# Patient Record
Sex: Female | Born: 1940
Health system: Southern US, Community
[De-identification: ages and names within clinical notes are randomized; demographics above are authoritative.]

## PROBLEM LIST (undated history)

## (undated) DIAGNOSIS — E039 Hypothyroidism, unspecified: Secondary | ICD-10-CM

## (undated) DIAGNOSIS — I10 Essential (primary) hypertension: Secondary | ICD-10-CM

## (undated) DIAGNOSIS — M810 Age-related osteoporosis without current pathological fracture: Secondary | ICD-10-CM

## (undated) DIAGNOSIS — R0602 Shortness of breath: Secondary | ICD-10-CM

## (undated) DIAGNOSIS — R112 Nausea with vomiting, unspecified: Secondary | ICD-10-CM

## (undated) DIAGNOSIS — C801 Malignant (primary) neoplasm, unspecified: Secondary | ICD-10-CM

## (undated) DIAGNOSIS — M109 Gout, unspecified: Secondary | ICD-10-CM

## (undated) DIAGNOSIS — N390 Urinary tract infection, site not specified: Secondary | ICD-10-CM

## (undated) DIAGNOSIS — Z8719 Personal history of other diseases of the digestive system: Secondary | ICD-10-CM

## (undated) DIAGNOSIS — N189 Chronic kidney disease, unspecified: Secondary | ICD-10-CM

## (undated) DIAGNOSIS — K589 Irritable bowel syndrome without diarrhea: Secondary | ICD-10-CM

## (undated) DIAGNOSIS — D649 Anemia, unspecified: Secondary | ICD-10-CM

## (undated) DIAGNOSIS — H9190 Unspecified hearing loss, unspecified ear: Secondary | ICD-10-CM

## (undated) DIAGNOSIS — E785 Hyperlipidemia, unspecified: Secondary | ICD-10-CM

## (undated) DIAGNOSIS — M1990 Unspecified osteoarthritis, unspecified site: Secondary | ICD-10-CM

## (undated) DIAGNOSIS — Z9889 Other specified postprocedural states: Secondary | ICD-10-CM

## (undated) HISTORY — PX: ACHILLES TENDON SURGERY: SHX542

## (undated) HISTORY — PX: EYE SURGERY: SHX253

## (undated) HISTORY — PX: DILATION AND CURETTAGE OF UTERUS: SHX78

## (undated) HISTORY — DX: Gout, unspecified: M10.9

## (undated) HISTORY — PX: BREAST LUMPECTOMY: SHX2

## (undated) HISTORY — DX: Age-related osteoporosis without current pathological fracture: M81.0

## (undated) HISTORY — PX: TONSILLECTOMY: SUR1361

## (undated) HISTORY — PX: SKIN CANCER EXCISION: SHX779

---

## 2008-05-29 ENCOUNTER — Encounter (HOSPITAL_BASED_OUTPATIENT_CLINIC_OR_DEPARTMENT_OTHER): Payer: Self-pay | Admitting: General Surgery

## 2008-05-29 ENCOUNTER — Ambulatory Visit (HOSPITAL_COMMUNITY): Admission: RE | Admit: 2008-05-29 | Discharge: 2008-05-29 | Payer: Self-pay | Admitting: General Surgery

## 2008-06-25 ENCOUNTER — Ambulatory Visit (HOSPITAL_BASED_OUTPATIENT_CLINIC_OR_DEPARTMENT_OTHER): Payer: Medicare Other | Admitting: Oncology

## 2008-07-04 ENCOUNTER — Ambulatory Visit: Admission: RE | Admit: 2008-07-04 | Discharge: 2008-09-15 | Payer: Self-pay | Admitting: Radiation Oncology

## 2008-07-09 LAB — CBC WITH DIFFERENTIAL/PLATELET
BASO%: 0.5 % (ref 0.0–2.0)
EOS%: 3.5 % (ref 0.0–7.0)
MCH: 29.7 pg (ref 26.0–34.0)
MCHC: 34.1 g/dL (ref 32.0–36.0)
RBC: 3.64 10*6/uL — ABNORMAL LOW (ref 3.70–5.32)
RDW: 14.9 % — ABNORMAL HIGH (ref 11.3–14.5)
lymph#: 1.5 10*3/uL (ref 0.9–3.3)

## 2008-07-10 LAB — LACTATE DEHYDROGENASE: LDH: 176 U/L (ref 94–250)

## 2008-07-10 LAB — COMPREHENSIVE METABOLIC PANEL
ALT: 14 U/L (ref 0–35)
AST: 12 U/L (ref 0–37)
CO2: 23 mEq/L (ref 19–32)
Creatinine, Ser: 1.39 mg/dL — ABNORMAL HIGH (ref 0.40–1.20)
Sodium: 138 mEq/L (ref 135–145)
Total Bilirubin: 0.3 mg/dL (ref 0.3–1.2)
Total Protein: 7.2 g/dL (ref 6.0–8.3)

## 2008-07-16 ENCOUNTER — Ambulatory Visit (HOSPITAL_COMMUNITY): Admission: RE | Admit: 2008-07-16 | Discharge: 2008-07-16 | Payer: Self-pay | Admitting: Oncology

## 2008-10-17 ENCOUNTER — Ambulatory Visit: Payer: Self-pay | Admitting: Oncology

## 2008-12-09 ENCOUNTER — Ambulatory Visit: Payer: Self-pay | Admitting: Oncology

## 2009-02-25 ENCOUNTER — Encounter: Admission: RE | Admit: 2009-02-25 | Discharge: 2009-02-25 | Payer: Self-pay | Admitting: Endocrinology

## 2009-04-07 ENCOUNTER — Ambulatory Visit: Payer: Self-pay | Admitting: Oncology

## 2009-04-09 LAB — CBC WITH DIFFERENTIAL/PLATELET
Basophils Absolute: 0 10*3/uL (ref 0.0–0.1)
Eosinophils Absolute: 0.1 10*3/uL (ref 0.0–0.5)
HCT: 32.1 % — ABNORMAL LOW (ref 34.8–46.6)
HGB: 11.3 g/dL — ABNORMAL LOW (ref 11.6–15.9)
MCV: 84.7 fL (ref 79.5–101.0)
MONO%: 11 % (ref 0.0–14.0)
NEUT#: 3.6 10*3/uL (ref 1.5–6.5)
Platelets: 282 10*3/uL (ref 145–400)
RDW: 15.9 % — ABNORMAL HIGH (ref 11.2–14.5)

## 2009-04-09 LAB — COMPREHENSIVE METABOLIC PANEL
Albumin: 3.5 g/dL (ref 3.5–5.2)
Alkaline Phosphatase: 64 U/L (ref 39–117)
BUN: 33 mg/dL — ABNORMAL HIGH (ref 6–23)
Calcium: 9.8 mg/dL (ref 8.4–10.5)
Chloride: 104 mEq/L (ref 96–112)
Glucose, Bld: 118 mg/dL — ABNORMAL HIGH (ref 70–99)
Potassium: 4.4 mEq/L (ref 3.5–5.3)

## 2009-04-10 LAB — CANCER ANTIGEN 27.29: CA 27.29: 48 U/mL — ABNORMAL HIGH (ref 0–39)

## 2009-04-10 LAB — VITAMIN D 25 HYDROXY (VIT D DEFICIENCY, FRACTURES): Vit D, 25-Hydroxy: 29 ng/mL — ABNORMAL LOW (ref 30–89)

## 2009-09-16 ENCOUNTER — Encounter: Admission: RE | Admit: 2009-09-16 | Discharge: 2009-10-28 | Payer: Self-pay | Admitting: General Surgery

## 2009-10-12 ENCOUNTER — Ambulatory Visit: Payer: Self-pay | Admitting: Oncology

## 2009-10-14 LAB — COMPREHENSIVE METABOLIC PANEL
ALT: 22 U/L (ref 0–35)
AST: 28 U/L (ref 0–37)
Alkaline Phosphatase: 68 U/L (ref 39–117)
Sodium: 136 mEq/L (ref 135–145)
Total Bilirubin: 0.7 mg/dL (ref 0.3–1.2)
Total Protein: 7.9 g/dL (ref 6.0–8.3)

## 2009-10-14 LAB — CBC WITH DIFFERENTIAL/PLATELET
BASO%: 0.5 % (ref 0.0–2.0)
EOS%: 2.6 % (ref 0.0–7.0)
LYMPH%: 21.4 % (ref 14.0–49.7)
MCHC: 33.6 g/dL (ref 31.5–36.0)
MCV: 87.3 fL (ref 79.5–101.0)
MONO%: 8.7 % (ref 0.0–14.0)
Platelets: 251 10*3/uL (ref 145–400)
RBC: 3.91 10*6/uL (ref 3.70–5.45)
RDW: 15.3 % — ABNORMAL HIGH (ref 11.2–14.5)

## 2009-10-15 LAB — CANCER ANTIGEN 27.29: CA 27.29: 41 U/mL — ABNORMAL HIGH (ref 0–39)

## 2009-10-31 ENCOUNTER — Encounter: Admission: RE | Admit: 2009-10-31 | Discharge: 2009-11-12 | Payer: Self-pay | Admitting: General Surgery

## 2010-04-14 ENCOUNTER — Ambulatory Visit: Payer: Self-pay | Admitting: Oncology

## 2010-04-16 LAB — COMPREHENSIVE METABOLIC PANEL
AST: 25 U/L (ref 0–37)
Albumin: 3.9 g/dL (ref 3.5–5.2)
Alkaline Phosphatase: 65 U/L (ref 39–117)
BUN: 37 mg/dL — ABNORMAL HIGH (ref 6–23)
Creatinine, Ser: 1.4 mg/dL — ABNORMAL HIGH (ref 0.40–1.20)
Glucose, Bld: 116 mg/dL — ABNORMAL HIGH (ref 70–99)
Potassium: 4.8 mEq/L (ref 3.5–5.3)
Total Bilirubin: 0.3 mg/dL (ref 0.3–1.2)

## 2010-04-16 LAB — CBC WITH DIFFERENTIAL/PLATELET
Basophils Absolute: 0 10*3/uL (ref 0.0–0.1)
EOS%: 0.5 % (ref 0.0–7.0)
Eosinophils Absolute: 0 10*3/uL (ref 0.0–0.5)
HGB: 11 g/dL — ABNORMAL LOW (ref 11.6–15.9)
LYMPH%: 25.4 % (ref 14.0–49.7)
MCH: 29.6 pg (ref 25.1–34.0)
MCV: 85.2 fL (ref 79.5–101.0)
MONO%: 11.6 % (ref 0.0–14.0)
NEUT#: 4.4 10*3/uL (ref 1.5–6.5)
NEUT%: 62.3 % (ref 38.4–76.8)
Platelets: 310 10*3/uL (ref 145–400)
RDW: 15.2 % — ABNORMAL HIGH (ref 11.2–14.5)

## 2010-04-16 LAB — VITAMIN D 25 HYDROXY (VIT D DEFICIENCY, FRACTURES): Vit D, 25-Hydroxy: 29 ng/mL — ABNORMAL LOW (ref 30–89)

## 2010-04-16 LAB — CANCER ANTIGEN 27.29: CA 27.29: 43 U/mL — ABNORMAL HIGH (ref 0–39)

## 2010-11-12 ENCOUNTER — Ambulatory Visit: Payer: Self-pay | Admitting: Oncology

## 2010-11-16 LAB — CBC WITH DIFFERENTIAL/PLATELET
BASO%: 0.3 % (ref 0.0–2.0)
Basophils Absolute: 0 10*3/uL (ref 0.0–0.1)
EOS%: 1.9 % (ref 0.0–7.0)
Eosinophils Absolute: 0.1 10*3/uL (ref 0.0–0.5)
HCT: 31.4 % — ABNORMAL LOW (ref 34.8–46.6)
HGB: 10.8 g/dL — ABNORMAL LOW (ref 11.6–15.9)
LYMPH%: 25.4 % (ref 14.0–49.7)
MCH: 29.2 pg (ref 25.1–34.0)
MCHC: 34.3 g/dL (ref 31.5–36.0)
MCV: 85.3 fL (ref 79.5–101.0)
MONO#: 0.5 10*3/uL (ref 0.1–0.9)
MONO%: 9.8 % (ref 0.0–14.0)
NEUT#: 3.4 10*3/uL (ref 1.5–6.5)
NEUT%: 62.6 % (ref 38.4–76.8)
Platelets: 243 10*3/uL (ref 145–400)
RBC: 3.69 10*6/uL — ABNORMAL LOW (ref 3.70–5.45)
RDW: 16.2 % — ABNORMAL HIGH (ref 11.2–14.5)
WBC: 5.4 10*3/uL (ref 3.9–10.3)
lymph#: 1.4 10*3/uL (ref 0.9–3.3)

## 2010-11-17 LAB — COMPREHENSIVE METABOLIC PANEL
ALT: 14 U/L (ref 0–35)
AST: 16 U/L (ref 0–37)
Albumin: 4 g/dL (ref 3.5–5.2)
Alkaline Phosphatase: 79 U/L (ref 39–117)
BUN: 24 mg/dL — ABNORMAL HIGH (ref 6–23)
CO2: 26 mEq/L (ref 19–32)
Calcium: 10.2 mg/dL (ref 8.4–10.5)
Chloride: 105 mEq/L (ref 96–112)
Creatinine, Ser: 1.29 mg/dL — ABNORMAL HIGH (ref 0.40–1.20)
Glucose, Bld: 103 mg/dL — ABNORMAL HIGH (ref 70–99)
Potassium: 4.5 mEq/L (ref 3.5–5.3)
Sodium: 140 mEq/L (ref 135–145)
Total Bilirubin: 0.4 mg/dL (ref 0.3–1.2)
Total Protein: 7.1 g/dL (ref 6.0–8.3)

## 2010-11-17 LAB — LACTATE DEHYDROGENASE: LDH: 231 U/L (ref 94–250)

## 2010-11-17 LAB — VITAMIN D 25 HYDROXY (VIT D DEFICIENCY, FRACTURES): Vit D, 25-Hydroxy: 25 ng/mL — ABNORMAL LOW (ref 30–89)

## 2010-11-17 LAB — CANCER ANTIGEN 27.29: CA 27.29: 35 U/mL (ref 0–39)

## 2010-11-22 ENCOUNTER — Encounter (HOSPITAL_BASED_OUTPATIENT_CLINIC_OR_DEPARTMENT_OTHER): Payer: Self-pay | Admitting: General Surgery

## 2010-12-06 ENCOUNTER — Encounter: Payer: Medicare Other | Admitting: Oncology

## 2010-12-06 DIAGNOSIS — C50219 Malignant neoplasm of upper-inner quadrant of unspecified female breast: Secondary | ICD-10-CM

## 2010-12-06 DIAGNOSIS — Z170 Estrogen receptor positive status [ER+]: Secondary | ICD-10-CM

## 2011-03-15 NOTE — Op Note (Signed)
Autumn Chambers, Autumn Chambers             ACCOUNT NO.:  [id number]   MEDICAL RECORD NO.:  [id number]          PATIENT TYPE:  AMB   LOCATION:  SDS                          FACILITY:  [facility]   PHYSICIAN:  Leonie Man, M.D.   DATE OF BIRTH:  1941/03/31   DATE OF PROCEDURE:  05/29/2008  DATE OF DISCHARGE:                               OPERATIVE REPORT   PREOPERATIVE DIAGNOSIS:  Carcinoma of left breast.   POSTOPERATIVE DIAGNOSIS:  Carcinoma of left breast.   PROCEDURE:  Blue dye injection with needle-localized left lumpectomy and  left sentinel lymph node biopsy.   SURGEON:  Leonie Man, MD.   ASSISTANT:  OR nurse.   ANESTHESIA:  General.   SURGICAL FINDINGS:  Left axillary sentinel nodes x2, negative for touch  prep for metastatic carcinoma.  Excision of left breast lesion with a  clip and wire intact margins are pending.   SPECIMENS TO PATHOLOGY:  Breast tissue and left axillary nodes.   ESTIMATED BLOOD LOSS:  Minimal.   COMPLICATIONS:  None.   The patient returned to PACU in excellent condition.   INDICATIONS:  Autumn Chambers is a 70 year old nulliparous female with a  screen detected left-sided breast lesion which on biopsy shows an  invasive mammary carcinoma.  The patient comes to the operating room now  after risks and potential benefits of surgery have been fully discussed.  All questions answered.  Consent for surgery obtained.   Following induction of satisfactory general anesthesia and following the  injection of technetium sulfur colloid by the Radiology Department, I  prepped the periareolar region on the left side and infiltrated that  area with diluted methylene blue solution and this was massaged into the  breast tissues.  The left breast and axilla were then prepped and draped  to be included in a sterile operative field.  The patient was identified  as Autumn Chambers.  Operation to be done is blue dye injection and  needle-localized left lumpectomy with  sentinel lymph node biopsy.  All  surgical preoperative precautions were verified.  On looking at the  breast, her prior needle-localized site had an area of erythema and a  small skin ulceration around it.  I chose to take this along with an  ellipse of skin over the localizing needle and this skin was removed and  discarded.  A superior medial flap and the lateral flap were raised  around the needle and the breast tissue taken a wide wedge around the  localizing needle taking this all the way down to the pectoralis major  muscle.  This was removed in its entirety and forwarded for specimen  mammography.  Consultation with Dr. Yolanda Bonine was noted to be quite  adequate.  The breast wound was then packed, and attention was then  turned to the left axillary area with the use of the NeoProbe.  A hot  spot within the axilla was identified.  I made a transverse axillary  incision, deepened this through skin and subcutaneous tissues and  dissecting down to the axilla.  Two hot but not blue sentinel nodes were  encountered.  These  were removed and forwarded for pathologic  evaluation.  Touch preps of these nodes did not show any metastatic  carcinoma.  Additional searching within the axilla for hot spots did not  reveal any additional spots with high counts above background.  Both the  axillary and breast wounds were thoroughly irrigated with normal saline.  Sponge and instrument counts were doubly verified.  The breast wound was  closed in 2 layers with interrupted 3-0 Vicryl sutures to close the  breast tissue and subcutaneous tissues and the skin was closed with a 6-  0 Monocryl reinforced with Dermabond and Steri-Strips.  The axillary  room was similarly closed with interrupted 3-0 Vicryl sutures and then  with a 6-0 Monocryl suture.  Dermabond and Steri-Strips applied.  Sterile dressings applied to both wounds.  The anesthetic reversed.  The  patient removed from the operating room to the  recovery room in stable  condition.  She tolerated the procedure well.      Leonie Man, M.D.  Electronically Signed     PB/MEDQ  D:  05/29/2008  T:  05/30/2008  Job:  [id number]

## 2011-05-31 ENCOUNTER — Encounter (HOSPITAL_BASED_OUTPATIENT_CLINIC_OR_DEPARTMENT_OTHER): Payer: Medicare Other | Admitting: Oncology

## 2011-05-31 ENCOUNTER — Other Ambulatory Visit: Payer: Self-pay | Admitting: Oncology

## 2011-05-31 DIAGNOSIS — Z170 Estrogen receptor positive status [ER+]: Secondary | ICD-10-CM

## 2011-05-31 DIAGNOSIS — C50219 Malignant neoplasm of upper-inner quadrant of unspecified female breast: Secondary | ICD-10-CM

## 2011-05-31 LAB — CBC WITH DIFFERENTIAL/PLATELET
BASO%: 0.3 % (ref 0.0–2.0)
Basophils Absolute: 0 10*3/uL (ref 0.0–0.1)
EOS%: 2.1 % (ref 0.0–7.0)
Eosinophils Absolute: 0.1 10*3/uL (ref 0.0–0.5)
HCT: 31.7 % — ABNORMAL LOW (ref 34.8–46.6)
HGB: 10.7 g/dL — ABNORMAL LOW (ref 11.6–15.9)
LYMPH%: 20.1 % (ref 14.0–49.7)
MCH: 29.8 pg (ref 25.1–34.0)
MCHC: 33.9 g/dL (ref 31.5–36.0)
MCV: 87.7 fL (ref 79.5–101.0)
MONO#: 0.5 10*3/uL (ref 0.1–0.9)
MONO%: 9.2 % (ref 0.0–14.0)
NEUT#: 3.8 10*3/uL (ref 1.5–6.5)
NEUT%: 68.3 % (ref 38.4–76.8)
Platelets: 235 10*3/uL (ref 145–400)
RBC: 3.61 10*6/uL — ABNORMAL LOW (ref 3.70–5.45)
RDW: 14.3 % (ref 11.2–14.5)
WBC: 5.6 10*3/uL (ref 3.9–10.3)
lymph#: 1.1 10*3/uL (ref 0.9–3.3)

## 2011-05-31 LAB — COMPREHENSIVE METABOLIC PANEL
AST: 18 U/L (ref 0–37)
Albumin: 3.7 g/dL (ref 3.5–5.2)
Alkaline Phosphatase: 78 U/L (ref 39–117)
Calcium: 9.2 mg/dL (ref 8.4–10.5)
Chloride: 106 mEq/L (ref 96–112)
Glucose, Bld: 140 mg/dL — ABNORMAL HIGH (ref 70–99)
Potassium: 4.4 mEq/L (ref 3.5–5.3)
Sodium: 137 mEq/L (ref 135–145)
Total Protein: 6.8 g/dL (ref 6.0–8.3)

## 2011-05-31 LAB — LACTATE DEHYDROGENASE: LDH: 173 U/L (ref 94–250)

## 2011-05-31 LAB — VITAMIN D 25 HYDROXY (VIT D DEFICIENCY, FRACTURES): Vit D, 25-Hydroxy: 21 ng/mL — ABNORMAL LOW (ref 30–89)

## 2011-06-07 ENCOUNTER — Encounter: Payer: Medicare Other | Admitting: Oncology

## 2011-07-29 LAB — URINALYSIS, ROUTINE W REFLEX MICROSCOPIC
Bilirubin Urine: NEGATIVE
Glucose, UA: NEGATIVE
Ketones, ur: NEGATIVE
pH: 6

## 2011-07-29 LAB — CBC
HCT: 34.4 — ABNORMAL LOW
Hemoglobin: 11.5 — ABNORMAL LOW
MCHC: 33.4
MCV: 88
RBC: 3.91
WBC: 7.1

## 2011-07-29 LAB — DIFFERENTIAL
Basophils Relative: 0
Eosinophils Absolute: 0.2
Monocytes Absolute: 0.8
Monocytes Relative: 11
Neutrophils Relative %: 57

## 2011-07-29 LAB — COMPREHENSIVE METABOLIC PANEL
BUN: 17
CO2: 26
Calcium: 9.4
Chloride: 111
Creatinine, Ser: 1.31 — ABNORMAL HIGH
GFR calc Af Amer: 49 — ABNORMAL LOW
GFR calc non Af Amer: 41 — ABNORMAL LOW
Glucose, Bld: 102 — ABNORMAL HIGH
Total Bilirubin: 0.6

## 2011-11-02 DIAGNOSIS — H40009 Preglaucoma, unspecified, unspecified eye: Secondary | ICD-10-CM | POA: Diagnosis not present

## 2011-11-02 DIAGNOSIS — E119 Type 2 diabetes mellitus without complications: Secondary | ICD-10-CM | POA: Diagnosis not present

## 2011-11-02 DIAGNOSIS — Z961 Presence of intraocular lens: Secondary | ICD-10-CM | POA: Diagnosis not present

## 2011-11-04 ENCOUNTER — Other Ambulatory Visit: Payer: Self-pay | Admitting: *Deleted

## 2011-11-04 DIAGNOSIS — C50419 Malignant neoplasm of upper-outer quadrant of unspecified female breast: Secondary | ICD-10-CM

## 2011-11-04 MED ORDER — TAMOXIFEN CITRATE 20 MG PO TABS: 20.0000 mg | ORAL_TABLET | Freq: Every day | ORAL | Status: AC

## 2011-11-04 NOTE — Telephone Encounter (Signed)
Pt. Called for refill of Arimidex.  Discussed with Dr. Donnie Coffin.  Pt. Had arimidex filled #90  On 11/05/10.  She then saw Dr. Donnie Coffin on 12/06/10 and he changed her to tamoxifen which she had filled on that date #90.  She has not had medications refilled since then, but thinks she is still on the arimidex.  Called pt. And per Dr. Donnie Coffin, she should be on the tamoxifen.  She verbalized understanding and script for tamoxifen called in.

## 2011-11-09 DIAGNOSIS — E669 Obesity, unspecified: Secondary | ICD-10-CM | POA: Diagnosis not present

## 2011-11-09 DIAGNOSIS — I10 Essential (primary) hypertension: Secondary | ICD-10-CM | POA: Diagnosis not present

## 2011-11-09 DIAGNOSIS — E039 Hypothyroidism, unspecified: Secondary | ICD-10-CM | POA: Diagnosis not present

## 2011-11-09 DIAGNOSIS — E119 Type 2 diabetes mellitus without complications: Secondary | ICD-10-CM | POA: Diagnosis not present

## 2011-11-28 DIAGNOSIS — M1710 Unilateral primary osteoarthritis, unspecified knee: Secondary | ICD-10-CM | POA: Diagnosis not present

## 2011-12-10 DIAGNOSIS — M1990 Unspecified osteoarthritis, unspecified site: Secondary | ICD-10-CM | POA: Diagnosis not present

## 2011-12-10 DIAGNOSIS — Z7901 Long term (current) use of anticoagulants: Secondary | ICD-10-CM | POA: Diagnosis not present

## 2011-12-19 ENCOUNTER — Other Ambulatory Visit (HOSPITAL_COMMUNITY): Payer: Self-pay | Admitting: Orthopedic Surgery

## 2011-12-21 ENCOUNTER — Encounter (HOSPITAL_COMMUNITY): Payer: Self-pay | Admitting: Pharmacy Technician

## 2011-12-21 ENCOUNTER — Encounter (HOSPITAL_COMMUNITY)
Admission: RE | Admit: 2011-12-21 | Discharge: 2011-12-21 | Disposition: A | Discharge status: Home / Self Care | Payer: Medicare Other | Source: Ambulatory Visit | Attending: Orthopedic Surgery | Admitting: Orthopedic Surgery

## 2011-12-21 ENCOUNTER — Encounter (HOSPITAL_COMMUNITY): Payer: Self-pay

## 2011-12-21 ENCOUNTER — Other Ambulatory Visit: Payer: Self-pay

## 2011-12-21 DIAGNOSIS — M1710 Unilateral primary osteoarthritis, unspecified knee: Secondary | ICD-10-CM | POA: Diagnosis not present

## 2011-12-21 DIAGNOSIS — M47814 Spondylosis without myelopathy or radiculopathy, thoracic region: Secondary | ICD-10-CM | POA: Diagnosis not present

## 2011-12-21 DIAGNOSIS — E039 Hypothyroidism, unspecified: Secondary | ICD-10-CM | POA: Diagnosis not present

## 2011-12-21 DIAGNOSIS — Z01811 Encounter for preprocedural respiratory examination: Secondary | ICD-10-CM | POA: Diagnosis not present

## 2011-12-21 DIAGNOSIS — I10 Essential (primary) hypertension: Secondary | ICD-10-CM | POA: Diagnosis not present

## 2011-12-21 DIAGNOSIS — E119 Type 2 diabetes mellitus without complications: Secondary | ICD-10-CM | POA: Diagnosis not present

## 2011-12-21 HISTORY — DX: Hyperlipidemia, unspecified: E78.5

## 2011-12-21 HISTORY — DX: Personal history of other diseases of the digestive system: Z87.19

## 2011-12-21 HISTORY — DX: Other specified postprocedural states: Z98.890

## 2011-12-21 HISTORY — DX: Malignant (primary) neoplasm, unspecified: C80.1

## 2011-12-21 HISTORY — DX: Irritable bowel syndrome, unspecified: K58.9

## 2011-12-21 HISTORY — DX: Urinary tract infection, site not specified: N39.0

## 2011-12-21 HISTORY — DX: Shortness of breath: R06.02

## 2011-12-21 HISTORY — DX: Hypothyroidism, unspecified: E03.9

## 2011-12-21 HISTORY — DX: Essential (primary) hypertension: I10

## 2011-12-21 HISTORY — DX: Unspecified osteoarthritis, unspecified site: M19.90

## 2011-12-21 HISTORY — DX: Anemia, unspecified: D64.9

## 2011-12-21 HISTORY — DX: Other specified postprocedural states: R11.2

## 2011-12-21 LAB — COMPREHENSIVE METABOLIC PANEL
AST: 17 U/L (ref 0–37)
CO2: 23 mEq/L (ref 19–32)
Calcium: 9.4 mg/dL (ref 8.4–10.5)
Creatinine, Ser: 1.43 mg/dL — ABNORMAL HIGH (ref 0.50–1.10)
GFR calc Af Amer: 42 mL/min — ABNORMAL LOW (ref >90)
GFR calc non Af Amer: 36 mL/min — ABNORMAL LOW (ref >90)
Sodium: 136 mEq/L (ref 135–145)
Total Protein: 7.5 g/dL (ref 6.0–8.3)

## 2011-12-21 LAB — CBC
MCH: 29.8 pg (ref 26.0–34.0)
MCHC: 32.2 g/dL (ref 30.0–36.0)
MCV: 92.5 fL (ref 78.0–100.0)
Platelets: 257 10*3/uL (ref 150–400)
RBC: 3.46 MIL/uL — ABNORMAL LOW (ref 3.87–5.11)

## 2011-12-21 LAB — SURGICAL PCR SCREEN
MRSA, PCR: NEGATIVE
Staphylococcus aureus: POSITIVE — AB

## 2011-12-21 LAB — TYPE AND SCREEN: Antibody Screen: NEGATIVE

## 2011-12-21 LAB — PROTIME-INR: INR: 1.06 (ref 0.00–1.49)

## 2011-12-21 NOTE — Pre-Procedure Instructions (Signed)
20 Autumn Chambers  12/21/2011   Your procedure is scheduled on:  Wednesday January 04, 2012  Report to [facility] at 0845 AM.  Call this number if you have problems the morning of surgery: [phone]   Remember:   Do not eat food:After Midnight.  May have clear liquids: up to 4 Hours before arrival. (up to 4:45am)  Clear liquids include soda, tea, black coffee, apple or grape juice, broth.  Take these medicines the morning of surgery with A SIP OF WATER: none   Do not wear jewelry, make-up or nail polish.  Do not wear lotions, powders, or perfumes. You may wear deodorant.  Do not shave 48 hours prior to surgery.  Do not bring valuables to the hospital.  Contacts, dentures or bridgework may not be worn into surgery.  Leave suitcase in the car. After surgery it may be brought to your room.  For patients admitted to the hospital, checkout time is 11:00 AM the day of discharge.   Patients discharged the day of surgery will not be allowed to drive home.  Name and phone number of your driver: [facility] [phone]  Special Instructions: CHG Shower Use Special Wash: 1/2 bottle night before surgery and 1/2 bottle morning of surgery.   Please read over the following fact sheets that you were given: Pain Booklet, Coughing and Deep Breathing, Blood Transfusion Information, Total Joint Packet, MRSA Information and Surgical Site Infection Prevention

## 2011-12-26 ENCOUNTER — Telehealth: Payer: Self-pay | Admitting: Oncology

## 2011-12-26 DIAGNOSIS — E039 Hypothyroidism, unspecified: Secondary | ICD-10-CM | POA: Diagnosis not present

## 2011-12-26 NOTE — Telephone Encounter (Signed)
lmonvm adviisng the pt of her r/s appts from 12/29/2011 to 01/03/2012 due to the md's schedule

## 2011-12-29 ENCOUNTER — Ambulatory Visit: Payer: Medicare Other | Admitting: Oncology

## 2011-12-29 ENCOUNTER — Other Ambulatory Visit: Payer: Medicare Other | Admitting: Lab

## 2012-01-03 ENCOUNTER — Ambulatory Visit: Payer: BC Managed Care – PPO | Admitting: Physician Assistant

## 2012-01-03 ENCOUNTER — Other Ambulatory Visit: Payer: BC Managed Care – PPO | Admitting: Lab

## 2012-01-03 MED ORDER — CEFAZOLIN SODIUM-DEXTROSE 2-3 GM-% IV SOLR
2.0000 g | INTRAVENOUS | Status: AC
  Administered 2012-01-04: 2 g via INTRAVENOUS
  Filled 2012-01-03: qty 50

## 2012-01-04 ENCOUNTER — Ambulatory Visit (HOSPITAL_COMMUNITY): Payer: Medicare Other

## 2012-01-04 ENCOUNTER — Ambulatory Visit (HOSPITAL_COMMUNITY): Payer: Medicare Other | Admitting: Anesthesiology

## 2012-01-04 ENCOUNTER — Encounter (HOSPITAL_COMMUNITY): Payer: Self-pay | Admitting: *Deleted

## 2012-01-04 ENCOUNTER — Encounter (HOSPITAL_COMMUNITY): Payer: Self-pay | Admitting: Anesthesiology

## 2012-01-04 ENCOUNTER — Encounter (HOSPITAL_COMMUNITY)
Admission: RE | Disposition: A | Discharge status: Skilled Nursing Facility | Payer: Self-pay | Source: Ambulatory Visit | Attending: Orthopedic Surgery

## 2012-01-04 ENCOUNTER — Inpatient Hospital Stay (HOSPITAL_COMMUNITY)
Admission: RE | Admit: 2012-01-04 | Discharge: 2012-01-07 | DRG: 470 | Disposition: A | Discharge status: Skilled Nursing Facility | Payer: Medicare Other | Source: Ambulatory Visit | Attending: Orthopedic Surgery | Admitting: Orthopedic Surgery

## 2012-01-04 DIAGNOSIS — E119 Type 2 diabetes mellitus without complications: Secondary | ICD-10-CM | POA: Diagnosis present

## 2012-01-04 DIAGNOSIS — F411 Generalized anxiety disorder: Secondary | ICD-10-CM | POA: Diagnosis not present

## 2012-01-04 DIAGNOSIS — Z96659 Presence of unspecified artificial knee joint: Secondary | ICD-10-CM | POA: Diagnosis not present

## 2012-01-04 DIAGNOSIS — Z471 Aftercare following joint replacement surgery: Secondary | ICD-10-CM | POA: Diagnosis not present

## 2012-01-04 DIAGNOSIS — Z01812 Encounter for preprocedural laboratory examination: Secondary | ICD-10-CM | POA: Diagnosis not present

## 2012-01-04 DIAGNOSIS — M1710 Unilateral primary osteoarthritis, unspecified knee: Secondary | ICD-10-CM | POA: Diagnosis not present

## 2012-01-04 DIAGNOSIS — IMO0002 Reserved for concepts with insufficient information to code with codable children: Secondary | ICD-10-CM | POA: Diagnosis not present

## 2012-01-04 DIAGNOSIS — Z87440 Personal history of urinary (tract) infections: Secondary | ICD-10-CM | POA: Diagnosis not present

## 2012-01-04 DIAGNOSIS — Z85820 Personal history of malignant melanoma of skin: Secondary | ICD-10-CM | POA: Diagnosis not present

## 2012-01-04 DIAGNOSIS — M25569 Pain in unspecified knee: Secondary | ICD-10-CM | POA: Diagnosis not present

## 2012-01-04 DIAGNOSIS — E039 Hypothyroidism, unspecified: Secondary | ICD-10-CM | POA: Diagnosis not present

## 2012-01-04 DIAGNOSIS — E785 Hyperlipidemia, unspecified: Secondary | ICD-10-CM | POA: Diagnosis not present

## 2012-01-04 DIAGNOSIS — I10 Essential (primary) hypertension: Secondary | ICD-10-CM | POA: Diagnosis present

## 2012-01-04 DIAGNOSIS — Z5189 Encounter for other specified aftercare: Secondary | ICD-10-CM | POA: Diagnosis not present

## 2012-01-04 DIAGNOSIS — S8990XA Unspecified injury of unspecified lower leg, initial encounter: Secondary | ICD-10-CM | POA: Diagnosis not present

## 2012-01-04 DIAGNOSIS — G8918 Other acute postprocedural pain: Secondary | ICD-10-CM | POA: Diagnosis not present

## 2012-01-04 DIAGNOSIS — D649 Anemia, unspecified: Secondary | ICD-10-CM | POA: Diagnosis not present

## 2012-01-04 HISTORY — PX: TOTAL KNEE ARTHROPLASTY: SHX125

## 2012-01-04 LAB — GLUCOSE, CAPILLARY: Glucose-Capillary: 133 mg/dL — ABNORMAL HIGH (ref 70–99)

## 2012-01-04 SURGERY — ARTHROPLASTY, KNEE, TOTAL
Anesthesia: Regional | Site: Knee | Laterality: Right | Wound class: Clean

## 2012-01-04 MED ORDER — ESMOLOL HCL 10 MG/ML IV SOLN
INTRAVENOUS | Status: DC | PRN
Start: 2012-01-04 — End: 2012-01-04
  Administered 2012-01-04: 20 mg via INTRAVENOUS
  Administered 2012-01-04: 10 mg via INTRAVENOUS
  Administered 2012-01-04: 20 mg via INTRAVENOUS

## 2012-01-04 MED ORDER — DROPERIDOL 2.5 MG/ML IJ SOLN
INTRAMUSCULAR | Status: DC | PRN
  Administered 2012-01-04: 0.625 mg via INTRAVENOUS

## 2012-01-04 MED ORDER — IRBESARTAN 300 MG PO TABS
300.0000 mg | ORAL_TABLET | Freq: Every day | ORAL | Status: DC
  Administered 2012-01-04 – 2012-01-06 (×3): 300 mg via ORAL
  Filled 2012-01-04 (×4): qty 1

## 2012-01-04 MED ORDER — MIDAZOLAM HCL 2 MG/2ML IJ SOLN
1.0000 mg | INTRAMUSCULAR | Status: DC | PRN
  Administered 2012-01-04: 2 mg via INTRAVENOUS

## 2012-01-04 MED ORDER — BUPIVACAINE-EPINEPHRINE PF 0.5-1:200000 % IJ SOLN
INTRAMUSCULAR | Status: DC | PRN
  Administered 2012-01-04: 30 mL

## 2012-01-04 MED ORDER — LACTATED RINGERS IV SOLN
INTRAVENOUS | Status: DC
  Administered 2012-01-04: 10:00:00 via INTRAVENOUS

## 2012-01-04 MED ORDER — INSULIN ASPART 100 UNIT/ML ~~LOC~~ SOLN
0.0000 [IU] | Freq: Three times a day (TID) | SUBCUTANEOUS | Status: DC
  Administered 2012-01-04 – 2012-01-05 (×2): 3 [IU] via SUBCUTANEOUS
  Administered 2012-01-07: 2 [IU] via SUBCUTANEOUS
  Filled 2012-01-04: qty 3

## 2012-01-04 MED ORDER — METHOCARBAMOL 500 MG PO TABS
500.0000 mg | ORAL_TABLET | Freq: Four times a day (QID) | ORAL | Status: DC | PRN
  Administered 2012-01-04 – 2012-01-07 (×4): 500 mg via ORAL
  Filled 2012-01-04 (×4): qty 1

## 2012-01-04 MED ORDER — OXYCODONE-ACETAMINOPHEN 5-325 MG PO TABS
1.0000 | ORAL_TABLET | ORAL | Status: DC | PRN
  Administered 2012-01-06 – 2012-01-07 (×4): 2 via ORAL
  Filled 2012-01-04 (×4): qty 2

## 2012-01-04 MED ORDER — ONDANSETRON HCL 4 MG/2ML IJ SOLN: 4.0000 mg | Freq: Four times a day (QID) | INTRAMUSCULAR | Status: DC | PRN

## 2012-01-04 MED ORDER — CEFAZOLIN SODIUM-DEXTROSE 2-3 GM-% IV SOLR
2.0000 g | Freq: Four times a day (QID) | INTRAVENOUS | Status: AC
  Administered 2012-01-04 – 2012-01-05 (×3): 2 g via INTRAVENOUS
  Filled 2012-01-04 (×3): qty 50

## 2012-01-04 MED ORDER — MIDAZOLAM HCL 2 MG/2ML IJ SOLN
INTRAMUSCULAR | Status: AC
  Filled 2012-01-04: qty 2

## 2012-01-04 MED ORDER — HYDROMORPHONE HCL PF 1 MG/ML IJ SOLN
0.2500 mg | INTRAMUSCULAR | Status: DC | PRN
  Administered 2012-01-04 (×2): 0.5 mg via INTRAVENOUS

## 2012-01-04 MED ORDER — WARFARIN SODIUM 10 MG PO TABS
10.0000 mg | ORAL_TABLET | Freq: Once | ORAL | Status: AC
  Administered 2012-01-04: 10 mg via ORAL
  Filled 2012-01-04: qty 1

## 2012-01-04 MED ORDER — FENTANYL CITRATE 0.05 MG/ML IJ SOLN
50.0000 ug | INTRAMUSCULAR | Status: DC | PRN
  Administered 2012-01-04: 100 ug via INTRAVENOUS

## 2012-01-04 MED ORDER — GLYCOPYRROLATE 0.2 MG/ML IJ SOLN
INTRAMUSCULAR | Status: DC | PRN
  Administered 2012-01-04: .7 mg via INTRAVENOUS

## 2012-01-04 MED ORDER — HYDROMORPHONE HCL PF 1 MG/ML IJ SOLN
0.5000 mg | INTRAMUSCULAR | Status: DC | PRN
  Administered 2012-01-04: 0.5 mg via INTRAVENOUS
  Filled 2012-01-04: qty 1

## 2012-01-04 MED ORDER — NEOSTIGMINE METHYLSULFATE 1 MG/ML IJ SOLN
INTRAMUSCULAR | Status: DC | PRN
  Administered 2012-01-04: 4 mg via INTRAVENOUS

## 2012-01-04 MED ORDER — LABETALOL HCL 5 MG/ML IV SOLN
INTRAVENOUS | Status: DC | PRN
  Administered 2012-01-04 (×3): 5 mg via INTRAVENOUS

## 2012-01-04 MED ORDER — TRIAMTERENE-HCTZ 37.5-25 MG PO TABS
1.0000 | ORAL_TABLET | Freq: Every day | ORAL | Status: DC
  Administered 2012-01-05 – 2012-01-07 (×3): 1 via ORAL
  Filled 2012-01-04 (×3): qty 1

## 2012-01-04 MED ORDER — FENTANYL CITRATE 0.05 MG/ML IJ SOLN
INTRAMUSCULAR | Status: DC | PRN
  Administered 2012-01-04: 50 ug via INTRAVENOUS

## 2012-01-04 MED ORDER — FENTANYL CITRATE 0.05 MG/ML IJ SOLN
INTRAMUSCULAR | Status: AC
  Filled 2012-01-04: qty 2

## 2012-01-04 MED ORDER — METOCLOPRAMIDE HCL 5 MG/ML IJ SOLN: 5.0000 mg | Freq: Three times a day (TID) | INTRAMUSCULAR | Status: DC | PRN

## 2012-01-04 MED ORDER — PROPOFOL 10 MG/ML IV EMUL
INTRAVENOUS | Status: DC | PRN
  Administered 2012-01-04: 180 mg via INTRAVENOUS

## 2012-01-04 MED ORDER — METHOCARBAMOL 100 MG/ML IJ SOLN
500.0000 mg | Freq: Four times a day (QID) | INTRAVENOUS | Status: DC | PRN
  Filled 2012-01-04 (×2): qty 5

## 2012-01-04 MED ORDER — SODIUM CHLORIDE 0.9 % IV SOLN
INTRAVENOUS | Status: DC
  Administered 2012-01-04: 16:00:00 via INTRAVENOUS

## 2012-01-04 MED ORDER — FERROUS SULFATE 325 (65 FE) MG PO TABS
325.0000 mg | ORAL_TABLET | Freq: Three times a day (TID) | ORAL | Status: DC
  Administered 2012-01-04 – 2012-01-07 (×9): 325 mg via ORAL
  Filled 2012-01-04 (×11): qty 1

## 2012-01-04 MED ORDER — ROCURONIUM BROMIDE 100 MG/10ML IV SOLN
INTRAVENOUS | Status: DC | PRN
  Administered 2012-01-04: 50 mg via INTRAVENOUS

## 2012-01-04 MED ORDER — WARFARIN - PHARMACIST DOSING INPATIENT: Freq: Every day | Status: DC

## 2012-01-04 MED ORDER — COUMADIN BOOK
Freq: Once | Status: AC
  Administered 2012-01-04: 16:00:00
  Filled 2012-01-04: qty 1

## 2012-01-04 MED ORDER — ONDANSETRON HCL 4 MG/2ML IJ SOLN
INTRAMUSCULAR | Status: DC | PRN
  Administered 2012-01-04: 4 mg via INTRAVENOUS

## 2012-01-04 MED ORDER — LINAGLIPTIN 5 MG PO TABS
5.0000 mg | ORAL_TABLET | Freq: Every day | ORAL | Status: DC
  Administered 2012-01-05 – 2012-01-07 (×3): 5 mg via ORAL
  Filled 2012-01-04 (×4): qty 1

## 2012-01-04 MED ORDER — DIAZEPAM 2 MG PO TABS
2.0000 mg | ORAL_TABLET | Freq: Every day | ORAL | Status: DC
  Administered 2012-01-05 – 2012-01-07 (×3): 2 mg via ORAL
  Filled 2012-01-04 (×3): qty 1

## 2012-01-04 MED ORDER — LEVOTHYROXINE SODIUM 100 MCG PO TABS
100.0000 ug | ORAL_TABLET | Freq: Every day | ORAL | Status: DC
  Administered 2012-01-04 – 2012-01-07 (×4): 100 ug via ORAL
  Filled 2012-01-04 (×5): qty 1

## 2012-01-04 MED ORDER — HYDROCODONE-ACETAMINOPHEN 5-325 MG PO TABS
1.0000 | ORAL_TABLET | ORAL | Status: DC | PRN
  Administered 2012-01-05 – 2012-01-06 (×5): 2 via ORAL
  Filled 2012-01-04 (×5): qty 2

## 2012-01-04 MED ORDER — TAMOXIFEN CITRATE 10 MG PO TABS
20.0000 mg | ORAL_TABLET | Freq: Every day | ORAL | Status: DC
  Administered 2012-01-05 – 2012-01-07 (×3): 20 mg via ORAL
  Filled 2012-01-04 (×4): qty 2

## 2012-01-04 MED ORDER — SODIUM CHLORIDE 0.9 % IR SOLN
Status: DC | PRN
  Administered 2012-01-04: 4000 mL

## 2012-01-04 MED ORDER — ONDANSETRON HCL 4 MG PO TABS: 4.0000 mg | ORAL_TABLET | Freq: Four times a day (QID) | ORAL | Status: DC | PRN

## 2012-01-04 MED ORDER — PIOGLITAZONE HCL 30 MG PO TABS
30.0000 mg | ORAL_TABLET | Freq: Every day | ORAL | Status: DC
  Administered 2012-01-05 – 2012-01-07 (×3): 30 mg via ORAL
  Filled 2012-01-04 (×4): qty 1

## 2012-01-04 MED ORDER — TAMOXIFEN CITRATE 20 MG PO TABS: 20.0000 mg | ORAL_TABLET | Freq: Every day | ORAL | Status: DC

## 2012-01-04 MED ORDER — METOCLOPRAMIDE HCL 10 MG PO TABS: 5.0000 mg | ORAL_TABLET | Freq: Three times a day (TID) | ORAL | Status: DC | PRN

## 2012-01-04 MED ORDER — WARFARIN VIDEO: Freq: Once | Status: DC

## 2012-01-04 MED ORDER — LACTATED RINGERS IV SOLN
INTRAVENOUS | Status: DC | PRN
  Administered 2012-01-04 (×2): via INTRAVENOUS

## 2012-01-04 SURGICAL SUPPLY — 60 items
BANDAGE COBAN STERILE 6 (GAUZE/BANDAGES/DRESSINGS) ×2 IMPLANT
BLADE KNIFE  20 PERSONNA (BLADE) ×2
BLADE KNIFE 20 PERSONNA (BLADE) ×2 IMPLANT
BLADE SAW SAG 90X13X1.27 (BLADE) IMPLANT
BLADE SAW SGTL 13.0X1.19X90.0M (BLADE) ×2 IMPLANT
BNDG COHESIVE 6X5 TAN STRL LF (GAUZE/BANDAGES/DRESSINGS) IMPLANT
BONE CEMENT PALACOSE (Orthopedic Implant) ×4 IMPLANT
BOWL SMART MIX CTS (DISPOSABLE) ×2 IMPLANT
CEMENT BONE PALACOSE (Orthopedic Implant) ×2 IMPLANT
CLOTH BEACON ORANGE TIMEOUT ST (SAFETY) ×2 IMPLANT
COVER BACK TABLE 24X17X13 BIG (DRAPES) ×2 IMPLANT
COVER SURGICAL LIGHT HANDLE (MISCELLANEOUS) ×4 IMPLANT
CUFF TOURNIQUET SINGLE 34IN LL (TOURNIQUET CUFF) IMPLANT
CUFF TOURNIQUET SINGLE 44IN (TOURNIQUET CUFF) IMPLANT
DRAPE EXTREMITY T 121X128X90 (DRAPE) ×2 IMPLANT
DRAPE PROXIMA HALF (DRAPES) ×2 IMPLANT
DRAPE U-SHAPE 47X51 STRL (DRAPES) ×2 IMPLANT
DRSG ADAPTIC 3X8 NADH LF (GAUZE/BANDAGES/DRESSINGS) IMPLANT
DRSG PAD ABDOMINAL 8X10 ST (GAUZE/BANDAGES/DRESSINGS) IMPLANT
DURAPREP 26ML APPLICATOR (WOUND CARE) ×2 IMPLANT
ELECT REM PT RETURN 9FT ADLT (ELECTROSURGICAL) ×2
ELECTRODE REM PT RTRN 9FT ADLT (ELECTROSURGICAL) ×1 IMPLANT
FACESHIELD LNG OPTICON STERILE (SAFETY) ×6 IMPLANT
GAUZE XEROFORM 1X8 LF (GAUZE/BANDAGES/DRESSINGS) ×2 IMPLANT
GLOVE BIO SURGEON STRL SZ8.5 (GLOVE) ×2 IMPLANT
GLOVE BIOGEL PI IND STRL 7.5 (GLOVE) ×1 IMPLANT
GLOVE BIOGEL PI IND STRL 9 (GLOVE) ×1 IMPLANT
GLOVE BIOGEL PI INDICATOR 7.5 (GLOVE) ×1
GLOVE BIOGEL PI INDICATOR 9 (GLOVE) ×1
GLOVE SURG ORTHO 9.0 STRL STRW (GLOVE) ×4 IMPLANT
GLOVE SURG SS PI 7.5 STRL IVOR (GLOVE) ×4 IMPLANT
GLOVE SURG SS PI 8.5 STRL IVOR (GLOVE) ×1
GLOVE SURG SS PI 8.5 STRL STRW (GLOVE) ×1 IMPLANT
GOWN PREVENTION PLUS XLARGE (GOWN DISPOSABLE) ×2 IMPLANT
GOWN SRG XL XLNG 56XLVL 4 (GOWN DISPOSABLE) ×3 IMPLANT
GOWN STRL NON-REIN LRG LVL3 (GOWN DISPOSABLE) ×2 IMPLANT
GOWN STRL NON-REIN XL XLG LVL4 (GOWN DISPOSABLE) ×3
HANDPIECE INTERPULSE COAX TIP (DISPOSABLE) ×1
KIT BASIN OR (CUSTOM PROCEDURE TRAY) ×2 IMPLANT
KIT ROOM TURNOVER OR (KITS) ×2 IMPLANT
MANIFOLD NEPTUNE II (INSTRUMENTS) ×2 IMPLANT
NEEDLE SPNL 18GX3.5 QUINCKE PK (NEEDLE) IMPLANT
NS IRRIG 1000ML POUR BTL (IV SOLUTION) ×2 IMPLANT
PACK TOTAL JOINT (CUSTOM PROCEDURE TRAY) ×2 IMPLANT
PAD ARMBOARD 7.5X6 YLW CONV (MISCELLANEOUS) ×4 IMPLANT
PADDING CAST COTTON 6X4 STRL (CAST SUPPLIES) IMPLANT
SET HNDPC FAN SPRY TIP SCT (DISPOSABLE) ×1 IMPLANT
SPONGE GAUZE 4X4 12PLY (GAUZE/BANDAGES/DRESSINGS) IMPLANT
STAPLER VISISTAT 35W (STAPLE) ×2 IMPLANT
SUCTION FRAZIER TIP 10 FR DISP (SUCTIONS) IMPLANT
SUT VIC AB 0 CTB1 27 (SUTURE) ×2 IMPLANT
SUT VIC AB 1 CTX 36 (SUTURE) ×1
SUT VIC AB 1 CTX36XBRD ANBCTR (SUTURE) ×1 IMPLANT
SUT VIC AB 2-0 CTB1 (SUTURE) ×2 IMPLANT
SYR 50ML SLIP (SYRINGE) ×2 IMPLANT
TOWEL OR 17X24 6PK STRL BLUE (TOWEL DISPOSABLE) ×2 IMPLANT
TOWEL OR 17X26 10 PK STRL BLUE (TOWEL DISPOSABLE) ×2 IMPLANT
TRAY FOLEY CATH 14FR (SET/KITS/TRAYS/PACK) ×2 IMPLANT
WATER STERILE IRR 1000ML POUR (IV SOLUTION) ×4 IMPLANT
WRAP KNEE MAXI GEL POST OP (GAUZE/BANDAGES/DRESSINGS) ×2 IMPLANT

## 2012-01-04 NOTE — Anesthesia Procedure Notes (Addendum)
Anesthesia Regional Block:  Femoral nerve block  Pre-Anesthetic Checklist: ,, timeout performed, Correct Patient, Correct Site, Correct Laterality, Correct Procedure,, site marked, risks and benefits discussed, Surgical consent,  Pre-op evaluation,  At surgeon's request and post-op pain management  Laterality: Right  Prep: chloraprep       Needles:  Injection technique: Single-shot  Needle Type: Echogenic Stimulator Needle     Needle Length: 9cm  Needle Gauge: 21    Additional Needles:  Procedures: ultrasound guided and nerve stimulator Femoral nerve block  Nerve Stimulator or Paresthesia:  Response: Quadriceps muscle contraction, 0.45 mA,   Additional Responses:   Narrative:  Start time: 01/04/2012 9:10 AM End time: 01/04/2012 9:25 AM Injection made incrementally with aspirations every 5 mL.  Performed by: Personally  Anesthesiologist: Dr Chaney Malling  Additional Notes: Functioning IV was confirmed and monitors were applied.  A 90mm 21ga Arrow echogenic stimulator needle was used. Sterile prep and drape,hand hygiene and sterile gloves were used.  Negative aspiration and negative test dose prior to incremental administration of local anesthetic. The patient tolerated the procedure well.    Femoral nerve block Procedure Name: Intubation Date/Time: 01/04/2012 10:35 AM Performed by: Rossie Muskrat Pre-anesthesia Checklist: Patient identified, Timeout performed, Emergency Drugs available and Suction available Patient Re-evaluated:Patient Re-evaluated prior to inductionOxygen Delivery Method: Circle system utilized Preoxygenation: Pre-oxygenation with 100% oxygen Intubation Type: IV induction Ventilation: Mask ventilation without difficulty Laryngoscope Size: Miller and 2 Grade View: Grade III Tube type: Oral Tube size: 7.5 mm Number of attempts: 2 Airway Equipment and Method: Stylet and Bougie stylet Placement Confirmation: breath sounds checked- equal and bilateral and  positive ETCO2 (unable to see cords. Grade 3 view. Bougie used on 2nd attempt w/out difficlty) Secured at: 22 cm Tube secured with: Tape Dental Injury: Teeth and Oropharynx as per pre-operative assessment and Injury to lip

## 2012-01-04 NOTE — Progress Notes (Signed)
ANTICOAGULATION CONSULT NOTE - Initial Consult  Pharmacy Consult for Coumadin Indication: VTE PPX, s/p TKA  Allergies  Allergen Reactions  . Darvon Other (See Comments)    Gets her "high"    Patient Measurements:   Weight = 124 kg  Vital Signs: Temp: 98.6 F (37 C) (03/06 1330) Temp src: Oral (03/06 0843) BP: 129/62 mmHg (03/06 1330) Pulse Rate: 69  (03/06 1330)  Labs: No results found for this basename: HGB:2,HCT:3,PLT:3,APTT:3,LABPROT:3,INR:3,HEPARINUNFRC:3,CREATININE:3,CKTOTAL:3,CKMB:3,TROPONINI:3 in the last 72 hours CrCl is unknown because there is no height on file for the current visit.  Medical History: Past Medical History  Diagnosis Date  . PONV (postoperative nausea and vomiting)   . Hypertension   . Hyperlipidemia   . Shortness of breath     "mild upon exertion"  . Diabetes mellitus     oral  . Hypothyroidism   . Anemia   . Urinary tract infection     hx of  . H/O hiatal hernia   . Irritable bowel   . Cancer     hx of skin ca  . Arthritis     Medications:  Scheduled:    .  ceFAZolin (ANCEF) IV  2 g Intravenous 60 min Pre-Op  .  ceFAZolin (ANCEF) IV  2 g Intravenous Q6H  . diazepam  2 mg Oral Daily  . ferrous sulfate  325 mg Oral TID PC  . insulin aspart  0-15 Units Subcutaneous TID WC  . irbesartan  300 mg Oral QHS  . levothyroxine  100 mcg Oral Daily  . linagliptin  5 mg Oral Daily  . pioglitazone  30 mg Oral Daily  . tamoxifen  20 mg Oral Daily  . triamterene-hydrochlorothiazide  1 each Oral Daily    Assessment: 71 year old s/p TKA, beginning Coumadin for DVT prophylaxis  Goal of Therapy:  INR 2-3   Plan:  1) Coumadin 10 mg po x 1 dose tonight 2) Daily PT / INR  Thank you.  Elwin Sleight 01/04/2012,2:19 PM

## 2012-01-04 NOTE — Progress Notes (Signed)
Pt admitted from PACU s/p R TKR. Knee precations. +cms. Ice brace to R knee. Coban dressing dry and intact to R knee. Pt is lethargic but arousable and alert and oriented x 3. Pt is HOH. Neuro check is otherwise negative. Lungs CTA but diminished in the bases. Pt has a nonprod cough. Pt sats 99% 2lpm. Pt has TEDS on and will be started on Coumadin for DVT. Pt has a foley draining mod amount of clear yellow urine. Pt repts LBM 3/5. Abdomen is soft flat nontender and nondistended. BS hypoactive x 4. Pt denies passing gas or nausea or vomiting.

## 2012-01-04 NOTE — Op Note (Signed)
OPERATIVE REPORT  DATE OF SURGERY: 01/04/2012  PATIENT:  Autumn Chambers,  71 y.o. female  PRE-OPERATIVE DIAGNOSIS:  Osteoarthritis Right Knee  POST-OPERATIVE DIAGNOSIS:  Osteoarthritis Right Knee  PROCEDURE:  Procedure(s): TOTAL KNEE ARTHROPLASTY Zimmer components. Size D. femur. Size 4 tibia. 12 mm polyethylene tray. 29 mm patella. Stem for the tibial component.  SURGEON:  Surgeon(s): Nadara Mustard, MD  ANESTHESIA:   regional and general  EBL:  Minimal ML  SPECIMEN:  No Specimen  TOURNIQUET:  * No tourniquets in log *  PROCEDURE DETAILS: Patient is a 71 year old woman with osteoarthritis of her right knee she has lost range of motion lacks 10 of full extension has pain with activities of daily living she has failed conservative treatment including activity modification assistive devices injections anti-inflammatories all without relief and presents at this time for total knee arthroplasty. Risks and benefits were discussed including infection neurovascular injury persistent pain DVT pulmonary embolus need for additional surgery. Patient states she understands and wished to proceed at this time. Should procedure patient was brought to or room tendon underwent a general anesthetic after femoral block. After adequate levels of anesthesia were obtained patient's right lower extremity was prepped using DuraPrep draped into a sterile field an [place] was used to cover all exposed skin. A midline incision was made carried down to the medial parapatellar retinacular incision. The patella was everted 10 mm was cut from the patella and this was sized for a 29 and the lug cuts were made for the size 29. Attention was focused on the femur 10 mm was taken off the distal femur with 5 of valgus. Attention was then focused on the tibia and 10 mm was taken off the tibia with neutral varus valgus and a 7 posterior slope. The femur was then box cut for a size D the chamfer cuts were made and the trial  implant was placed. The tibia was trialed and a size 4 tibial tray was placed. Patient had very soft bone it was elected to place a keeled to minimize the risk of subsidence of the tibial tray. This was then trialed with a 12 mm poly-tray and the patient's knee was stable. The trial components were removed the wound was irrigated with pulsatile lavage. Meniscus and loose debris was removed. The stem with the keel was placed in cemented in place for the tibial tray the femoral component was cemented in place and the 12 mm poly-tray was inserted after all loose cement was removed and the knee was kept in cyst extension until the cement hardened. The patella was cemented and left clamped until the cement hardened. The knee was then placed through range of motion the patella tracked midline. The wound is further irrigated with lavage the retinacular incision was closed using #1 Vicryl subcutaneous was closed using 0 Vicryl the skin was closed with approximate staples. The wound was covered with Adaptic orthopedic sponges Kerlix and Coban. Patient was extubated taken to the PACU in stable condition.  PLAN OF CARE: Admit to inpatient   PATIENT DISPOSITION:  PACU - hemodynamically stable.   Nadara Mustard, MD 01/04/2012 12:23 PM

## 2012-01-04 NOTE — Progress Notes (Signed)
Spoke with Dr Chaney Malling....explained situation about no info regarding her lymph nodes removed on left axilla--pt states only "2".Marland KitchenMarland KitchenHe said present IV  ok

## 2012-01-04 NOTE — Transfer of Care (Signed)
Immediate Anesthesia Transfer of Care Note  Patient: Autumn Chambers  Procedure(s) Performed: Procedure(s) (LRB): TOTAL KNEE ARTHROPLASTY (Right)  Patient Location: PACU  Anesthesia Type: General  Level of Consciousness: awake and alert   Airway & Oxygen Therapy: Patient Spontanous Breathing and Patient connected to nasal cannula oxygen  Post-op Assessment: Report given to PACU RN and Post -op Vital signs reviewed and stable  Post vital signs: Reviewed and stable  Complications: No apparent anesthesia complications

## 2012-01-04 NOTE — Anesthesia Postprocedure Evaluation (Signed)
Anesthesia Post Note  Patient: Autumn Chambers  Procedure(s) Performed: Procedure(s) (LRB): TOTAL KNEE ARTHROPLASTY (Right)  Anesthesia type: General  Patient location: PACU  Post pain: Pain level controlled and Adequate analgesia  Post assessment: Post-op Vital signs reviewed, Patient's Cardiovascular Status Stable, Respiratory Function Stable, Patent Airway and Pain level controlled  Last Vitals:  Filed Vitals:   01/04/12 1215  BP:   Pulse:   Temp: 36.5 C  Resp:     Post vital signs: Reviewed and stable  Level of consciousness: awake, alert  and oriented  Complications: No apparent anesthesia complications

## 2012-01-04 NOTE — H&P (Signed)
Autumn Chambers is an 71 y.o. female.   Chief Complaint: Osteoarthritis pain right leg HPI: Patient is a 71 year old woman who complains of mechanical symptoms of her right knee. She has pain with activities of daily living she has failed conservative treatment including assisted devices activity modification anti-inflammatories injections all without relief.  Past Medical History  Diagnosis Date  . PONV (postoperative nausea and vomiting)   . Hypertension   . Hyperlipidemia   . Shortness of breath     "mild upon exertion"  . Diabetes mellitus     oral  . Hypothyroidism   . Anemia   . Urinary tract infection     hx of  . H/O hiatal hernia   . Irritable bowel   . Cancer     hx of skin ca  . Arthritis     Past Surgical History  Procedure Date  . Breast lumpectomy     left  . Skin cancer excision     approx 4 years ago  . Achilles tendon surgery   . Eye surgery     bilateral cataract surgery  . Tonsillectomy   . Dilation and curettage of uterus     No family history on file. Social History:  reports that she has quit smoking. Her smoking use included Cigarettes. She quit after 40 years of use. She does not have any smokeless tobacco history on file. She reports that she does not drink alcohol or use illicit drugs.  Allergies:  Allergies  Allergen Reactions  . Darvon Other (See Comments)    Reaction unknown    Medications Prior to Admission  Medication Dose Route Frequency Provider Last Rate Last Dose  . ceFAZolin (ANCEF) IVPB 2 g/50 mL premix  2 g Intravenous 60 min Pre-Op Nadara Mustard, MD       Medications Prior to Admission  Medication Sig Dispense Refill  . tamoxifen (NOLVADEX) 20 MG tablet Take 1 tablet (20 mg total) by mouth daily.  90 tablet  0    No results found for this or any previous visit (from the past 48 hour(s)). No results found.  Review of Systems  All other systems reviewed and are negative.    There were no vitals taken for this  visit. Physical Exam on examination patient has decreased range of motion of her right knee with range of motion from 10-100. She has tenderness to palpation of the mediolateral joint lines as well as the patellofemoral joint is crepitation with range of motion of her knee. The ligaments are stable. Radiographs showed tricompartmental osteoarthritic changes of the right knee.   Assessment/Plan Assessment: Osteoarthritis right knee. Plan plan will plan for total knee arthroplasty. Risks and benefits were discussed including infection neurovascular injury persistent pain DVT pulmonary embolus need for additional surgery. Patient states she understands and wishes to proceed at this time.  Autumn Chambers V 01/04/2012, 6:17 AM

## 2012-01-04 NOTE — Preoperative (Signed)
Beta Blockers   Reason not to administer Beta Blockers:Not Applicable 

## 2012-01-04 NOTE — Anesthesia Preprocedure Evaluation (Signed)
Anesthesia Evaluation  Patient identified by MRN, date of birth, ID band Patient awake    Reviewed: Allergy & Precautions, H&P , NPO status , Patient's Chart, lab work & pertinent test results  History of Anesthesia Complications (+) PONV  Airway Mallampati: II  Neck ROM: full    Dental   Pulmonary shortness of breath, former smoker         Cardiovascular hypertension,     Neuro/Psych    GI/Hepatic hiatal hernia,   Endo/Other  Diabetes mellitus-Hypothyroidism Morbid obesity  Renal/GU      Musculoskeletal   Abdominal   Peds  Hematology   Anesthesia Other Findings   Reproductive/Obstetrics                           Anesthesia Physical Anesthesia Plan  ASA: III  Anesthesia Plan: General and Regional   Post-op Pain Management: MAC Combined w/ Regional for Post-op pain   Induction: Intravenous  Airway Management Planned: Oral ETT  Additional Equipment:   Intra-op Plan:   Post-operative Plan: Extubation in OR  Informed Consent: I have reviewed the patients History and Physical, chart, labs and discussed the procedure including the risks, benefits and alternatives for the proposed anesthesia with the patient or authorized representative who has indicated his/her understanding and acceptance.     Plan Discussed with: CRNA and Surgeon  Anesthesia Plan Comments:         Anesthesia Quick Evaluation

## 2012-01-05 LAB — GLUCOSE, CAPILLARY
Glucose-Capillary: 103 mg/dL — ABNORMAL HIGH (ref 70–99)
Glucose-Capillary: 135 mg/dL — ABNORMAL HIGH (ref 70–99)
Glucose-Capillary: 119 mg/dL — ABNORMAL HIGH (ref 70–99)

## 2012-01-05 LAB — BASIC METABOLIC PANEL
Chloride: 108 mEq/L (ref 96–112)
Creatinine, Ser: 1.47 mg/dL — ABNORMAL HIGH (ref 0.50–1.10)
GFR calc Af Amer: 41 mL/min — ABNORMAL LOW (ref >90)
Potassium: 5 mEq/L (ref 3.5–5.1)
Sodium: 136 mEq/L (ref 135–145)

## 2012-01-05 LAB — CBC
HCT: 25 % — ABNORMAL LOW (ref 36.0–46.0)
RBC: 2.69 MIL/uL — ABNORMAL LOW (ref 3.87–5.11)
RDW: 15.6 % — ABNORMAL HIGH (ref 11.5–15.5)
WBC: 6.2 10*3/uL (ref 4.0–10.5)

## 2012-01-05 LAB — PROTIME-INR
INR: 1.18 (ref 0.00–1.49)
Prothrombin Time: 15.3 seconds — ABNORMAL HIGH (ref 11.6–15.2)

## 2012-01-05 MED ORDER — CHLORHEXIDINE GLUCONATE CLOTH 2 % EX PADS
6.0000 | MEDICATED_PAD | Freq: Every day | CUTANEOUS | Status: DC
  Administered 2012-01-06: 6 via TOPICAL

## 2012-01-05 MED ORDER — MUPIROCIN 2 % EX OINT
1.0000 "application " | TOPICAL_OINTMENT | Freq: Two times a day (BID) | CUTANEOUS | Status: DC
  Administered 2012-01-05 – 2012-01-07 (×5): 1 via NASAL
  Filled 2012-01-05: qty 22

## 2012-01-05 MED ORDER — WARFARIN SODIUM 5 MG PO TABS
5.0000 mg | ORAL_TABLET | Freq: Once | ORAL | Status: AC
  Administered 2012-01-05: 5 mg via ORAL
  Filled 2012-01-05: qty 1

## 2012-01-05 NOTE — Progress Notes (Signed)
PT TREATMENT NOTE  01/05/12 1400  PT Visit Information  Last PT Received On 01/05/12  Precautions  Precautions Knee  Required Braces or Orthoses No  Restrictions  RLE Weight Bearing WBAT  Bed Mobility  Bed Mobility Yes  Supine to Sit (pt. )  Sit to Supine 1: +2 Total assist;Patient percentage (comment);HOB flat (pt. 25%)  Transfers  Transfers Yes  Sit to Stand 1: +2 Total assist;Patient percentage (comment);With upper extremity assist;With armrests;From chair/3-in-1 (pt. 60%)  Sit to Stand Details (indicate cue type and reason) vc's for safety and technique  Stand to Sit 1: +2 Total assist;Patient percentage (comment);To bed;With upper extremity assist (pt. 60%)  Stand to Sit Details safety cues; pt. tends to sit in unsafe manner; impulsive  Ambulation/Gait  Ambulation/Gait Yes  Ambulation/Gait Assistance 1: +2 Total assist (pt. 70%)  Ambulation/Gait Assistance Details (indicate cue type and reason) directional and safety cues  Ambulation Distance (Feet) 4 Feet  Assistive device Rolling walker  Gait Pattern Step-to pattern;Trunk flexed  Exercises  Exercises Total Joint  Total Joint Exercises  Ankle Circles/Pumps AROM;Both;10 reps;Supine  Quad Sets AROM;Right;10 reps;Supine  Short Arc Quad AAROM;Right;5 reps;Supine  PT - End of Session  Equipment Utilized During Treatment Gait belt  Activity Tolerance Patient tolerated treatment well;Patient limited by fatigue;Patient limited by pain  Patient left in bed;with call bell in reach  Nurse Communication Mobility status for transfers;Mobility status for ambulation;Weight bearing status  General  Behavior During Session [facility] for tasks performed  [facility] for tasks performed  PT - Assessment/Plan  Comments on Treatment Session Pt. is unsafe to approach surface to sit.  Even with cues, still tends to sit impulsively.  Needs more work for improved function and safety.  PT Plan Discharge plan remains appropriate  PT Frequency  7X/week  Follow Up Recommendations Skilled nursing facility  Equipment Recommended Defer to next venue  Acute Rehab PT Goals  PT Goal: Sit to Supine/Side - Progress Progressing toward goal  PT Goal: Sit to Stand - Progress Progressing toward goal  PT Goal: Stand to Sit - Progress Progressing toward goal  PT Goal: Ambulate - Progress Progressing toward goal  PT Goal: Perform Home Exercise Program - Progress Progressing toward goal   Weldon Picking PT Acute Rehab Services [phone] Beeper [phone]

## 2012-01-05 NOTE — Progress Notes (Addendum)
Pt is POD 1 s/p R TKR. Knee precautions. +cms. Pt is to be WBAT with RW. Pt is alert and oriented x 3 but very HOH. Pt has a foley draining mod amount of clear yellow urine. Pt repts LBM 3/5. BS+x4. Pt repts passing gas and denies nausea or vomiting. Pt tolerates diet fair to good. Abdomen soft flat nontender and nondistended. Lungs CTA but diminished in the bases. Pt repts that she is performing IS per order. R knee has a dry coban dressing and ice prn. Pt has teds on per order.

## 2012-01-05 NOTE — Progress Notes (Signed)
OT NOTE: OT consult received and appreciated, noted pt. Anticipates D/C to SNF and will sign off and defer further OT to SNF. Thanks!  Cassandria Anger, OTR/L Pager: [phone] 01/05/2012 .

## 2012-01-05 NOTE — Progress Notes (Signed)
CSW received referral for pt needing SNF placement. CSW completed psychosocial assessment (located in shadow chart). Pt made arrangements for [facility].   CSW spoke with facility and confirmed pt ok for admission Saturday, facility will need d/c summary by early Friday.   CSW will continue to follow to facilitate d/c Saturday.  Baxter Flattery, MSW [phone]

## 2012-01-05 NOTE — Progress Notes (Signed)
UR COMPLETED  

## 2012-01-05 NOTE — Progress Notes (Signed)
Patient ID: Autumn Chambers, female   DOB: 02/17/1941, 71 y.o.   MRN: [id number] Post operative day 1 right total knee arthroplasty. Patient is comfortable. He'll start physical therapy progressive ambulation extension exercises weightbearing as tolerated. Patient is scheduled for discharge to camp in place. Anticipate patient should be able to be discharged on Friday or Saturday.

## 2012-01-05 NOTE — Progress Notes (Signed)
PHYSICAL THERAPY EVALUATION NOTE  01/05/12 0855  PT Visit Information  Last PT Received On 01/05/12  Patient Stated Goals  Goal #1 do ADL without pain  Precautions  Precautions Knee  Precaution Booklet Issued Yes (comment)  Precaution Comments reviewed no pillow under knee  Required Braces or Orthoses No  Restrictions  Weight Bearing Restrictions Yes  RLE Weight Bearing WBAT  Home Living  Lives With Alone  Home Adaptive Equipment Walker - rolling;Straight cane  Prior Function  Level of Independence Independent with basic ADLs;Requires assistive device for independence;Independent with gait;Independent with transfers  Able to Take Stairs? Yes  Driving Yes  Cognition  Arousal/Alertness Awake/alert  Overall Cognitive Status Appears within functional limits for tasks assessed  Orientation Level Oriented X4  Sensation  Light Touch Appears Intact (lower legs)  Bed Mobility  Bed Mobility Yes  Supine to Sit 1: +2 Total assist;Patient percentage (comment) (pt. 50%)  Supine to Sit Details (indicate cue type and reason) cues for safe technique   Sit to Supine Not Tested (comment)  Transfers  Transfers Yes  Sit to Stand 1: +2 Total assist;Patient percentage (comment);From elevated surface;From bed;With upper extremity assist (pt. 60%)  Sit to Stand Details (indicate cue type and reason) cues for technique and sequence  Stand to Sit 1: +2 Total assist;Patient percentage (comment);With armrests;To chair/3-in-1 (pt. 60%)  Stand to Sit Details cues for rith LE position and hand placement  RUE Assessment  RUE Assessment Not tested (deferred to OT)  LUE Assessment  LUE Assessment Not tested (deferred to OT)  RLE Assessment  RLE Assessment (good ankle pump; fair quad set)  LLE Assessment  LLE Assessment [facility]  Exercises  Exercises Total Joint  Total Joint Exercises  Ankle Circles/Pumps AROM;Both;10 reps;Supine  Quad Sets AROM;Right;10 reps;Supine  Knee Flexion  AAROM;Seated;Right;Other (comment) (AAROM -15 to 70 degrees)  PT - End of Session  Equipment Utilized During Treatment Gait belt  Activity Tolerance Patient tolerated treatment well;Patient limited by fatigue;Patient limited by pain  Patient left in chair;with call bell in reach  Nurse Communication Mobility status for transfers;Mobility status for ambulation;Weight bearing status  General  Behavior During Session [facility] for tasks performed  Cognition [facility] for tasks performed  PT Assessment  Clinical Impression Statement Pt. is s/ right TKR with associated decreased functional mobility, gait, transfers, ROM and strength.  Will need acute followed by ST SNF PT to maximize her function and for eventual return home to independent living.  PT Recommendation/Assessment Patient will need skilled PT in the acute care venue  PT Problem List Decreased strength;Decreased range of motion;Decreased activity tolerance;Decreased mobility;Decreased knowledge of use of DME;Decreased knowledge of precautions;Obesity;Pain  Barriers to Discharge Decreased caregiver support  PT Therapy Diagnosis  Difficulty walking;Acute pain  PT Plan  PT Frequency 7X/week  PT Treatment/Interventions DME instruction;Gait training;Functional mobility training;Therapeutic activities;Therapeutic exercise;Patient/family education  PT Recommendation  Follow Up Recommendations Skilled nursing facility  Equipment Recommended Defer to next venue  Individuals Consulted  Consulted and Agree with Results and Recommendations Patient  Acute Rehab PT Goals  PT Goal Formulation With patient  Time For Goal Achievement 7 days  Pt will go Supine/Side to Sit with min assist  PT Goal: Supine/Side to Sit - Progress Goal set today  Pt will go Sit to Supine/Side with min assist  PT Goal: Sit to Supine/Side - Progress Goal set today  Pt will go Sit to Stand with min assist  PT Goal: Sit to Stand - Progress Goal set today  Pt  will go Stand to Sit  with min assist  PT Goal: Stand to Sit - Progress Goal set today  Pt will Ambulate 51 - 150 feet;with min assist;with rolling walker  PT Goal: Ambulate - Progress Goal set today  Pt will Perform Home Exercise Program with supervision, verbal cues required/provided  PT Goal: Perform Home Exercise Program - Progress Goal set today  Weldon Picking PT Acute Rehab Services [phone] Beeper [phone]

## 2012-01-05 NOTE — Progress Notes (Signed)
ANTICOAGULATION CONSULT NOTE - Follow Up Consult  Pharmacy Consult for coumadin Indication: VTE pxl s/p TKA  Allergies  Allergen Reactions  . Darvon Other (See Comments)    Gets her "high"    Patient Measurements:   Heparin Dosing Weight:   Vital Signs: Temp: 98.7 F (37.1 C) (03/07 0559) BP: 120/66 mmHg (03/07 0559) Pulse Rate: 56  (03/07 0559)  Labs:  Basename 01/05/12 0530  HGB 8.0*  HCT 25.0*  PLT 200  APTT --  LABPROT 15.3*  INR 1.18  HEPARINUNFRC --  CREATININE 1.47*  CKTOTAL --  CKMB --  TROPONINI --   CrCl is unknown because there is no height on file for the current visit.   Medications:  Scheduled:    .  ceFAZolin (ANCEF) IV  2 g Intravenous 60 min Pre-Op  .  ceFAZolin (ANCEF) IV  2 g Intravenous Q6H  . Chlorhexidine Gluconate Cloth  6 each Topical Daily  . coumadin book   Does not apply Once  . diazepam  2 mg Oral Daily  . ferrous sulfate  325 mg Oral TID PC  . insulin aspart  0-15 Units Subcutaneous TID WC  . irbesartan  300 mg Oral QHS  . levothyroxine  100 mcg Oral Q breakfast  . linagliptin  5 mg Oral Q breakfast  . mupirocin ointment  1 application Nasal BID  . pioglitazone  30 mg Oral Q breakfast  . tamoxifen  20 mg Oral Daily  . triamterene-hydrochlorothiazide  1 each Oral Daily  . warfarin  10 mg Oral ONCE-1800  . warfarin   Does not apply Once  . Warfarin - Pharmacist Dosing Inpatient   Does not apply q1800  . DISCONTD: tamoxifen  20 mg Oral Daily    Assessment: 71 yo F on warfarin for VTE pxl s/p R TKA. Warfarin points: 3  Goal of Therapy:  INR 2-3   Plan:  Warfarin 5 mg po x1 Daily PT/INR  Kenlea Woodell L. Illene Bolus, PharmD, BCPS Clinical Pharmacist Pager: [phone] 01/05/2012 8:13 AM

## 2012-01-06 ENCOUNTER — Encounter (HOSPITAL_COMMUNITY): Payer: Self-pay | Admitting: Orthopedic Surgery

## 2012-01-06 LAB — PROTIME-INR
INR: 1.45 (ref 0.00–1.49)
Prothrombin Time: 17.9 seconds — ABNORMAL HIGH (ref 11.6–15.2)

## 2012-01-06 LAB — BASIC METABOLIC PANEL
BUN: 24 mg/dL — ABNORMAL HIGH (ref 6–23)
Chloride: 106 mEq/L (ref 96–112)
GFR calc Af Amer: 49 mL/min — ABNORMAL LOW (ref >90)
Potassium: 4.6 mEq/L (ref 3.5–5.1)

## 2012-01-06 LAB — CBC
HCT: 23.6 % — ABNORMAL LOW (ref 36.0–46.0)
Hemoglobin: 7.8 g/dL — ABNORMAL LOW (ref 12.0–15.0)
WBC: 6.3 10*3/uL (ref 4.0–10.5)

## 2012-01-06 LAB — GLUCOSE, CAPILLARY: Glucose-Capillary: 130 mg/dL — ABNORMAL HIGH (ref 70–99)

## 2012-01-06 MED ORDER — WARFARIN SODIUM 1 MG PO TABS: 1.0000 mg | ORAL_TABLET | Freq: Every day | ORAL | Status: DC

## 2012-01-06 MED ORDER — OXYCODONE-ACETAMINOPHEN 5-325 MG PO TABS: 1.0000 | ORAL_TABLET | ORAL | Status: AC | PRN

## 2012-01-06 MED ORDER — MAGNESIUM HYDROXIDE 400 MG/5ML PO SUSP
30.0000 mL | Freq: Every day | ORAL | Status: DC | PRN
  Administered 2012-01-06: 30 mL via ORAL
  Filled 2012-01-06: qty 30

## 2012-01-06 MED ORDER — HYDROCODONE-ACETAMINOPHEN 5-500 MG PO TABS: 1.0000 | ORAL_TABLET | Freq: Four times a day (QID) | ORAL | Status: AC | PRN

## 2012-01-06 MED ORDER — WARFARIN SODIUM 5 MG PO TABS
5.0000 mg | ORAL_TABLET | Freq: Once | ORAL | Status: AC
  Administered 2012-01-06: 5 mg via ORAL
  Filled 2012-01-06: qty 1

## 2012-01-06 NOTE — Progress Notes (Signed)
Patient ID: Autumn Chambers, female   DOB: 30-Aug-1941, 71 y.o.   MRN: [id number] Plan for discharge to skilled nursing facility Saturday. Prescriptions on the chart at the F L2 signed and discharge summary completed.

## 2012-01-06 NOTE — Progress Notes (Signed)
PT TREATMENT NOTE  01/06/12 0859  PT Visit Information  Last PT Received On 01/06/12  Precautions  Precautions Knee  Precaution Comments reviewed no pillow under knee  Required Braces or Orthoses No  Restrictions  Weight Bearing Restrictions Yes  RLE Weight Bearing WBAT  Bed Mobility  Bed Mobility No  Transfers  Transfers Yes  Sit to Stand 1: +2 Total assist;Patient percentage (comment);From chair/3-in-1;With upper extremity assist;With armrests  Sit to Stand Details (indicate cue type and reason) cues for sequence and safety  Stand to Sit 3: Mod assist;With upper extremity assist;With armrests;To chair/3-in-1  Stand to Sit Details cues for safe descent  Ambulation/Gait  Ambulation/Gait Yes  Ambulation/Gait Assistance 1: +2 Total assist;Patient percentage (comment) (pt. 70%)  Ambulation/Gait Assistance Details (indicate cue type and reason) persistent cues for correct gait sequence and technique  Ambulation Distance (Feet) 40 Feet  Assistive device Rolling walker  Gait Pattern Step-to pattern;Trunk flexed  Exercises  Exercises Total Joint  Total Joint Exercises  Ankle Circles/Pumps AROM;Both;10 reps;Supine  Quad Sets AROM;Right;10 reps;Supine  Knee Flexion AAROM;Seated;Right;Other (comment) (-5 to 85 degrees)  Short Arc Quad AAROM;10 reps;Right;Supine (progressing toward active ROM)  PT - End of Session  Equipment Utilized During Treatment Gait belt  Activity Tolerance Patient tolerated treatment well;Patient limited by fatigue;Patient limited by pain  Patient left in chair;with call bell in reach  Nurse Communication Mobility status for transfers;Mobility status for ambulation;Weight bearing status  General  Behavior During Session [facility] for tasks performed  Cognition [facility] for tasks performed  PT - Assessment/Plan  Comments on Treatment Session Pt. beginning to progress her gait in a more safe way, though still needs lots of cues.  ROM progressing nicely.  PT Plan  Discharge plan remains appropriate  PT Frequency 7X/week  Follow Up Recommendations Skilled nursing facility  Equipment Recommended Defer to next venue  Acute Rehab PT Goals  PT Goal: Sit to Stand - Progress Progressing toward goal  PT Goal: Stand to Sit - Progress Progressing toward goal  PT Goal: Ambulate - Progress Progressing toward goal  PT Goal: Perform Home Exercise Program - Progress Progressing toward goal   Weldon Picking PT Acute Rehab Services [phone] Beeper [phone]

## 2012-01-06 NOTE — Discharge Instructions (Signed)
Physical therapy progressive ambulation weightbearing as tolerated. Patient to work on knee extension. Patient may shower and get incision wet.

## 2012-01-06 NOTE — Progress Notes (Signed)
Pt is s/p R TKR. Knee precautions. +cms. Pt RLE 1+/2+ pitting edema. Pt has a dry coban dressing to R knee. Pt ambulates with one min to mod assist and RW. Pt lungs CTA and heart rate regular rate and rhythm. No s/sx resp or cardiac distress and no c/o such. Vital signs stable. Pt being encouraged to perform IS per order. Pt is HOH. Pt is alert and oriented x 3. Abdomen is soft flat nontender and nondistended. Pt is passing gas. Pt denies nausea or vomiting and tolerates diet fair. Pt ambulates to bathroom to void quantity sufficient. Pt hgb 7.8. Dr. Lajoyce Corners made aware. No new orders at this time. Plan is to d/c pt to SNF tomorrow.

## 2012-01-06 NOTE — Discharge Summary (Signed)
Physician Discharge Summary  Patient ID: Autumn Chambers MRN: [id number] DOB/AGE: 1941/10/19 71 y.o.  Admit date: 01/04/2012 Discharge date: 01/06/2012  Admission Diagnoses: Osteoarthritis right knee  Discharge Diagnoses: Same Active Problems:  * No active hospital problems. *    Discharged Condition: stable  Hospital Course: Patient's hospital course was essentially unremarkable she underwent total knee arthroplasty on 01/04/2012. She progressed slowly with physical therapy and was felt that she would benefit from short-term skilled nursing facility placement and patient was discharged to short-term skilled nursing in stable condition.  Consults: None  Significant Diagnostic Studies: labs: Routine labs  Treatments: surgery: Please see the operative note for total knee arthroplasty.  Discharge Exam: Blood pressure 105/46, pulse 93, temperature 98.5 F (36.9 C), temperature source Oral, resp. rate 20, SpO2 95.00%. Incision/Wound: incision clean and dry at time of discharge.  Disposition: Final discharge disposition not confirmed  Discharge Orders    Future Appointments: Provider: Department: Dept Phone: Center:   02/13/2012 10:45 AM Amada Kingfisher, PA [facility] [phone] None     Future Orders Please Complete By Expires   Diet - low sodium heart healthy      Call MD / Call 911      Comments:   If you experience chest pain or shortness of breath, CALL 911 and be transported to the hospital emergency room.  If you develope a fever above 101 F, pus (white drainage) or increased drainage or redness at the wound, or calf pain, call your surgeon's office.   Constipation Prevention      Comments:   Drink plenty of fluids.  Prune juice may be helpful.  You may use a stool softener, such as Colace (over the counter) 100 mg twice a day.  Use MiraLax (over the counter) for constipation as needed.   Increase activity slowly as tolerated      Weight Bearing as taught in  Physical Therapy      Comments:   Use a walker or crutches as instructed.     Medication List  As of 01/06/2012  6:27 AM   TAKE these medications         alendronate 70 MG tablet   Commonly known as: FOSAMAX   Take 70 mg by mouth every 7 (seven) days. Take with a full glass of water on an empty stomach.      CALCIUM PO   Take 1,500 mg by mouth 2 (two) times daily.      celecoxib 200 MG capsule   Commonly known as: CELEBREX   Take 200 mg by mouth daily.      cholecalciferol 1000 UNITS tablet   Commonly known as: VITAMIN D   Take 1,000 Units by mouth daily.      diazepam 2 MG tablet   Commonly known as: VALIUM   Take 2 mg by mouth daily.      diphenhydramine-acetaminophen 25-500 MG Tabs   Commonly known as: TYLENOL PM   Take 1 tablet by mouth at bedtime as needed. For sleep      Glucosamine 500 MG Caps   Take 1 capsule by mouth 3 (three) times daily.      HYDROcodone-acetaminophen 5-500 MG per tablet   Commonly known as: VICODIN   Take 1 tablet by mouth every 8 (eight) hours as needed. For pain      HYDROcodone-acetaminophen 5-500 MG per tablet   Commonly known as: VICODIN   Take 1 tablet by mouth every 6 (six) hours as needed  for pain.      irbesartan 300 MG tablet   Commonly known as: AVAPRO   Take 300 mg by mouth at bedtime.      levothyroxine 100 MCG tablet   Commonly known as: SYNTHROID, LEVOTHROID   Take 100 mcg by mouth daily.      oxyCODONE-acetaminophen 5-325 MG per tablet   Commonly known as: PERCOCET   Take 1 tablet by mouth every 4 (four) hours as needed for pain.      pioglitazone 30 MG tablet   Commonly known as: ACTOS   Take 30 mg by mouth daily.      sitaGLIPtin 100 MG tablet   Commonly known as: JANUVIA   Take 100 mg by mouth daily.      tamoxifen 20 MG tablet   Commonly known as: NOLVADEX   Take 20 mg by mouth daily.      triamterene-hydrochlorothiazide 37.5-25 MG per tablet   Commonly known as: MAXZIDE-25   Take 1 tablet by mouth  daily.      warfarin 1 MG tablet   Commonly known as: COUMADIN   Take 1 tablet (1 mg total) by mouth daily.           Follow-up Information    Follow up with [place] Zou V, MD in 2 weeks.   Contact information:   [address] [place] [address] [phone]          Signed: Nadara Mustard 01/06/2012, 6:27 AM

## 2012-01-06 NOTE — Progress Notes (Signed)
ANTICOAGULATION CONSULT NOTE - Follow Up Consult  Pharmacy Consult for coumadin Indication: VTE pxl s/p TKA  Allergies  Allergen Reactions  . Darvon Other (See Comments)    Gets her "high"    Patient Measurements:   Heparin Dosing Weight:   Vital Signs: Temp: 98.5 F (36.9 C) (03/08 0804) Temp src: Oral (03/08 0212) BP: 145/56 mmHg (03/08 0804) Pulse Rate: 97  (03/08 0804)  Labs:  Basename 01/06/12 0525 01/05/12 0530  HGB 7.8* 8.0*  HCT 23.6* 25.0*  PLT 169 200  APTT -- --  LABPROT 17.9* 15.3*  INR 1.45 1.18  HEPARINUNFRC -- --  CREATININE 1.26* 1.47*  CKTOTAL -- --  CKMB -- --  TROPONINI -- --   CrCl is unknown because there is no height on file for the current visit.   Medications:  Scheduled:     . Chlorhexidine Gluconate Cloth  6 each Topical Daily  . diazepam  2 mg Oral Daily  . ferrous sulfate  325 mg Oral TID PC  . insulin aspart  0-15 Units Subcutaneous TID WC  . irbesartan  300 mg Oral QHS  . levothyroxine  100 mcg Oral Q breakfast  . linagliptin  5 mg Oral Q breakfast  . mupirocin ointment  1 application Nasal BID  . pioglitazone  30 mg Oral Q breakfast  . tamoxifen  20 mg Oral Daily  . triamterene-hydrochlorothiazide  1 each Oral Daily  . warfarin  5 mg Oral ONCE-1800  . warfarin   Does not apply Once  . Warfarin - Pharmacist Dosing Inpatient   Does not apply q1800    Assessment: 71 yo F on warfarin for VTE pxl s/p R TKA 3/6. Warfarin points: 3. INR up to 1.45 today. Hgb down to 7.8   Cards: Max BP 145/56 today on Maxzide.  Endo: Hypothyroid on Levnothyroxine DM: CBGs 100-123 on SSI, Linagliptin, Actos,  Neuro: Diazepam  Renal: Scr down from 1.47 to 1.26  Heme/Onc: FESO4, Tamoxifen,  Goal of Therapy:  INR 2-3  Plan:  Warfarin 5 mg po x1 again today. Daily PT/INR Discharge summary dictated by Lajoyce Corners at 1mg /d.   Misty Stanley, PharmD, BCPS 01/06/12, [phone]

## 2012-01-07 DIAGNOSIS — E875 Hyperkalemia: Secondary | ICD-10-CM | POA: Diagnosis not present

## 2012-01-07 DIAGNOSIS — F411 Generalized anxiety disorder: Secondary | ICD-10-CM | POA: Diagnosis not present

## 2012-01-07 DIAGNOSIS — S8990XA Unspecified injury of unspecified lower leg, initial encounter: Secondary | ICD-10-CM | POA: Diagnosis not present

## 2012-01-07 DIAGNOSIS — D649 Anemia, unspecified: Secondary | ICD-10-CM | POA: Diagnosis not present

## 2012-01-07 DIAGNOSIS — Z5189 Encounter for other specified aftercare: Secondary | ICD-10-CM | POA: Diagnosis not present

## 2012-01-07 DIAGNOSIS — Z96659 Presence of unspecified artificial knee joint: Secondary | ICD-10-CM | POA: Diagnosis not present

## 2012-01-07 DIAGNOSIS — E119 Type 2 diabetes mellitus without complications: Secondary | ICD-10-CM | POA: Diagnosis not present

## 2012-01-07 DIAGNOSIS — M25569 Pain in unspecified knee: Secondary | ICD-10-CM | POA: Diagnosis not present

## 2012-01-07 DIAGNOSIS — Z7901 Long term (current) use of anticoagulants: Secondary | ICD-10-CM | POA: Diagnosis not present

## 2012-01-07 DIAGNOSIS — I10 Essential (primary) hypertension: Secondary | ICD-10-CM | POA: Diagnosis not present

## 2012-01-07 DIAGNOSIS — E785 Hyperlipidemia, unspecified: Secondary | ICD-10-CM | POA: Diagnosis not present

## 2012-01-07 DIAGNOSIS — D62 Acute posthemorrhagic anemia: Secondary | ICD-10-CM | POA: Diagnosis not present

## 2012-01-07 DIAGNOSIS — M1710 Unilateral primary osteoarthritis, unspecified knee: Secondary | ICD-10-CM | POA: Diagnosis not present

## 2012-01-07 DIAGNOSIS — M1990 Unspecified osteoarthritis, unspecified site: Secondary | ICD-10-CM | POA: Diagnosis not present

## 2012-01-07 DIAGNOSIS — E039 Hypothyroidism, unspecified: Secondary | ICD-10-CM | POA: Diagnosis not present

## 2012-01-07 LAB — BASIC METABOLIC PANEL
CO2: 21 mEq/L (ref 19–32)
Chloride: 106 mEq/L (ref 96–112)
GFR calc Af Amer: 39 mL/min — ABNORMAL LOW (ref >90)
Sodium: 135 mEq/L (ref 135–145)

## 2012-01-07 LAB — CBC
HCT: 22.9 % — ABNORMAL LOW (ref 36.0–46.0)
MCV: 90.9 fL (ref 78.0–100.0)
Platelets: 159 10*3/uL (ref 150–400)
RBC: 2.52 MIL/uL — ABNORMAL LOW (ref 3.87–5.11)
WBC: 6.3 10*3/uL (ref 4.0–10.5)

## 2012-01-07 LAB — PROTIME-INR
INR: 1.68 — ABNORMAL HIGH (ref 0.00–1.49)
Prothrombin Time: 20.1 seconds — ABNORMAL HIGH (ref 11.6–15.2)

## 2012-01-07 LAB — GLUCOSE, CAPILLARY: Glucose-Capillary: 101 mg/dL — ABNORMAL HIGH (ref 70–99)

## 2012-01-07 MED ORDER — BISACODYL 10 MG RE SUPP
10.0000 mg | Freq: Once | RECTAL | Status: AC
  Administered 2012-01-07: 10 mg via RECTAL
  Filled 2012-01-07: qty 1

## 2012-01-07 MED ORDER — WARFARIN SODIUM 5 MG PO TABS
5.0000 mg | ORAL_TABLET | Freq: Once | ORAL | Status: DC
  Filled 2012-01-07: qty 1

## 2012-01-07 NOTE — Progress Notes (Signed)
Pt d/c'ed to [facility] via [facility] in stable condition. All belongings sent with patient

## 2012-01-07 NOTE — Progress Notes (Signed)
Subjective: 3 Days Post-Op Procedure(s) (LRB): TOTAL KNEE ARTHROPLASTY (Right) Patient reports pain as 4 on 0-10 scale.    Objective: Vital signs in last 24 hours: Temp:  [97.5 F (36.4 C)-99.7 F (37.6 C)] 97.5 F (36.4 C) (03/09 0547) Pulse Rate:  [92-96] 92  (03/09 0547) Resp:  [20] 20  (03/09 0547) BP: (114-127)/(67-70) 127/70 mmHg (03/09 0547) SpO2:  [94 %] 94 % (03/09 0547)  Intake/Output from previous day: 03/08 0701 - 03/09 0700 In: 480 [P.O.:480] Out: 100 [Urine:100] Intake/Output this shift:     Basename 01/07/12 0610 01/06/12 0525 01/05/12 0530  HGB 7.5* 7.8* 8.0*    Basename 01/07/12 0610 01/06/12 0525  WBC 6.3 6.3  RBC 2.52* 2.60*  HCT 22.9* 23.6*  PLT 159 169    Basename 01/07/12 0610 01/06/12 0525  NA 135 134*  K 4.8 4.6  CL 106 106  CO2 21 20  BUN 31* 24*  CREATININE 1.52* 1.26*  GLUCOSE 103* 118*  CALCIUM 8.3* 8.2*    Basename 01/07/12 0610 01/06/12 0525  LABPT -- --  INR 1.68* 1.45    Neurologically intact  Assessment/Plan: 3 Days Post-Op Procedure(s) (LRB): TOTAL KNEE ARTHROPLASTY (Right) Discharge to SNF  Terese Heier C 01/07/2012, 9:29 AM

## 2012-01-07 NOTE — Progress Notes (Signed)
PT TREATMENT NOTE  01/07/12 1015  PT Visit Information  Last PT Received On 01/07/12  Precautions  Precautions Knee  Required Braces or Orthoses No  Restrictions  Weight Bearing Restrictions Yes  RLE Weight Bearing WBAT  Bed Mobility  Bed Mobility Yes  Supine to Sit 4: Min assist;With rails;HOB elevated (Comment degrees);Other (comment) (HOB 35 degrees)  Supine to Sit Details (indicate cue type and reason) taught pt. use of sheet to assist right LE; she still needed min assist for LE  Sit to Supine 3: Mod assist;HOB flat;With rail  Sit to Supine - Details (indicate cue type and reason) mod assist for right LE up to bed  Transfers  Transfers Yes  Sit to Stand 3: Mod assist;From elevated surface;With upper extremity assist;With armrests;From bed;From chair/3-in-1  Sit to Stand Details (indicate cue type and reason) vc's to rock forward to build momnetum; safety and technique cues  Stand to Sit 3: Mod assist;With upper extremity assist;With armrests;To bed;To chair/3-in-1  Stand to Sit Details safety and hand placement cues  Ambulation/Gait  Ambulation/Gait Yes  Ambulation/Gait Assistance 4: Min assist;Other (comment) (2nd person available in room for safety due to pt. low hgb)  Ambulation/Gait Assistance Details (indicate cue type and reason) cues to keep right LE in neutral when amb. and for sequence  Ambulation Distance (Feet) 30 Feet  Assistive device Rolling walker  Gait Pattern Step-to pattern;Trunk flexed  Stairs No  PT - End of Session  Equipment Utilized During Treatment Gait belt  Activity Tolerance Patient limited by fatigue;Patient limited by pain  Patient left with call bell in reach;in bed  Nurse Communication Mobility status for transfers;Mobility status for ambulation;Weight bearing status  General  Behavior During Session [facility] for tasks performed  Cognition [facility] for tasks performed  PT - Assessment/Plan  Comments on Treatment Session Pt. weaker on 2nd walk of day,  though no dizziness/lightheadedness.  Discussed pt's symptoms with RN Carollee Herter.  PT Plan Discharge plan remains appropriate  PT Frequency 7X/week  Follow Up Recommendations Skilled nursing facility  Equipment Recommended Defer to next venue  Acute Rehab PT Goals  PT Goal: Supine/Side to Sit - Progress Progressing toward goal  PT Goal: Sit to Supine/Side - Progress Progressing toward goal  PT Goal: Sit to Stand - Progress Progressing toward goal  PT Goal: Stand to Sit - Progress Progressing toward goal  PT Goal: Ambulate - Progress Progressing toward goal   Weldon Picking PT Acute Rehab Services [phone] Beeper [phone]

## 2012-01-07 NOTE — Progress Notes (Signed)
PT TREATMENT NOTE  01/07/12 0756  PT Visit Information  Last PT Received On 01/07/12  Precautions  Precautions Knee  Precaution Booklet Issued (reviewed knee precautions with pt.)  Required Braces or Orthoses No  Restrictions  Weight Bearing Restrictions Yes  RLE Weight Bearing WBAT  Bed Mobility  Bed Mobility No  Transfers  Transfers Yes  Sit to Stand 3: Mod assist;From chair/3-in-1;With armrests  Sit to Stand Details (indicate cue type and reason) vc's for correct hand placement and safety  Stand to Sit 3: Mod assist;With armrests;To chair/3-in-1  Stand to Sit Details vc's for safe hand placement and technique  Ambulation/Gait  Ambulation/Gait Yes  Ambulation/Gait Assistance 4: Min assist  Ambulation/Gait Assistance Details (indicate cue type and reason) vc's for safety and step length  Ambulation Distance (Feet) 22 Feet (second person to push recliner behind pt. during walk)  Assistive device Rolling walker  Gait Pattern Step-to pattern;Trunk flexed  Stairs No  Exercises  Exercises Total Joint  Total Joint Exercises  Ankle Circles/Pumps AROM;Both;10 reps;Seated  Quad Sets AROM;Right;10 reps;Seated  Knee Flexion AAROM;Seated;Right;Other (comment) (knee AAROM -5-90)  Short Arc Quad AAROM;10 reps;Right;Seated  Long Arc [employer];Right;5 reps;Seated  PT - End of Session  Equipment Utilized During Treatment Gait belt  Activity Tolerance Patient limited by fatigue;Patient limited by pain (pt. reports feeling weaker today; hgb 7.5)  Patient left in chair;with call bell in reach  Nurse Communication Mobility status for transfers;Mobility status for ambulation;Weight bearing status  General  Behavior During Session [facility] for tasks performed  Cognition [facility] for tasks performed  PT - Assessment/Plan  Comments on Treatment Session Pt. requiring less physical assist today but activity tolerance decreased likely due to decreasing hgb. (7.5 today).  Overall improving her mobility  with each session.  Should do well with rehab at [facility] and [facility]. I level.  PT Plan Discharge plan remains appropriate  PT Frequency 7X/week  Follow Up Recommendations Skilled nursing facility  Equipment Recommended Defer to next venue  Acute Rehab PT Goals  PT Goal: Sit to Stand - Progress Progressing toward goal  PT Goal: Stand to Sit - Progress Progressing toward goal  PT Goal: Ambulate - Progress Progressing toward goal  PT Goal: Perform Home Exercise Program - Progress Progressing toward goal   Weldon Picking PT Acute Rehab Services [phone] Beeper [phone]

## 2012-01-07 NOTE — Progress Notes (Signed)
ANTICOAGULATION CONSULT NOTE - Follow Up Consult  Pharmacy Consult for coumadin Indication: VTE pxl s/p TKA  Allergies  Allergen Reactions  . Darvon Other (See Comments)    Gets her "high"    Patient Measurements:  Vital Signs: Temp: 97.5 F (36.4 C) (03/09 0547) BP: 127/70 mmHg (03/09 0547) Pulse Rate: 92  (03/09 0547)  Labs:  Basename 01/07/12 0610 01/06/12 0525 01/05/12 0530  HGB 7.5* 7.8* --  HCT 22.9* 23.6* 25.0*  PLT 159 169 200  APTT -- -- --  LABPROT 20.1* 17.9* 15.3*  INR 1.68* 1.45 1.18  HEPARINUNFRC -- -- --  CREATININE 1.52* 1.26* 1.47*  CKTOTAL -- -- --  CKMB -- -- --  TROPONINI -- -- --   CrCl is unknown because there is no height on file for the current visit.   Medications:  Scheduled:     . bisacodyl  10 mg Rectal Once  . Chlorhexidine Gluconate Cloth  6 each Topical Daily  . diazepam  2 mg Oral Daily  . ferrous sulfate  325 mg Oral TID PC  . insulin aspart  0-15 Units Subcutaneous TID WC  . irbesartan  300 mg Oral QHS  . levothyroxine  100 mcg Oral Q breakfast  . linagliptin  5 mg Oral Q breakfast  . mupirocin ointment  1 application Nasal BID  . pioglitazone  30 mg Oral Q breakfast  . tamoxifen  20 mg Oral Daily  . triamterene-hydrochlorothiazide  1 each Oral Daily  . warfarin  5 mg Oral ONCE-1800  . warfarin   Does not apply Once  . Warfarin - Pharmacist Dosing Inpatient   Does not apply q1800    Assessment: 71 yo F on warfarin for VTE pxl s/p R TKA 3/6. Warfarin points: 3. INR up to 1.68 today. Hgb down to 7.5  ID: Tmax 99.7. WBC 6.3. No antibiotics.  Cards: Max BP 145/56 today on Maxzide, Avapro.  Endo: Hypothyroid on Levothyroxine DM: CBGs 100-130 on SSI, Linagliptin, Actos  Neuro: Diazepam  Renal: Scr down from 1.47 to 1.26 now back to 1.52 (Watch on Avapro)  Neuro: Daily scheduled Valium  Heme/Onc: FESO4 TID, Tamoxifen,  Goal of Therapy:  INR 2-3  Plan:  Warfarin 5 mg po x1 again today. Daily PT/INR Discharge  summary dictated by Lajoyce Corners at 1mg /d.   Misty Stanley, PharmD, BCPS

## 2012-01-10 DIAGNOSIS — I10 Essential (primary) hypertension: Secondary | ICD-10-CM | POA: Diagnosis not present

## 2012-01-10 DIAGNOSIS — Z7901 Long term (current) use of anticoagulants: Secondary | ICD-10-CM | POA: Diagnosis not present

## 2012-01-10 DIAGNOSIS — E039 Hypothyroidism, unspecified: Secondary | ICD-10-CM | POA: Diagnosis not present

## 2012-01-10 DIAGNOSIS — D62 Acute posthemorrhagic anemia: Secondary | ICD-10-CM | POA: Diagnosis not present

## 2012-01-10 DIAGNOSIS — M1990 Unspecified osteoarthritis, unspecified site: Secondary | ICD-10-CM | POA: Diagnosis not present

## 2012-01-10 DIAGNOSIS — E875 Hyperkalemia: Secondary | ICD-10-CM | POA: Diagnosis not present

## 2012-01-11 DIAGNOSIS — E039 Hypothyroidism, unspecified: Secondary | ICD-10-CM | POA: Diagnosis not present

## 2012-01-11 DIAGNOSIS — I10 Essential (primary) hypertension: Secondary | ICD-10-CM | POA: Diagnosis not present

## 2012-01-11 DIAGNOSIS — E119 Type 2 diabetes mellitus without complications: Secondary | ICD-10-CM | POA: Diagnosis not present

## 2012-01-17 DIAGNOSIS — Z7901 Long term (current) use of anticoagulants: Secondary | ICD-10-CM | POA: Diagnosis not present

## 2012-01-17 DIAGNOSIS — M1990 Unspecified osteoarthritis, unspecified site: Secondary | ICD-10-CM | POA: Diagnosis not present

## 2012-01-20 DIAGNOSIS — M1990 Unspecified osteoarthritis, unspecified site: Secondary | ICD-10-CM | POA: Diagnosis not present

## 2012-01-20 DIAGNOSIS — Z7901 Long term (current) use of anticoagulants: Secondary | ICD-10-CM | POA: Diagnosis not present

## 2012-01-23 DIAGNOSIS — Z7901 Long term (current) use of anticoagulants: Secondary | ICD-10-CM | POA: Diagnosis not present

## 2012-01-23 DIAGNOSIS — E039 Hypothyroidism, unspecified: Secondary | ICD-10-CM | POA: Diagnosis not present

## 2012-01-23 DIAGNOSIS — D62 Acute posthemorrhagic anemia: Secondary | ICD-10-CM | POA: Diagnosis not present

## 2012-01-23 DIAGNOSIS — I10 Essential (primary) hypertension: Secondary | ICD-10-CM | POA: Diagnosis not present

## 2012-01-23 DIAGNOSIS — M1990 Unspecified osteoarthritis, unspecified site: Secondary | ICD-10-CM | POA: Diagnosis not present

## 2012-01-24 DIAGNOSIS — M1990 Unspecified osteoarthritis, unspecified site: Secondary | ICD-10-CM | POA: Diagnosis not present

## 2012-01-24 DIAGNOSIS — Z7901 Long term (current) use of anticoagulants: Secondary | ICD-10-CM | POA: Diagnosis not present

## 2012-01-25 DIAGNOSIS — Z7901 Long term (current) use of anticoagulants: Secondary | ICD-10-CM | POA: Diagnosis not present

## 2012-01-25 DIAGNOSIS — M1990 Unspecified osteoarthritis, unspecified site: Secondary | ICD-10-CM | POA: Diagnosis not present

## 2012-01-26 DIAGNOSIS — E875 Hyperkalemia: Secondary | ICD-10-CM | POA: Diagnosis not present

## 2012-01-28 DIAGNOSIS — I129 Hypertensive chronic kidney disease with stage 1 through stage 4 chronic kidney disease, or unspecified chronic kidney disease: Secondary | ICD-10-CM | POA: Diagnosis not present

## 2012-01-28 DIAGNOSIS — N189 Chronic kidney disease, unspecified: Secondary | ICD-10-CM | POA: Diagnosis not present

## 2012-01-28 DIAGNOSIS — Z471 Aftercare following joint replacement surgery: Secondary | ICD-10-CM | POA: Diagnosis not present

## 2012-01-28 DIAGNOSIS — E119 Type 2 diabetes mellitus without complications: Secondary | ICD-10-CM | POA: Diagnosis not present

## 2012-01-28 DIAGNOSIS — C50919 Malignant neoplasm of unspecified site of unspecified female breast: Secondary | ICD-10-CM | POA: Diagnosis not present

## 2012-01-30 DIAGNOSIS — C50919 Malignant neoplasm of unspecified site of unspecified female breast: Secondary | ICD-10-CM | POA: Diagnosis not present

## 2012-01-30 DIAGNOSIS — I129 Hypertensive chronic kidney disease with stage 1 through stage 4 chronic kidney disease, or unspecified chronic kidney disease: Secondary | ICD-10-CM | POA: Diagnosis not present

## 2012-01-30 DIAGNOSIS — N189 Chronic kidney disease, unspecified: Secondary | ICD-10-CM | POA: Diagnosis not present

## 2012-01-30 DIAGNOSIS — E119 Type 2 diabetes mellitus without complications: Secondary | ICD-10-CM | POA: Diagnosis not present

## 2012-01-30 DIAGNOSIS — Z471 Aftercare following joint replacement surgery: Secondary | ICD-10-CM | POA: Diagnosis not present

## 2012-01-31 DIAGNOSIS — N189 Chronic kidney disease, unspecified: Secondary | ICD-10-CM | POA: Diagnosis not present

## 2012-01-31 DIAGNOSIS — Z471 Aftercare following joint replacement surgery: Secondary | ICD-10-CM | POA: Diagnosis not present

## 2012-01-31 DIAGNOSIS — I129 Hypertensive chronic kidney disease with stage 1 through stage 4 chronic kidney disease, or unspecified chronic kidney disease: Secondary | ICD-10-CM | POA: Diagnosis not present

## 2012-01-31 DIAGNOSIS — C50919 Malignant neoplasm of unspecified site of unspecified female breast: Secondary | ICD-10-CM | POA: Diagnosis not present

## 2012-01-31 DIAGNOSIS — E119 Type 2 diabetes mellitus without complications: Secondary | ICD-10-CM | POA: Diagnosis not present

## 2012-02-01 DIAGNOSIS — I129 Hypertensive chronic kidney disease with stage 1 through stage 4 chronic kidney disease, or unspecified chronic kidney disease: Secondary | ICD-10-CM | POA: Diagnosis not present

## 2012-02-01 DIAGNOSIS — E119 Type 2 diabetes mellitus without complications: Secondary | ICD-10-CM | POA: Diagnosis not present

## 2012-02-01 DIAGNOSIS — N189 Chronic kidney disease, unspecified: Secondary | ICD-10-CM | POA: Diagnosis not present

## 2012-02-01 DIAGNOSIS — C50919 Malignant neoplasm of unspecified site of unspecified female breast: Secondary | ICD-10-CM | POA: Diagnosis not present

## 2012-02-01 DIAGNOSIS — Z471 Aftercare following joint replacement surgery: Secondary | ICD-10-CM | POA: Diagnosis not present

## 2012-02-03 DIAGNOSIS — C50919 Malignant neoplasm of unspecified site of unspecified female breast: Secondary | ICD-10-CM | POA: Diagnosis not present

## 2012-02-03 DIAGNOSIS — E119 Type 2 diabetes mellitus without complications: Secondary | ICD-10-CM | POA: Diagnosis not present

## 2012-02-03 DIAGNOSIS — I129 Hypertensive chronic kidney disease with stage 1 through stage 4 chronic kidney disease, or unspecified chronic kidney disease: Secondary | ICD-10-CM | POA: Diagnosis not present

## 2012-02-03 DIAGNOSIS — Z471 Aftercare following joint replacement surgery: Secondary | ICD-10-CM | POA: Diagnosis not present

## 2012-02-03 DIAGNOSIS — N189 Chronic kidney disease, unspecified: Secondary | ICD-10-CM | POA: Diagnosis not present

## 2012-02-06 DIAGNOSIS — N189 Chronic kidney disease, unspecified: Secondary | ICD-10-CM | POA: Diagnosis not present

## 2012-02-06 DIAGNOSIS — E119 Type 2 diabetes mellitus without complications: Secondary | ICD-10-CM | POA: Diagnosis not present

## 2012-02-06 DIAGNOSIS — Z471 Aftercare following joint replacement surgery: Secondary | ICD-10-CM | POA: Diagnosis not present

## 2012-02-06 DIAGNOSIS — C50919 Malignant neoplasm of unspecified site of unspecified female breast: Secondary | ICD-10-CM | POA: Diagnosis not present

## 2012-02-06 DIAGNOSIS — I129 Hypertensive chronic kidney disease with stage 1 through stage 4 chronic kidney disease, or unspecified chronic kidney disease: Secondary | ICD-10-CM | POA: Diagnosis not present

## 2012-02-07 DIAGNOSIS — C50919 Malignant neoplasm of unspecified site of unspecified female breast: Secondary | ICD-10-CM | POA: Diagnosis not present

## 2012-02-07 DIAGNOSIS — I129 Hypertensive chronic kidney disease with stage 1 through stage 4 chronic kidney disease, or unspecified chronic kidney disease: Secondary | ICD-10-CM | POA: Diagnosis not present

## 2012-02-07 DIAGNOSIS — E119 Type 2 diabetes mellitus without complications: Secondary | ICD-10-CM | POA: Diagnosis not present

## 2012-02-07 DIAGNOSIS — N189 Chronic kidney disease, unspecified: Secondary | ICD-10-CM | POA: Diagnosis not present

## 2012-02-07 DIAGNOSIS — Z471 Aftercare following joint replacement surgery: Secondary | ICD-10-CM | POA: Diagnosis not present

## 2012-02-08 DIAGNOSIS — C50919 Malignant neoplasm of unspecified site of unspecified female breast: Secondary | ICD-10-CM | POA: Diagnosis not present

## 2012-02-08 DIAGNOSIS — N189 Chronic kidney disease, unspecified: Secondary | ICD-10-CM | POA: Diagnosis not present

## 2012-02-08 DIAGNOSIS — E1129 Type 2 diabetes mellitus with other diabetic kidney complication: Secondary | ICD-10-CM | POA: Diagnosis not present

## 2012-02-08 DIAGNOSIS — Z471 Aftercare following joint replacement surgery: Secondary | ICD-10-CM | POA: Diagnosis not present

## 2012-02-08 DIAGNOSIS — E119 Type 2 diabetes mellitus without complications: Secondary | ICD-10-CM | POA: Diagnosis not present

## 2012-02-08 DIAGNOSIS — I10 Essential (primary) hypertension: Secondary | ICD-10-CM | POA: Diagnosis not present

## 2012-02-08 DIAGNOSIS — E785 Hyperlipidemia, unspecified: Secondary | ICD-10-CM | POA: Diagnosis not present

## 2012-02-08 DIAGNOSIS — I129 Hypertensive chronic kidney disease with stage 1 through stage 4 chronic kidney disease, or unspecified chronic kidney disease: Secondary | ICD-10-CM | POA: Diagnosis not present

## 2012-02-08 DIAGNOSIS — E039 Hypothyroidism, unspecified: Secondary | ICD-10-CM | POA: Diagnosis not present

## 2012-02-09 DIAGNOSIS — I129 Hypertensive chronic kidney disease with stage 1 through stage 4 chronic kidney disease, or unspecified chronic kidney disease: Secondary | ICD-10-CM | POA: Diagnosis not present

## 2012-02-09 DIAGNOSIS — Z471 Aftercare following joint replacement surgery: Secondary | ICD-10-CM | POA: Diagnosis not present

## 2012-02-09 DIAGNOSIS — N189 Chronic kidney disease, unspecified: Secondary | ICD-10-CM | POA: Diagnosis not present

## 2012-02-09 DIAGNOSIS — C50919 Malignant neoplasm of unspecified site of unspecified female breast: Secondary | ICD-10-CM | POA: Diagnosis not present

## 2012-02-09 DIAGNOSIS — E119 Type 2 diabetes mellitus without complications: Secondary | ICD-10-CM | POA: Diagnosis not present

## 2012-02-10 DIAGNOSIS — N189 Chronic kidney disease, unspecified: Secondary | ICD-10-CM | POA: Diagnosis not present

## 2012-02-10 DIAGNOSIS — Z471 Aftercare following joint replacement surgery: Secondary | ICD-10-CM | POA: Diagnosis not present

## 2012-02-10 DIAGNOSIS — E119 Type 2 diabetes mellitus without complications: Secondary | ICD-10-CM | POA: Diagnosis not present

## 2012-02-10 DIAGNOSIS — C50919 Malignant neoplasm of unspecified site of unspecified female breast: Secondary | ICD-10-CM | POA: Diagnosis not present

## 2012-02-10 DIAGNOSIS — I129 Hypertensive chronic kidney disease with stage 1 through stage 4 chronic kidney disease, or unspecified chronic kidney disease: Secondary | ICD-10-CM | POA: Diagnosis not present

## 2012-02-13 ENCOUNTER — Encounter: Payer: Self-pay | Admitting: Physician Assistant

## 2012-02-13 ENCOUNTER — Ambulatory Visit (HOSPITAL_BASED_OUTPATIENT_CLINIC_OR_DEPARTMENT_OTHER): Payer: Medicare Other | Admitting: Physician Assistant

## 2012-02-13 ENCOUNTER — Telehealth: Payer: Self-pay | Admitting: *Deleted

## 2012-02-13 VITALS — BP 125/75 | HR 82 | Temp 97.5°F

## 2012-02-13 DIAGNOSIS — D649 Anemia, unspecified: Secondary | ICD-10-CM

## 2012-02-13 DIAGNOSIS — C50219 Malignant neoplasm of upper-inner quadrant of unspecified female breast: Secondary | ICD-10-CM

## 2012-02-13 DIAGNOSIS — C50919 Malignant neoplasm of unspecified site of unspecified female breast: Secondary | ICD-10-CM

## 2012-02-13 DIAGNOSIS — I129 Hypertensive chronic kidney disease with stage 1 through stage 4 chronic kidney disease, or unspecified chronic kidney disease: Secondary | ICD-10-CM | POA: Diagnosis not present

## 2012-02-13 DIAGNOSIS — Z471 Aftercare following joint replacement surgery: Secondary | ICD-10-CM | POA: Diagnosis not present

## 2012-02-13 DIAGNOSIS — Z170 Estrogen receptor positive status [ER+]: Secondary | ICD-10-CM

## 2012-02-13 DIAGNOSIS — N189 Chronic kidney disease, unspecified: Secondary | ICD-10-CM | POA: Diagnosis not present

## 2012-02-13 DIAGNOSIS — E119 Type 2 diabetes mellitus without complications: Secondary | ICD-10-CM | POA: Diagnosis not present

## 2012-02-13 NOTE — Progress Notes (Signed)
Hematology and Oncology Follow Up Visit  Autumn Chambers [id number] Jan 21, 1941 71 y.o. 02/13/2012    HPI:  Autumn Chambers is a 71 year old [place] woman with a history of a T1c, N0, ER/PR positive left breast carcinoma, s/p left lumpectomy with sentinel node dissection on 05/29/2008 followed by radiation therapy completed 09/03/2008. Currently on tamoxifen 20 mg by mouth daily.  Interim History:   Autumn Chambers is seen today with her sister-in-law and accompaniment for followup pertaining to her history of a stage I, ER/PR positive left breast carcinoma. She continues on tamoxifen 20 mg by mouth daily. Since her last office visit on 06/07/2011, she has undergone a total right hip replacement on 01/04/2012. She is recovering beautifully. She denies any difficulty in the postoperative setting. She denies fevers, chills, night sweats, no shortness of breath or chest pain. She denies hot flashes. She denies any increased lower extremity edema. She denies any palpable venous cords. She will be due for her annual bilateral breast mammogram at [facility] in may 2013.  A detailed review of systems is otherwise noncontributory as noted below.  Review of Systems: Constitutional:  no weight loss, fever, night sweats Eyes: uses glasses ENT: no complaints Cardiovascular: no chest pain or dyspnea on exertion Respiratory: no cough, shortness of breath, or wheezing Neurological: no TIA or stroke symptoms Dermatological: negative Gastrointestinal: no abdominal pain, change in bowel habits, or black or bloody stools Genito-Urinary: no dysuria, trouble voiding, or hematuria Hematological and Lymphatic: negative Breast: negative Musculoskeletal: positive for - recent R knee replacement. Remaining ROS negative.   Medications:   I have reviewed the patient's current medications.  Current Outpatient Prescriptions  Medication Sig Dispense Refill  . alendronate (FOSAMAX) 70 MG tablet Take 70 mg by  mouth every 7 (seven) days. Take with a full glass of water on an empty stomach.      Marland Kitchen CALCIUM PO Take 1,500 mg by mouth 2 (two) times daily.      . celecoxib (CELEBREX) 200 MG capsule Take 200 mg by mouth daily.      . cholecalciferol (VITAMIN D) 1000 UNITS tablet Take 1,000 Units by mouth daily.      . diazepam (VALIUM) 2 MG tablet Take 2 mg by mouth daily.      . diphenhydramine-acetaminophen (TYLENOL PM) 25-500 MG TABS Take 1 tablet by mouth at bedtime as needed. For sleep      . Glucosamine 500 MG CAPS Take 1 capsule by mouth 3 (three) times daily.      . irbesartan (AVAPRO) 300 MG tablet Take 300 mg by mouth at bedtime.      Marland Kitchen levothyroxine (SYNTHROID, LEVOTHROID) 100 MCG tablet Take 100 mcg by mouth daily.      . pioglitazone (ACTOS) 30 MG tablet Take 30 mg by mouth daily.      . sitaGLIPtin (JANUVIA) 100 MG tablet Take 100 mg by mouth daily.      . tamoxifen (NOLVADEX) 20 MG tablet Take 20 mg by mouth daily.      Marland Kitchen triamterene-hydrochlorothiazide (MAXZIDE-25) 37.5-25 MG per tablet Take 1 tablet by mouth daily.        Allergies:  Allergies  Allergen Reactions  . Darvon Other (See Comments)    Gets her "high"    Physical Exam: Filed Vitals:   02/13/12 1020  BP: 125/75  Pulse: 82  Temp: 97.5 F (36.4 C)    There is no height or weight on file to calculate BMI. Weight: not obtained. HEENT:  Sclerae  anicteric, conjunctivae pink.  Oropharynx clear.  No mucositis or candidiasis.   Nodes:  No cervical, supraclavicular, or axillary lymphadenopathy palpated.  Breast Exam:  The left breast lumpectomy scar in the upper inner quadrant was examined, there is no dominant mass effect, no evidence of nipple inversion or discharge. Axilla is clear. The right breast is free of masses, skin changes nipple inversion or discharge. Axilla is also clear.  Lungs:  Clear to auscultation bilaterally.  No crackles, rhonchi, or wheezes.   Heart:  Regular rate and rhythm.   Abdomen:  Soft, nontender.   Positive bowel sounds.  No organomegaly or masses palpated.   Musculoskeletal:  No focal spinal tenderness to palpation.  Extremities:  Benign.  No peripheral edema or cyanosis.   Skin:  Benign.   Neuro:  Nonfocal, alert and oriented x 3.   Lab Results: Lab Results  Component Value Date   WBC 6.3 01/07/2012   HGB 7.5* 01/07/2012   HCT 22.9* 01/07/2012   MCV 90.9 01/07/2012   PLT 159 01/07/2012   NEUTROABS 3.8 05/31/2011     Chemistry      Component Value Date/Time   NA 135 01/07/2012 0610   K 4.8 01/07/2012 0610   CL 106 01/07/2012 0610   CO2 21 01/07/2012 0610   BUN 31* 01/07/2012 0610   CREATININE 1.52* 01/07/2012 0610      Component Value Date/Time   CALCIUM 8.3* 01/07/2012 0610   ALKPHOS 64 12/21/2011 1345   AST 17 12/21/2011 1345   ALT 13 12/21/2011 1345   BILITOT 0.3 12/21/2011 1345      Lab Results  Component Value Date   LABCA2 35 11/16/2010     Assessment:  Ms. Autumn Chambers is a 71 year old [place] woman with a history of a T1c, N0, ER/PR positive left breast carcinoma, s/p left lumpectomy with sentinel node dissection on 05/29/2008 followed by radiation therapy completed 09/03/2008. Currently on tamoxifen 20 mg by mouth daily. No evidence of recurrent or metastatic disease.  Case reviewed with Dr. Pierce Chambers.   Plan:  Autumn Chambers will continue on tamoxifen 20 mg by mouth daily. We will schedule her annual bilateral breast mammogram [facility] from May 2013. She will return to see [place] in 6 months, with a CBC, serum chemistry, LDH, and CA 27-29 being obtained prior. She knows to contact us in the interim if the need should arise.  This plan was reviewed with the patient, who voices understanding and agreement.  She knows to call with any changes or problems.    Autumn Chambers T, PA-C 02/13/2012

## 2012-02-13 NOTE — Telephone Encounter (Signed)
made patient appointment for mammogram on 02-29-2012 at 1:00pm gave patient appointment for 07-31-2012 starting at 1:30pm printed out calendar and gave to the patient

## 2012-02-14 DIAGNOSIS — C50919 Malignant neoplasm of unspecified site of unspecified female breast: Secondary | ICD-10-CM | POA: Diagnosis not present

## 2012-02-14 DIAGNOSIS — N189 Chronic kidney disease, unspecified: Secondary | ICD-10-CM | POA: Diagnosis not present

## 2012-02-14 DIAGNOSIS — I129 Hypertensive chronic kidney disease with stage 1 through stage 4 chronic kidney disease, or unspecified chronic kidney disease: Secondary | ICD-10-CM | POA: Diagnosis not present

## 2012-02-14 DIAGNOSIS — E119 Type 2 diabetes mellitus without complications: Secondary | ICD-10-CM | POA: Diagnosis not present

## 2012-02-14 DIAGNOSIS — Z471 Aftercare following joint replacement surgery: Secondary | ICD-10-CM | POA: Diagnosis not present

## 2012-02-15 DIAGNOSIS — Z471 Aftercare following joint replacement surgery: Secondary | ICD-10-CM | POA: Diagnosis not present

## 2012-02-15 DIAGNOSIS — C50919 Malignant neoplasm of unspecified site of unspecified female breast: Secondary | ICD-10-CM | POA: Diagnosis not present

## 2012-02-15 DIAGNOSIS — N189 Chronic kidney disease, unspecified: Secondary | ICD-10-CM | POA: Diagnosis not present

## 2012-02-15 DIAGNOSIS — E119 Type 2 diabetes mellitus without complications: Secondary | ICD-10-CM | POA: Diagnosis not present

## 2012-02-15 DIAGNOSIS — I129 Hypertensive chronic kidney disease with stage 1 through stage 4 chronic kidney disease, or unspecified chronic kidney disease: Secondary | ICD-10-CM | POA: Diagnosis not present

## 2012-02-17 DIAGNOSIS — N189 Chronic kidney disease, unspecified: Secondary | ICD-10-CM | POA: Diagnosis not present

## 2012-02-17 DIAGNOSIS — Z471 Aftercare following joint replacement surgery: Secondary | ICD-10-CM | POA: Diagnosis not present

## 2012-02-17 DIAGNOSIS — E119 Type 2 diabetes mellitus without complications: Secondary | ICD-10-CM | POA: Diagnosis not present

## 2012-02-17 DIAGNOSIS — C50919 Malignant neoplasm of unspecified site of unspecified female breast: Secondary | ICD-10-CM | POA: Diagnosis not present

## 2012-02-17 DIAGNOSIS — I129 Hypertensive chronic kidney disease with stage 1 through stage 4 chronic kidney disease, or unspecified chronic kidney disease: Secondary | ICD-10-CM | POA: Diagnosis not present

## 2012-03-02 DIAGNOSIS — H40019 Open angle with borderline findings, low risk, unspecified eye: Secondary | ICD-10-CM | POA: Diagnosis not present

## 2012-03-07 DIAGNOSIS — R928 Other abnormal and inconclusive findings on diagnostic imaging of breast: Secondary | ICD-10-CM | POA: Diagnosis not present

## 2012-03-22 ENCOUNTER — Other Ambulatory Visit: Payer: Self-pay | Admitting: Oncology

## 2012-03-22 DIAGNOSIS — C50219 Malignant neoplasm of upper-inner quadrant of unspecified female breast: Secondary | ICD-10-CM

## 2012-06-28 DIAGNOSIS — M79609 Pain in unspecified limb: Secondary | ICD-10-CM | POA: Diagnosis not present

## 2012-07-31 ENCOUNTER — Other Ambulatory Visit (HOSPITAL_BASED_OUTPATIENT_CLINIC_OR_DEPARTMENT_OTHER): Payer: Medicare Other | Admitting: Lab

## 2012-07-31 ENCOUNTER — Ambulatory Visit (HOSPITAL_BASED_OUTPATIENT_CLINIC_OR_DEPARTMENT_OTHER): Payer: Medicare Other | Admitting: Oncology

## 2012-07-31 VITALS — BP 130/77 | HR 83 | Temp 97.8°F | Resp 20 | Ht 68.0 in | Wt 253.4 lb

## 2012-07-31 DIAGNOSIS — E559 Vitamin D deficiency, unspecified: Secondary | ICD-10-CM | POA: Diagnosis not present

## 2012-07-31 DIAGNOSIS — C50919 Malignant neoplasm of unspecified site of unspecified female breast: Secondary | ICD-10-CM

## 2012-07-31 DIAGNOSIS — C50219 Malignant neoplasm of upper-inner quadrant of unspecified female breast: Secondary | ICD-10-CM

## 2012-07-31 DIAGNOSIS — Z170 Estrogen receptor positive status [ER+]: Secondary | ICD-10-CM | POA: Diagnosis not present

## 2012-07-31 LAB — COMPREHENSIVE METABOLIC PANEL (CC13)
Alkaline Phosphatase: 68 U/L (ref 40–150)
BUN: 27 mg/dL — ABNORMAL HIGH (ref 7.0–26.0)
CO2: 20 mEq/L — ABNORMAL LOW (ref 22–29)
Creatinine: 1.6 mg/dL — ABNORMAL HIGH (ref 0.6–1.1)
Glucose: 146 mg/dl — ABNORMAL HIGH (ref 70–99)
Sodium: 139 mEq/L (ref 136–145)
Total Bilirubin: 0.3 mg/dL (ref 0.20–1.20)
Total Protein: 7 g/dL (ref 6.4–8.3)

## 2012-07-31 LAB — CBC WITH DIFFERENTIAL/PLATELET
Basophils Absolute: 0 10*3/uL (ref 0.0–0.1)
EOS%: 2.9 % (ref 0.0–7.0)
Eosinophils Absolute: 0.2 10*3/uL (ref 0.0–0.5)
HGB: 9.9 g/dL — ABNORMAL LOW (ref 11.6–15.9)
LYMPH%: 21.1 % (ref 14.0–49.7)
MCH: 30.1 pg (ref 25.1–34.0)
MCV: 90.7 fL (ref 79.5–101.0)
MONO%: 7 % (ref 0.0–14.0)
NEUT#: 4.6 10*3/uL (ref 1.5–6.5)
Platelets: 221 10*3/uL (ref 145–400)
RDW: 15.3 % — ABNORMAL HIGH (ref 11.2–14.5)

## 2012-07-31 LAB — LACTATE DEHYDROGENASE (CC13): LDH: 206 U/L (ref 125–220)

## 2012-07-31 NOTE — Progress Notes (Signed)
Hematology and Oncology Follow Up Visit  Autumn Chambers [id number] 09-Jan-1941 71 y.o. 07/31/2012    HPI:  Ms. Birkhead is a 71 year old [place] woman with a history of a T1c, N0, ER/PR positive left breast carcinoma, s/p left lumpectomy with sentinel node dissection on 05/29/2008 followed by radiation therapy completed 09/03/2008. Currently on tamoxifen 20 mg by mouth daily.  Interim History:   Harlynn is seen today with her sister-in-law and accompaniment for followup pertaining to her history of a stage I, ER/PR positive left breast carcinoma. She continues on tamoxifen 20 mg by mouth daily. She is doing well. She will be due for her annual bilateral breast mammogram at [facility] in may 2013.she is heard of heareing.  A detailed review of systems is otherwise noncontributory as noted below.  Review of Systems: Constitutional:  no weight loss, fever, night sweats Eyes: uses glasses ENT: no complaints Cardiovascular: no chest pain or dyspnea on exertion Respiratory: no cough, shortness of breath, or wheezing Neurological: no TIA or stroke symptoms Dermatological: negative Gastrointestinal: no abdominal pain, change in bowel habits, or black or bloody stools Genito-Urinary: no dysuria, trouble voiding, or hematuria Hematological and Lymphatic: negative Breast: negative Musculoskeletal: positive for - recent R knee replacement. Remaining ROS negative.   Medications:   I have reviewed the patient's current medications.  Current Outpatient Prescriptions  Medication Sig Dispense Refill  . alendronate (FOSAMAX) 70 MG tablet Take 70 mg by mouth every 7 (seven) days. Take with a full glass of water on an empty stomach.      Marland Kitchen CALCIUM PO Take 1,500 mg by mouth 2 (two) times daily.      . celecoxib (CELEBREX) 200 MG capsule Take 200 mg by mouth daily.      . cholecalciferol (VITAMIN D) 1000 UNITS tablet Take 1,000 Units by mouth daily.      . diazepam (VALIUM) 2 MG tablet  Take 2 mg by mouth daily.      . diphenhydramine-acetaminophen (TYLENOL PM) 25-500 MG TABS Take 1 tablet by mouth at bedtime as needed. For sleep      . esomeprazole (NEXIUM) 10 MG packet Take 10 mg by mouth daily before breakfast.      . Glucosamine 500 MG CAPS Take 1 capsule by mouth 3 (three) times daily.      . irbesartan (AVAPRO) 300 MG tablet Take 300 mg by mouth at bedtime.      Marland Kitchen levothyroxine (SYNTHROID, LEVOTHROID) 100 MCG tablet Take 100 mcg by mouth daily.      . pioglitazone (ACTOS) 30 MG tablet Take 30 mg by mouth daily.      . sitaGLIPtin (JANUVIA) 100 MG tablet Take 100 mg by mouth daily.      . tamoxifen (NOLVADEX) 20 MG tablet TAKE 1 TABLET DAILY  90 tablet  1  . triamterene-hydrochlorothiazide (MAXZIDE-25) 37.5-25 MG per tablet Take 1 tablet by mouth daily.        Allergies:  Allergies  Allergen Reactions  . Darvon Other (See Comments)    Gets her "high"    Physical Exam: Filed Vitals:   07/31/12 1427  BP: 130/77  Pulse: 83  Temp: 97.8 F (36.6 C)  Resp: 20    Body mass index is 38.53 kg/(m^2). Weight: not obtained. HEENT:  Sclerae anicteric, conjunctivae pink.  Oropharynx clear.  No mucositis or candidiasis.   Nodes:  No cervical, supraclavicular, or axillary lymphadenopathy palpated.  Breast Exam:  The left breast lumpectomy scar in the upper inner  quadrant was examined, there is no dominant mass effect, no evidence of nipple inversion or discharge. Axilla is clear. The right breast is free of masses, skin changes nipple inversion or discharge. Axilla is also clear.  Lungs:  Clear to auscultation bilaterally.  No crackles, rhonchi, or wheezes.   Heart:  Regular rate and rhythm.   Abdomen:  Soft, nontender.  Positive bowel sounds.  No organomegaly or masses palpated.   Musculoskeletal:  No focal spinal tenderness to palpation.  Extremities:  Benign.  No peripheral edema or cyanosis.   Skin:  Benign.   Neuro:  Nonfocal, alert and oriented x 3.   Lab  Results: Lab Results  Component Value Date   WBC 6.7 07/31/2012   HGB 9.9* 07/31/2012   HCT 29.9* 07/31/2012   MCV 90.7 07/31/2012   PLT 221 07/31/2012   NEUTROABS 4.6 07/31/2012     Chemistry      Component Value Date/Time   NA 135 01/07/2012 0610   K 4.8 01/07/2012 0610   CL 106 01/07/2012 0610   CO2 21 01/07/2012 0610   BUN 31* 01/07/2012 0610   CREATININE 1.52* 01/07/2012 0610      Component Value Date/Time   CALCIUM 8.3* 01/07/2012 0610   ALKPHOS 64 12/21/2011 1345   AST 17 12/21/2011 1345   ALT 13 12/21/2011 1345   BILITOT 0.3 12/21/2011 1345      Lab Results  Component Value Date   LABCA2 35 11/16/2010     Assessment:  Ms. Spranger is a 71 year old [place] woman with a history of a T1c, N0, ER/PR positive left breast carcinoma, s/p left lumpectomy with sentinel node dissection on 05/29/2008 followed by radiation therapy completed 09/03/2008. Currently on tamoxifen 20 mg by mouth daily. No evidence of recurrent or metastatic disease.     Plan:  Roselynn will continue on tamoxifen 20 mg by mouth daily. We will schedule her annual bilateral breast mammogram [facility] from May 2013. She will return to see [place] in 6 months, with a CBC, serum chemistry, LDH, and CA 27-29 being obtained prior. She knows to contact us in the interim if the need should arise.I will see her in 1 yr.  This plan was reviewed with the patient, who voices understanding and agreement.  She knows to call with any changes or problems.    Rihanna Marseille, md 07/31/2012

## 2012-08-23 DIAGNOSIS — E039 Hypothyroidism, unspecified: Secondary | ICD-10-CM | POA: Diagnosis not present

## 2012-08-23 DIAGNOSIS — Z23 Encounter for immunization: Secondary | ICD-10-CM | POA: Diagnosis not present

## 2012-08-23 DIAGNOSIS — R0602 Shortness of breath: Secondary | ICD-10-CM | POA: Diagnosis not present

## 2012-08-23 DIAGNOSIS — E1129 Type 2 diabetes mellitus with other diabetic kidney complication: Secondary | ICD-10-CM | POA: Diagnosis not present

## 2012-08-23 DIAGNOSIS — I10 Essential (primary) hypertension: Secondary | ICD-10-CM | POA: Diagnosis not present

## 2012-08-26 ENCOUNTER — Other Ambulatory Visit: Payer: Self-pay | Admitting: Oncology

## 2012-08-26 DIAGNOSIS — C50919 Malignant neoplasm of unspecified site of unspecified female breast: Secondary | ICD-10-CM

## 2012-08-29 DIAGNOSIS — R0602 Shortness of breath: Secondary | ICD-10-CM | POA: Diagnosis not present

## 2012-08-29 DIAGNOSIS — I10 Essential (primary) hypertension: Secondary | ICD-10-CM | POA: Diagnosis not present

## 2012-08-29 DIAGNOSIS — E1129 Type 2 diabetes mellitus with other diabetic kidney complication: Secondary | ICD-10-CM | POA: Diagnosis not present

## 2012-09-10 DIAGNOSIS — M1710 Unilateral primary osteoarthritis, unspecified knee: Secondary | ICD-10-CM | POA: Diagnosis not present

## 2012-09-10 DIAGNOSIS — M7500 Adhesive capsulitis of unspecified shoulder: Secondary | ICD-10-CM | POA: Diagnosis not present

## 2012-09-10 DIAGNOSIS — M67919 Unspecified disorder of synovium and tendon, unspecified shoulder: Secondary | ICD-10-CM | POA: Diagnosis not present

## 2012-09-24 DIAGNOSIS — L570 Actinic keratosis: Secondary | ICD-10-CM | POA: Diagnosis not present

## 2012-09-24 DIAGNOSIS — Z85828 Personal history of other malignant neoplasm of skin: Secondary | ICD-10-CM | POA: Diagnosis not present

## 2012-09-24 DIAGNOSIS — L821 Other seborrheic keratosis: Secondary | ICD-10-CM | POA: Diagnosis not present

## 2012-10-12 DIAGNOSIS — M1710 Unilateral primary osteoarthritis, unspecified knee: Secondary | ICD-10-CM | POA: Diagnosis not present

## 2012-10-12 DIAGNOSIS — M67919 Unspecified disorder of synovium and tendon, unspecified shoulder: Secondary | ICD-10-CM | POA: Diagnosis not present

## 2012-10-12 DIAGNOSIS — M719 Bursopathy, unspecified: Secondary | ICD-10-CM | POA: Diagnosis not present

## 2012-10-22 DIAGNOSIS — M25519 Pain in unspecified shoulder: Secondary | ICD-10-CM | POA: Diagnosis not present

## 2012-10-22 DIAGNOSIS — M6281 Muscle weakness (generalized): Secondary | ICD-10-CM | POA: Diagnosis not present

## 2012-10-22 DIAGNOSIS — M25669 Stiffness of unspecified knee, not elsewhere classified: Secondary | ICD-10-CM | POA: Diagnosis not present

## 2012-10-22 DIAGNOSIS — M7500 Adhesive capsulitis of unspecified shoulder: Secondary | ICD-10-CM | POA: Diagnosis not present

## 2012-10-25 DIAGNOSIS — M25519 Pain in unspecified shoulder: Secondary | ICD-10-CM | POA: Diagnosis not present

## 2012-10-25 DIAGNOSIS — E039 Hypothyroidism, unspecified: Secondary | ICD-10-CM | POA: Diagnosis not present

## 2012-11-06 DIAGNOSIS — M25519 Pain in unspecified shoulder: Secondary | ICD-10-CM | POA: Diagnosis not present

## 2012-11-06 DIAGNOSIS — M7500 Adhesive capsulitis of unspecified shoulder: Secondary | ICD-10-CM | POA: Diagnosis not present

## 2012-11-06 DIAGNOSIS — M6281 Muscle weakness (generalized): Secondary | ICD-10-CM | POA: Diagnosis not present

## 2012-11-06 DIAGNOSIS — M25669 Stiffness of unspecified knee, not elsewhere classified: Secondary | ICD-10-CM | POA: Diagnosis not present

## 2012-11-07 DIAGNOSIS — Z961 Presence of intraocular lens: Secondary | ICD-10-CM | POA: Diagnosis not present

## 2012-11-07 DIAGNOSIS — H52209 Unspecified astigmatism, unspecified eye: Secondary | ICD-10-CM | POA: Diagnosis not present

## 2012-11-07 DIAGNOSIS — H40059 Ocular hypertension, unspecified eye: Secondary | ICD-10-CM | POA: Diagnosis not present

## 2012-11-08 DIAGNOSIS — M6281 Muscle weakness (generalized): Secondary | ICD-10-CM | POA: Diagnosis not present

## 2012-11-08 DIAGNOSIS — M25519 Pain in unspecified shoulder: Secondary | ICD-10-CM | POA: Diagnosis not present

## 2012-11-08 DIAGNOSIS — M7500 Adhesive capsulitis of unspecified shoulder: Secondary | ICD-10-CM | POA: Diagnosis not present

## 2012-11-08 DIAGNOSIS — M25669 Stiffness of unspecified knee, not elsewhere classified: Secondary | ICD-10-CM | POA: Diagnosis not present

## 2012-11-12 DIAGNOSIS — M7500 Adhesive capsulitis of unspecified shoulder: Secondary | ICD-10-CM | POA: Diagnosis not present

## 2012-11-12 DIAGNOSIS — M6281 Muscle weakness (generalized): Secondary | ICD-10-CM | POA: Diagnosis not present

## 2012-11-12 DIAGNOSIS — M25669 Stiffness of unspecified knee, not elsewhere classified: Secondary | ICD-10-CM | POA: Diagnosis not present

## 2012-11-12 DIAGNOSIS — M25519 Pain in unspecified shoulder: Secondary | ICD-10-CM | POA: Diagnosis not present

## 2012-11-14 DIAGNOSIS — M6281 Muscle weakness (generalized): Secondary | ICD-10-CM | POA: Diagnosis not present

## 2012-11-14 DIAGNOSIS — M25669 Stiffness of unspecified knee, not elsewhere classified: Secondary | ICD-10-CM | POA: Diagnosis not present

## 2012-11-14 DIAGNOSIS — M7500 Adhesive capsulitis of unspecified shoulder: Secondary | ICD-10-CM | POA: Diagnosis not present

## 2012-11-14 DIAGNOSIS — M25519 Pain in unspecified shoulder: Secondary | ICD-10-CM | POA: Diagnosis not present

## 2012-11-19 DIAGNOSIS — M25519 Pain in unspecified shoulder: Secondary | ICD-10-CM | POA: Diagnosis not present

## 2012-11-19 DIAGNOSIS — M7500 Adhesive capsulitis of unspecified shoulder: Secondary | ICD-10-CM | POA: Diagnosis not present

## 2012-11-19 DIAGNOSIS — M25669 Stiffness of unspecified knee, not elsewhere classified: Secondary | ICD-10-CM | POA: Diagnosis not present

## 2012-11-19 DIAGNOSIS — M6281 Muscle weakness (generalized): Secondary | ICD-10-CM | POA: Diagnosis not present

## 2012-11-21 DIAGNOSIS — M6281 Muscle weakness (generalized): Secondary | ICD-10-CM | POA: Diagnosis not present

## 2012-11-21 DIAGNOSIS — M7500 Adhesive capsulitis of unspecified shoulder: Secondary | ICD-10-CM | POA: Diagnosis not present

## 2012-11-21 DIAGNOSIS — M25519 Pain in unspecified shoulder: Secondary | ICD-10-CM | POA: Diagnosis not present

## 2012-11-21 DIAGNOSIS — M25669 Stiffness of unspecified knee, not elsewhere classified: Secondary | ICD-10-CM | POA: Diagnosis not present

## 2012-12-06 ENCOUNTER — Ambulatory Visit (INDEPENDENT_AMBULATORY_CARE_PROVIDER_SITE_OTHER): Payer: Medicare Other | Admitting: Family Medicine

## 2012-12-06 VITALS — BP 137/80 | HR 99 | Temp 98.1°F | Resp 16 | Ht 68.0 in | Wt 257.0 lb

## 2012-12-06 DIAGNOSIS — W5501XA Bitten by cat, initial encounter: Secondary | ICD-10-CM

## 2012-12-06 DIAGNOSIS — Z23 Encounter for immunization: Secondary | ICD-10-CM | POA: Diagnosis not present

## 2012-12-06 DIAGNOSIS — M79609 Pain in unspecified limb: Secondary | ICD-10-CM | POA: Diagnosis not present

## 2012-12-06 DIAGNOSIS — M79602 Pain in left arm: Secondary | ICD-10-CM

## 2012-12-06 DIAGNOSIS — S51809A Unspecified open wound of unspecified forearm, initial encounter: Secondary | ICD-10-CM | POA: Diagnosis not present

## 2012-12-06 DIAGNOSIS — L089 Local infection of the skin and subcutaneous tissue, unspecified: Secondary | ICD-10-CM

## 2012-12-06 MED ORDER — AMOXICILLIN-POT CLAVULANATE 875-125 MG PO TABS: 1.0000 | ORAL_TABLET | Freq: Two times a day (BID) | ORAL | Status: DC

## 2012-12-06 NOTE — Progress Notes (Signed)
[address]   [place], [place]  [address]   [phone]  Subjective:    Patient ID: Autumn Chambers, female    DOB: 1941-05-18, 72 y.o.   MRN: [id number]  HPI This 72 y.o. female presents for evaluation of catbite.  Needs Tetanus vaccine.  Personal cat bit pt two days ago.  Cats vaccinations UTD: inside cats strictly.  Playing with cat; getting ready to comb other cat and bit pt.  Last Tetanus 1960. +localized swelling; +redness surrounding bite.  Cleansed with water; applied peroxide; also using topical antibiotic cream.  No drainage.  No pain in L axilla. R handed.  Review of Systems  Constitutional: Negative for fever, chills, diaphoresis and fatigue.  Skin: Positive for color change and wound. Negative for pallor and rash.  Neurological: Negative for weakness and numbness.        Past Medical History  Diagnosis Date  . PONV (postoperative nausea and vomiting)   . Hypertension   . Hyperlipidemia   . Shortness of breath     "mild upon exertion"  . Diabetes mellitus     oral  . Hypothyroidism   . Anemia   . Urinary tract infection     hx of  . H/O hiatal hernia   . Irritable bowel   . Cancer     hx of skin ca  . Arthritis     Past Surgical History  Procedure Date  . Skin cancer excision     approx 4 years ago  . Achilles tendon surgery   . Eye surgery     bilateral cataract surgery  . Tonsillectomy   . Dilation and curettage of uterus   . Breast lumpectomy     left, with 2 nodes removed  . Total knee arthroplasty 01/04/2012    Procedure: TOTAL KNEE ARTHROPLASTY;  Surgeon: Nadara Mustard, MD;  Location: [facility] OR;  Service: Orthopedics;  Laterality: Right;  Right Total Knee Arthroplasty    Prior to Admission medications   Medication Sig Start Date End Date Taking? Authorizing Provider  alendronate (FOSAMAX) 70 MG tablet Take 70 mg by mouth every 7 (seven) days. Take with a full glass of water on an empty stomach.   Yes Historical Provider, MD  CALCIUM PO Take  1,500 mg by mouth 2 (two) times daily.   Yes Historical Provider, MD  celecoxib (CELEBREX) 200 MG capsule Take 200 mg by mouth daily.   Yes Historical Provider, MD  cholecalciferol (VITAMIN D) 1000 UNITS tablet Take 1,000 Units by mouth daily.   Yes Historical Provider, MD  diazepam (VALIUM) 2 MG tablet Take 2 mg by mouth daily.   Yes Historical Provider, MD  diphenhydramine-acetaminophen (TYLENOL PM) 25-500 MG TABS Take 1 tablet by mouth at bedtime as needed. For sleep   Yes Historical Provider, MD  esomeprazole (NEXIUM) 10 MG packet Take 10 mg by mouth daily before breakfast.   Yes Historical Provider, MD  Glucosamine 500 MG CAPS Take 1 capsule by mouth 3 (three) times daily.   Yes Historical Provider, MD  irbesartan (AVAPRO) 300 MG tablet Take 300 mg by mouth at bedtime.   Yes Historical Provider, MD  levothyroxine (SYNTHROID, LEVOTHROID) 100 MCG tablet Take 100 mcg by mouth daily.   Yes Historical Provider, MD  pioglitazone (ACTOS) 30 MG tablet Take 30 mg by mouth daily.   Yes Historical Provider, MD  sitaGLIPtin (JANUVIA) 100 MG tablet Take 100 mg by mouth daily.   Yes Historical Provider, MD  tamoxifen (  NOLVADEX) 20 MG tablet TAKE 1 TABLET DAILY 08/26/12  Yes Pierce Crane, MD  triamterene-hydrochlorothiazide (MAXZIDE-25) 37.5-25 MG per tablet Take 1 tablet by mouth daily.   Yes Historical Provider, MD  amoxicillin-clavulanate (AUGMENTIN) 875-125 MG per tablet Take 1 tablet by mouth 2 (two) times daily. 12/06/12   Ethelda Chick, MD    Allergies  Allergen Reactions  . Darvon Other (See Comments)    Gets her "high"    History   Social History  . Marital Status: Single    Spouse Name: N/A    Number of Children: N/A  . Years of Education: N/A   Occupational History  . Not on file.   Social History Main Topics  . Smoking status: Former Smoker -- 40 years    Types: Cigarettes  . Smokeless tobacco: Not on file     Comment: stopped smoking 2009  . Alcohol Use: No  . Drug Use: No  .  Sexually Active:    Other Topics Concern  . Not on file   Social History Narrative  . No narrative on file    No family history on file.  Objective:   Physical Exam  Nursing note and vitals reviewed. Constitutional: She is oriented to person, place, and time. She appears well-developed and well-nourished. No distress.  Musculoskeletal:       Right forearm: She exhibits edema and laceration. She exhibits no tenderness and no bony tenderness.       L ARM:  FULL EXTENSION ELBOW; FULL FLEXION ELBOW; FULL ROM L WRIST; NORMAL PRONATION/SUPINATION.  Neurological: She is alert and oriented to person, place, and time.  Skin: She is not diaphoretic.       L FOREARM:  1 CM X 2 MM LINEAR LACERATION; 11CM X 12 CM AREA OF INDURATION, ERYTHEMA, WARMTH.  NO FLUCTUANTS; NON-TENDER.  No streaking.  No axillary L TTP or LAD.  Psychiatric: She has a normal mood and affect. Her behavior is normal.     TDAP ADMINISTERED.     Assessment & Plan:   1. Pain, arm, left    2. Cat bite of forearm  Tdap vaccine greater than or equal to 7yo IM  3. Infected cat bite of forearm  amoxicillin-clavulanate (AUGMENTIN) 875-125 MG per tablet     1. Pain L forearm:  New.  Secondary to cat bite.  Recommend Tylenol PRN. 2.  Catbite of L forearm:  New. S/p TDAP in office. 3.  Cellulitis L forearm secondary to catbite:  New.  Rx for Augmentin 875mg  bid x 10 days; continue with local wound care. RTC immediately for fever, pain, increasing swelling, increasing redness.  Meds ordered this encounter  Medications  . amoxicillin-clavulanate (AUGMENTIN) 875-125 MG per tablet    Sig: Take 1 tablet by mouth 2 (two) times daily.    Dispense:  20 tablet    Refill:  0

## 2012-12-06 NOTE — Patient Instructions (Addendum)
1. Pain, arm, left    2. Cat bite of forearm  Tdap vaccine greater than or equal to 72yo IM  3. Infected cat bite of forearm  amoxicillin-clavulanate (AUGMENTIN) 875-125 MG per tablet     RETURN TO CLINIC IMMEDIATELY FOR DEVELOPMENT OF FEVER, INCREASING SWELLING, INCREASING REDNESS, PAIN AT SITE OF CATBITE.   CLEANSE WOUND DAILY WITH SOAP AND WATER, PEROXIDE; CONTINUE TO APPLY TOPICAL ANTIBIOTIC CREAM TO WOUND. TAKE ALL OF ANTIBIOTICS.

## 2012-12-11 ENCOUNTER — Telehealth: Payer: Self-pay | Admitting: *Deleted

## 2012-12-11 NOTE — Telephone Encounter (Signed)
Received a return call back from patient to reschedule her appt. Confirmed appt. For 01/31/13 at 1pm with Jacquie Hunter,NP.  Then will become Dr. Darnelle Catalan.

## 2012-12-11 NOTE — Telephone Encounter (Signed)
Left message for patient to return call to reschedule patient.

## 2012-12-15 ENCOUNTER — Encounter: Payer: Self-pay | Admitting: Oncology

## 2013-01-31 ENCOUNTER — Encounter: Payer: Self-pay | Admitting: Family

## 2013-01-31 ENCOUNTER — Telehealth: Payer: Self-pay | Admitting: *Deleted

## 2013-01-31 ENCOUNTER — Ambulatory Visit: Payer: Medicare Other | Admitting: Oncology

## 2013-01-31 ENCOUNTER — Telehealth: Payer: Self-pay | Admitting: Family

## 2013-01-31 ENCOUNTER — Ambulatory Visit (HOSPITAL_BASED_OUTPATIENT_CLINIC_OR_DEPARTMENT_OTHER): Payer: Medicare Other | Admitting: Lab

## 2013-01-31 ENCOUNTER — Other Ambulatory Visit: Payer: Medicare Other | Admitting: Lab

## 2013-01-31 ENCOUNTER — Ambulatory Visit (HOSPITAL_BASED_OUTPATIENT_CLINIC_OR_DEPARTMENT_OTHER): Payer: Medicare Other | Admitting: Family

## 2013-01-31 VITALS — BP 151/73 | HR 85 | Temp 98.5°F | Resp 20 | Ht 68.0 in | Wt 259.8 lb

## 2013-01-31 DIAGNOSIS — D631 Anemia in chronic kidney disease: Secondary | ICD-10-CM

## 2013-01-31 DIAGNOSIS — Z853 Personal history of malignant neoplasm of breast: Secondary | ICD-10-CM

## 2013-01-31 DIAGNOSIS — N039 Chronic nephritic syndrome with unspecified morphologic changes: Secondary | ICD-10-CM

## 2013-01-31 DIAGNOSIS — C50919 Malignant neoplasm of unspecified site of unspecified female breast: Secondary | ICD-10-CM | POA: Diagnosis not present

## 2013-01-31 DIAGNOSIS — N289 Disorder of kidney and ureter, unspecified: Secondary | ICD-10-CM | POA: Diagnosis not present

## 2013-01-31 DIAGNOSIS — C50912 Malignant neoplasm of unspecified site of left female breast: Secondary | ICD-10-CM

## 2013-01-31 LAB — CBC WITH DIFFERENTIAL/PLATELET
BASO%: 0.5 % (ref 0.0–2.0)
Basophils Absolute: 0 10*3/uL (ref 0.0–0.1)
EOS%: 2.6 % (ref 0.0–7.0)
HCT: 30.1 % — ABNORMAL LOW (ref 34.8–46.6)
HGB: 10 g/dL — ABNORMAL LOW (ref 11.6–15.9)
MCH: 29 pg (ref 25.1–34.0)
MCHC: 33.3 g/dL (ref 31.5–36.0)
MONO#: 0.6 10*3/uL (ref 0.1–0.9)
NEUT%: 61.5 % (ref 38.4–76.8)
RDW: 14.6 % — ABNORMAL HIGH (ref 11.2–14.5)
WBC: 6.3 10*3/uL (ref 3.9–10.3)
lymph#: 1.6 10*3/uL (ref 0.9–3.3)

## 2013-01-31 LAB — COMPREHENSIVE METABOLIC PANEL (CC13)
ALT: 13 U/L (ref 0–55)
Albumin: 3.2 g/dL — ABNORMAL LOW (ref 3.5–5.0)
CO2: 20 mEq/L — ABNORMAL LOW (ref 22–29)
Glucose: 103 mg/dl — ABNORMAL HIGH (ref 70–99)
Potassium: 4.9 mEq/L (ref 3.5–5.1)
Sodium: 139 mEq/L (ref 136–145)
Total Bilirubin: 0.32 mg/dL (ref 0.20–1.20)
Total Protein: 7 g/dL (ref 6.4–8.3)

## 2013-01-31 LAB — LACTATE DEHYDROGENASE (CC13): LDH: 208 U/L (ref 125–245)

## 2013-01-31 NOTE — Patient Instructions (Addendum)
Please contact us at [phone] if you have any questions or concerns.  Exercise daily, drink plenty of water, eat a healthy diet, get plenty of rest.

## 2013-01-31 NOTE — Progress Notes (Signed)
[facility]  [phone] [phone]  OFFICE PROGRESS NOTE   ID: Autumn Chambers   DOB: September 24, 1941  MR#: [id number]  JYN#:[id number]   PCP: Levonne Lapping, NP-C GYN: Timothy P.  Audie Box, MD SU: Mardene Celeste.  Lurene Shadow MD OTHER MD:  Duke Salvia.  Eliott Nine, MD   HISTORY OF PRESENT ILLNESS: From Dr. Theron Arista Rubin's New Patient Evaluation Note dated 07/09/2008:  "This is a delightful 72 year old woman from [facility] referred by Dr. Lurene Shadow for evaluation and treatment of breast cancer.   This woman has been in fairly good health.  She has undergone annual screening mammography.  She had a follow-up mammogram after a mammogram in December 2008 on 04/17/2008, which at the time showed an architectural distortion at 10 o'clock position, left breast.  At the time, there was a palpable grape-sized lump 10 o'clock position 7 cm from the nipple.  Ultrasound of the area showed a hypoechoic area measuring 1.4 x 1.2 x 1.3 cm.  An associated BSGI scan that was done on 04/29/2008 showed abnormal activity in the left breast in a similar position measuring an area of 1.6 cm.  A biopsy was recommended, which took place on 04/29/2008.  This showed an intermediate to high-grade ductal cancer.  Definitive lymphovascular invasion was not identified.  Prognostic panel indicated that this was ER and PR positive at 83% and 99% respectively.  Proliferative index 15%, HER-2 was 1+.  Patient elected to undergo a lumpectomy and sentinel lymph node evaluation on 05/29/2008.  Final pathology showed a 1.3 cm grade 2/3 invasive lobular cancer.  Lymphovascular invasion was present.  Margins were negative.  Two sentinel lymph nodes were negative for tumor.  The patient has had an unremarkable postoperative course."  Her subsequent history is as detailed below.   INTERVAL HISTORY: Dr. Darnelle Catalan and I saw [name] today for follow up of invasive lobular carcinoma of the left breast.  She was last seen by Dr.  Donnie Coffin on 07/31/2012.  Since her last office visit the patient states that she's been doing very well.  She is establishing herself with Dr. Darrall Dears service today.   REVIEW OF SYSTEMS: A 10 point review of systems was completed and is negative except.  The patient denies any symptomatology.   PAST MEDICAL HISTORY: Past Medical History  Diagnosis Date  . PONV (postoperative nausea and vomiting)   . Hypertension   . Hyperlipidemia   . Shortness of breath     "mild upon exertion"  . Diabetes mellitus     oral  . Hypothyroidism   . Anemia   . Urinary tract infection     hx of  . H/O hiatal hernia   . Irritable bowel   . Cancer     hx of skin ca  . Arthritis     PAST SURGICAL HISTORY: Past Surgical History  Procedure Laterality Date  . Skin cancer excision      approx 4 years ago  . Achilles tendon surgery    . Eye surgery      bilateral cataract surgery  . Tonsillectomy    . Dilation and curettage of uterus    . Breast lumpectomy      left, with 2 nodes removed  . Total knee arthroplasty  01/04/2012    Procedure: TOTAL KNEE ARTHROPLASTY;  Surgeon: Nadara Mustard, MD;  Location: [facility] OR;  Service: Orthopedics;  Laterality: Right;  Right Total Knee Arthroplasty    FAMILY HISTORY Family History  Problem Relation  Age of Onset  . Cancer Father     Prostate  . Cancer Sister     Pancreatic cancer  . Cancer Brother     Brain cancer  . Hypertension Brother     GYNECOLOGIC HISTORY: The patient is a G0, P0 female.  Age 86 at menarche, age 88 at menopause.  The patient was on hormone replacement for six years.    SOCIAL HISTORY: She is single.  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  She is a lifelong resident of [place], [place].  She enjoys playing with her cats and doing yard work in her spare time.   ADVANCED DIRECTIVES: Not on file  HEALTH MAINTENANCE: History  Substance Use Topics  . Smoking  status: Former Smoker -- 40 years    Types: Cigarettes  . Smokeless tobacco: Never Used     Comment: stopped smoking 2009  . Alcohol Use: No   Colonoscopy: Not on file PAP: Not on file Bone density: Not on file Lipid panel: Not on file  Allergies  Allergen Reactions  . Darvon Other (See Comments)    Gets her "high"    Current Outpatient Prescriptions  Medication Sig Dispense Refill  . alendronate (FOSAMAX) 70 MG tablet Take 70 mg by mouth every 7 (seven) days. Take with a full glass of water on an empty stomach.      Marland Kitchen CALCIUM PO Take 1,500 mg by mouth 2 (two) times daily.      . celecoxib (CELEBREX) 200 MG capsule Take 200 mg by mouth daily.      . cholecalciferol (VITAMIN D) 1000 UNITS tablet Take 1,000 Units by mouth daily.      . diazepam (VALIUM) 2 MG tablet Take 2 mg by mouth daily.      . diphenhydramine-acetaminophen (TYLENOL PM) 25-500 MG TABS Take 1 tablet by mouth at bedtime as needed. For sleep      . esomeprazole (NEXIUM) 10 MG packet Take 10 mg by mouth daily before breakfast.      . Glucosamine 500 MG CAPS Take 1 capsule by mouth 3 (three) times daily.      . irbesartan (AVAPRO) 300 MG tablet Take 300 mg by mouth at bedtime.      Marland Kitchen levothyroxine (SYNTHROID, LEVOTHROID) 100 MCG tablet Take 100 mcg by mouth daily.      . pioglitazone (ACTOS) 30 MG tablet Take 30 mg by mouth daily.      . sitaGLIPtin (JANUVIA) 100 MG tablet Take 100 mg by mouth daily.      . tamoxifen (NOLVADEX) 20 MG tablet TAKE 1 TABLET DAILY  90 tablet  1  . triamterene-hydrochlorothiazide (MAXZIDE-25) 37.5-25 MG per tablet Take 1 tablet by mouth daily.       No current facility-administered medications for this visit.    OBJECTIVE: Filed Vitals:   01/31/13 1259  BP: 151/73  Pulse: 85  Temp: 98.5 F (36.9 C)  Resp: 20     Body mass index is 39.51 kg/(m^2).      ECOG FS:  Grade 0 - Fully active  General appearance: Alert, cooperative, well nourished, no apparent distress Head:  Normocephalic, without obvious abnormality, atraumatic Eyes: Conjunctivae/corneas clear, PERRLA, EOMI Ears: Hard of hearing bilaterally Nose: Nares, septum and mucosa are normal, no drainage or sinus tenderness Neck: No adenopathy, supple, excessive habitus, symmetrical, trachea midline, no tenderness Resp: Clear to auscultation bilaterally Cardio: Regular rate and rhythm, S1, S2  normal, no murmur, click, rub or gallop Breasts: Pendulous bilaterally, left breast well-healed surgical scar, left breast confirmed medial mammary ridge, left breast radiation changes noted, bilateral breast tissue is fibrous/glandular, no lymphadenopathy, no nipple inversion, bilateral axillary fullness GI:  Soft, distended, excessive habitus non-tender, hypoactive bowel sounds Extremities: Extremities normal, atraumatic, no cyanosis or edema Lymph nodes: Cervical, supraclavicular, and axillary nodes normal Neurologic: Grossly normal   LAB RESULTS: Lab Results  Component Value Date   WBC 6.3 01/31/2013   NEUTROABS 3.9 01/31/2013   HGB 10.0* 01/31/2013   HCT 30.1* 01/31/2013   MCV 87.2 01/31/2013   PLT 203 01/31/2013      Chemistry      Component Value Date/Time   NA 139 01/31/2013 1429   NA 135 01/07/2012 0610   K 4.9 01/31/2013 1429   K 4.8 01/07/2012 0610   CL 112* 01/31/2013 1429   CL 106 01/07/2012 0610   CO2 20* 01/31/2013 1429   CO2 21 01/07/2012 0610   BUN 28.7* 01/31/2013 1429   BUN 31* 01/07/2012 0610   CREATININE 1.5* 01/31/2013 1429   CREATININE 1.52* 01/07/2012 0610      Component Value Date/Time   CALCIUM 9.4 01/31/2013 1429   CALCIUM 8.3* 01/07/2012 0610   ALKPHOS 59 01/31/2013 1429   ALKPHOS 64 12/21/2011 1345   AST 18 01/31/2013 1429   AST 17 12/21/2011 1345   ALT 13 01/31/2013 1429   ALT 13 12/21/2011 1345   BILITOT 0.32 01/31/2013 1429   BILITOT 0.3 12/21/2011 1345      Lab Results  Component Value Date   LABCA2 46* 07/31/2012    Urinalysis    Component Value Date/Time   COLORURINE YELLOW 05/26/2008 1453    APPEARANCEUR CLOUDY* 05/26/2008 1453   LABSPEC 1.017 05/26/2008 1453   PHURINE 6.0 05/26/2008 1453   GLUCOSEU NEGATIVE 05/26/2008 1453   HGBUR NEGATIVE 05/26/2008 1453   BILIRUBINUR NEGATIVE 05/26/2008 1453   KETONESUR NEGATIVE 05/26/2008 1453   PROTEINUR NEGATIVE 05/26/2008 1453   UROBILINOGEN 0.2 05/26/2008 1453   NITRITE NEGATIVE 05/26/2008 1453   LEUKOCYTESUR LARGE* 05/26/2008 1453    STUDIES: No results found.  ASSESSMENT: 72 y.o. [place]:  1.  Status post left breast lumpectomy on 05/29/2008 for a stage IA, T1c N0 invasive lobular carcinoma and lymphovascular invasion with axillary sentinel node biopsy, 1.3 cm, grade 2, ER 83%, PR 29%, Ki-67 15%, HER-2/neu negative, 0/2 positive lymph nodes.  2.  Status post left breast radiation therapy from 07/21/2008 through 09/03/2008.  3.  The patient started antiestrogen therapy with Tamoxifen in 08/2008.  4.  Anemia of chronic disease secondary to renal insufficiency.   PLAN: 1.  The patient is scheduled to continue antiestrogen therapy with Tamoxifen 20 mg by mouth daily until 08/2013.  An electronic prescription for Tamoxifen #90 with one refill was sent to the patient's pharmacy.  The patient states she has a mammogram already scheduled for 02/2013 at [facility].  2.  The patient will be referred to Nephrologist, Camille Bal for consultation regarding renal insufficiency.  The patient's anemia of chronic disease does not warrant further action at this time.  3.  We plan to see the patient again in 08/2013 at which time the patient will be over 5 years out from her original diagnosis.  A decision will be made at that time for the patient to continue annual visits here at the breast cancer center or if she will be a graduate of the [facility]  breast cancer program.  All questions were answered.  The patient was encouraged to contact us in the interim with any problems, questions or concerns.   Larina Bras,  NP-C 02/03/2013, 3:58 PM

## 2013-01-31 NOTE — Telephone Encounter (Signed)
appts made and printed 

## 2013-01-31 NOTE — Telephone Encounter (Signed)
Left message for Autumn Chambers to call me back.  I wanted to speak to the patient about chronic anemia due to renal insufficiency.  I will be making a referral to Nephrologist Dr. Camille Bal if ok with the patient.

## 2013-02-04 ENCOUNTER — Other Ambulatory Visit: Payer: Self-pay | Admitting: *Deleted

## 2013-02-04 ENCOUNTER — Telehealth: Payer: Self-pay | Admitting: *Deleted

## 2013-02-04 NOTE — Telephone Encounter (Signed)
This RN spoke with pt per request of Annice Pih NP per need for evaluation of anemia with kidney insuffiency.  This RN reviewed pt's labs and discussed reason for referral. Per discussion pt verbalized understanding but did request for appointment not to be made for this week " due to full schedule ".  This RN called to Dr Elza Rafter office at [facility] and was requested to fax request to them for appointment.  Records obtained and faxed per above.

## 2013-02-07 ENCOUNTER — Telehealth: Payer: Self-pay | Admitting: *Deleted

## 2013-02-07 NOTE — Telephone Encounter (Signed)
Message received from [place] at [facility] stating pt's records were received and reviewed by MD. Per review pt does need follow up urgently and will be scheduled 6-8 weeks.  This RN called and spoke with pt per above who stated understanding as well as gladness to hear her situation is not urgent.

## 2013-02-25 ENCOUNTER — Other Ambulatory Visit: Payer: Self-pay | Admitting: Oncology

## 2013-02-25 DIAGNOSIS — C50919 Malignant neoplasm of unspecified site of unspecified female breast: Secondary | ICD-10-CM

## 2013-02-28 DIAGNOSIS — M810 Age-related osteoporosis without current pathological fracture: Secondary | ICD-10-CM

## 2013-02-28 HISTORY — DX: Age-related osteoporosis without current pathological fracture: M81.0

## 2013-03-13 DIAGNOSIS — M810 Age-related osteoporosis without current pathological fracture: Secondary | ICD-10-CM | POA: Diagnosis not present

## 2013-03-13 DIAGNOSIS — Z853 Personal history of malignant neoplasm of breast: Secondary | ICD-10-CM | POA: Diagnosis not present

## 2013-03-15 ENCOUNTER — Encounter: Payer: Self-pay | Admitting: Gynecology

## 2013-04-03 ENCOUNTER — Encounter: Payer: Self-pay | Admitting: Oncology

## 2013-04-17 DIAGNOSIS — R404 Transient alteration of awareness: Secondary | ICD-10-CM | POA: Diagnosis not present

## 2013-04-17 DIAGNOSIS — E039 Hypothyroidism, unspecified: Secondary | ICD-10-CM | POA: Diagnosis not present

## 2013-04-17 DIAGNOSIS — K219 Gastro-esophageal reflux disease without esophagitis: Secondary | ICD-10-CM | POA: Diagnosis not present

## 2013-04-17 DIAGNOSIS — I10 Essential (primary) hypertension: Secondary | ICD-10-CM | POA: Diagnosis not present

## 2013-04-17 DIAGNOSIS — E1129 Type 2 diabetes mellitus with other diabetic kidney complication: Secondary | ICD-10-CM | POA: Diagnosis not present

## 2013-04-17 DIAGNOSIS — R350 Frequency of micturition: Secondary | ICD-10-CM | POA: Diagnosis not present

## 2013-04-17 DIAGNOSIS — D649 Anemia, unspecified: Secondary | ICD-10-CM | POA: Diagnosis not present

## 2013-04-24 DIAGNOSIS — G4710 Hypersomnia, unspecified: Secondary | ICD-10-CM | POA: Diagnosis not present

## 2013-04-24 DIAGNOSIS — R069 Unspecified abnormalities of breathing: Secondary | ICD-10-CM | POA: Diagnosis not present

## 2013-04-24 DIAGNOSIS — R0681 Apnea, not elsewhere classified: Secondary | ICD-10-CM | POA: Diagnosis not present

## 2013-05-31 DIAGNOSIS — D649 Anemia, unspecified: Secondary | ICD-10-CM | POA: Diagnosis not present

## 2013-05-31 DIAGNOSIS — I10 Essential (primary) hypertension: Secondary | ICD-10-CM | POA: Diagnosis not present

## 2013-05-31 DIAGNOSIS — I129 Hypertensive chronic kidney disease with stage 1 through stage 4 chronic kidney disease, or unspecified chronic kidney disease: Secondary | ICD-10-CM | POA: Diagnosis not present

## 2013-05-31 DIAGNOSIS — N2581 Secondary hyperparathyroidism of renal origin: Secondary | ICD-10-CM | POA: Diagnosis not present

## 2013-07-17 DIAGNOSIS — M1710 Unilateral primary osteoarthritis, unspecified knee: Secondary | ICD-10-CM | POA: Diagnosis not present

## 2013-07-22 ENCOUNTER — Other Ambulatory Visit (HOSPITAL_COMMUNITY): Payer: Self-pay | Admitting: Orthopedic Surgery

## 2013-07-23 ENCOUNTER — Encounter (HOSPITAL_COMMUNITY)
Admission: RE | Admit: 2013-07-23 | Discharge: 2013-07-23 | Disposition: A | Discharge status: Home / Self Care | Payer: Medicare Other | Source: Ambulatory Visit | Attending: Orthopedic Surgery | Admitting: Orthopedic Surgery

## 2013-07-23 ENCOUNTER — Encounter (HOSPITAL_COMMUNITY): Payer: Self-pay

## 2013-07-23 ENCOUNTER — Ambulatory Visit (HOSPITAL_COMMUNITY)
Admission: RE | Admit: 2013-07-23 | Discharge: 2013-07-23 | Disposition: A | Discharge status: Home / Self Care | Payer: Medicare Other | Source: Ambulatory Visit | Attending: Orthopedic Surgery | Admitting: Orthopedic Surgery

## 2013-07-23 ENCOUNTER — Other Ambulatory Visit (HOSPITAL_COMMUNITY): Payer: Medicare Other

## 2013-07-23 DIAGNOSIS — Z01812 Encounter for preprocedural laboratory examination: Secondary | ICD-10-CM | POA: Diagnosis not present

## 2013-07-23 DIAGNOSIS — Z01818 Encounter for other preprocedural examination: Secondary | ICD-10-CM | POA: Diagnosis not present

## 2013-07-23 DIAGNOSIS — Z01810 Encounter for preprocedural cardiovascular examination: Secondary | ICD-10-CM | POA: Diagnosis not present

## 2013-07-23 HISTORY — DX: Chronic kidney disease, unspecified: N18.9

## 2013-07-23 HISTORY — DX: Unspecified hearing loss, unspecified ear: H91.90

## 2013-07-23 LAB — COMPREHENSIVE METABOLIC PANEL
Albumin: 3.7 g/dL (ref 3.5–5.2)
BUN: 26 mg/dL — ABNORMAL HIGH (ref 6–23)
Calcium: 9.5 mg/dL (ref 8.4–10.5)
Creatinine, Ser: 1.53 mg/dL — ABNORMAL HIGH (ref 0.50–1.10)
GFR calc non Af Amer: 33 mL/min — ABNORMAL LOW (ref >90)
Total Protein: 7.3 g/dL (ref 6.0–8.3)

## 2013-07-23 LAB — CBC
HCT: 32.9 % — ABNORMAL LOW (ref 36.0–46.0)
MCH: 30.3 pg (ref 26.0–34.0)
MCHC: 33.1 g/dL (ref 30.0–36.0)
MCV: 91.4 fL (ref 78.0–100.0)
RDW: 15.3 % (ref 11.5–15.5)

## 2013-07-23 LAB — TYPE AND SCREEN: Antibody Screen: NEGATIVE

## 2013-07-23 LAB — PROTIME-INR
INR: 1.05 (ref 0.00–1.49)
Prothrombin Time: 13.5 seconds (ref 11.6–15.2)

## 2013-07-23 LAB — SURGICAL PCR SCREEN: Staphylococcus aureus: NEGATIVE

## 2013-07-23 LAB — APTT: aPTT: 29 seconds (ref 24–37)

## 2013-07-23 NOTE — Pre-Procedure Instructions (Signed)
Autumn Chambers  07/23/2013   Your procedure is scheduled on:  Wednesday, October 1st.  Report to [facility], [place] Entrance [name] "A" at 6:30 AM.  Call this number if you have problems the morning of surgery: [phone]   Remember:   Do not eat food or drink liquids after midnight.   Take these medicines the morning of surgery with A SIP OF WATER:esomeprazole (NEXIUM), levothyroxine (SYNTHROID, LEVOTHROID), tamoxifen (NOLVADEX), esomeprazole (NEXIUM).  Stop taking Aspirin, Coumadin, Plavix, Effient and Herbal medications.  Don not take any NSAIDs ie: Ibuprofen,  Advil,Naproxen or any medication containing Aspirin.     Do not wear jewelry, make-up or nail polish.  Do not wear lotions, powders, or perfumes. You may wear deodorant.  Do not shave 48 hours prior to surgery.   Do not bring valuables to the hospital.  [facility] is not responsible  for any belongings or valuables.  Contacts, dentures or bridgework may not be worn into surgery.  Leave suitcase in the car. After surgery it may be brought to your room.  For patients admitted to the hospital, checkout time is 11:00 AM the day of discharge.    Special Instructions: Shower using CHG 2 nights before surgery and the night before surgery.  If you shower the day of surgery use CHG.  Use special wash - you have one bottle of CHG for all showers.  You should use approximately 1/3 of the bottle for each shower.   Please read over the following fact sheets that you were given: Pain Booklet, Coughing and Deep Breathing, Blood Transfusion Information and Surgical Site Infection Prevention

## 2013-07-23 NOTE — Pre-Procedure Instructions (Signed)
Autumn Chambers  07/23/2013   Your procedure is scheduled on:  Wednesdya, October 1st.  Report to [facility], Main Entrance [name] "A" at 6:30 AM.  Call this number if you have problems the morning of surgery: [phone]   Remember:   Do not eat food or drink liquids after midnight.   Take these medicines the morning of surgery with A SIP OF WATER:esomeprazole (NEXIUM), levothyroxine (SYNTHROID, LEVOTHROID), tamoxifen (NOLVADEX), esomeprazole (NEXIUM).  Stop taking Aspirin, Coumadin, Plavix, Effient and Herbal medications.  Don not take any NSAIDs ie: Ibuprofen,  Advil,Naproxen or any medication containing Aspirin.     Do not wear jewelry, make-up or nail polish.  Do not wear lotions, powders, or perfumes. You may wear deodorant.  Do not shave 48 hours prior to surgery.   Do not bring valuables to the hospital.  [facility] is not responsible  for any belongings or valuables.  Contacts, dentures or bridgework may not be worn into surgery.  Leave suitcase in the car. After surgery it may be brought to your room.  For patients admitted to the hospital, checkout time is 11:00 AM the day of discharge.    Special Instructions: Shower using CHG 2 nights before surgery and the night before surgery.  If you shower the day of surgery use CHG.  Use special wash - you have one bottle of CHG for all showers.  You should use approximately 1/3 of the bottle for each shower.   Please read over the following fact sheets that you were given: Pain Booklet, Coughing and Deep Breathing, Blood Transfusion Information and Surgical Site Infection Prevention

## 2013-07-24 DIAGNOSIS — M810 Age-related osteoporosis without current pathological fracture: Secondary | ICD-10-CM | POA: Diagnosis not present

## 2013-07-24 DIAGNOSIS — E039 Hypothyroidism, unspecified: Secondary | ICD-10-CM | POA: Diagnosis not present

## 2013-07-24 DIAGNOSIS — Z853 Personal history of malignant neoplasm of breast: Secondary | ICD-10-CM | POA: Diagnosis not present

## 2013-07-24 DIAGNOSIS — M1710 Unilateral primary osteoarthritis, unspecified knee: Secondary | ICD-10-CM | POA: Diagnosis not present

## 2013-07-24 DIAGNOSIS — E1129 Type 2 diabetes mellitus with other diabetic kidney complication: Secondary | ICD-10-CM | POA: Diagnosis not present

## 2013-07-24 DIAGNOSIS — I10 Essential (primary) hypertension: Secondary | ICD-10-CM | POA: Diagnosis not present

## 2013-07-24 NOTE — Progress Notes (Addendum)
I have requested stress test from [facility].  (not sure if [facility] was correct office, no notes)...DA [id number]  Notes coming from proficient now and placed on chart.-- DA

## 2013-07-30 MED ORDER — CEFAZOLIN SODIUM-DEXTROSE 2-3 GM-% IV SOLR
2.0000 g | INTRAVENOUS | Status: AC
  Administered 2013-07-31: 2 g via INTRAVENOUS
  Filled 2013-07-30: qty 50

## 2013-07-31 ENCOUNTER — Encounter (HOSPITAL_COMMUNITY)
Admission: RE | Disposition: A | Discharge status: Skilled Nursing Facility | Payer: Self-pay | Source: Ambulatory Visit | Attending: Orthopedic Surgery

## 2013-07-31 ENCOUNTER — Inpatient Hospital Stay (HOSPITAL_COMMUNITY): Payer: Medicare Other | Admitting: Anesthesiology

## 2013-07-31 ENCOUNTER — Inpatient Hospital Stay (HOSPITAL_COMMUNITY)
Admission: RE | Admit: 2013-07-31 | Discharge: 2013-08-03 | DRG: 470 | Disposition: A | Discharge status: Skilled Nursing Facility | Payer: Medicare Other | Source: Ambulatory Visit | Attending: Orthopedic Surgery | Admitting: Orthopedic Surgery

## 2013-07-31 ENCOUNTER — Encounter (HOSPITAL_COMMUNITY): Payer: Self-pay | Admitting: *Deleted

## 2013-07-31 ENCOUNTER — Encounter (HOSPITAL_COMMUNITY): Payer: Self-pay | Admitting: Anesthesiology

## 2013-07-31 ENCOUNTER — Inpatient Hospital Stay (HOSPITAL_COMMUNITY): Payer: Medicare Other

## 2013-07-31 DIAGNOSIS — Z85828 Personal history of other malignant neoplasm of skin: Secondary | ICD-10-CM

## 2013-07-31 DIAGNOSIS — I10 Essential (primary) hypertension: Secondary | ICD-10-CM | POA: Diagnosis not present

## 2013-07-31 DIAGNOSIS — E119 Type 2 diabetes mellitus without complications: Secondary | ICD-10-CM | POA: Diagnosis present

## 2013-07-31 DIAGNOSIS — G4730 Sleep apnea, unspecified: Secondary | ICD-10-CM | POA: Diagnosis present

## 2013-07-31 DIAGNOSIS — E785 Hyperlipidemia, unspecified: Secondary | ICD-10-CM | POA: Diagnosis present

## 2013-07-31 DIAGNOSIS — F411 Generalized anxiety disorder: Secondary | ICD-10-CM | POA: Diagnosis not present

## 2013-07-31 DIAGNOSIS — I129 Hypertensive chronic kidney disease with stage 1 through stage 4 chronic kidney disease, or unspecified chronic kidney disease: Secondary | ICD-10-CM | POA: Diagnosis present

## 2013-07-31 DIAGNOSIS — Z87891 Personal history of nicotine dependence: Secondary | ICD-10-CM | POA: Diagnosis not present

## 2013-07-31 DIAGNOSIS — N189 Chronic kidney disease, unspecified: Secondary | ICD-10-CM | POA: Diagnosis present

## 2013-07-31 DIAGNOSIS — E039 Hypothyroidism, unspecified: Secondary | ICD-10-CM | POA: Diagnosis not present

## 2013-07-31 DIAGNOSIS — M810 Age-related osteoporosis without current pathological fracture: Secondary | ICD-10-CM | POA: Diagnosis present

## 2013-07-31 DIAGNOSIS — S8990XA Unspecified injury of unspecified lower leg, initial encounter: Secondary | ICD-10-CM | POA: Diagnosis not present

## 2013-07-31 DIAGNOSIS — M1990 Unspecified osteoarthritis, unspecified site: Secondary | ICD-10-CM | POA: Diagnosis not present

## 2013-07-31 DIAGNOSIS — Z853 Personal history of malignant neoplasm of breast: Secondary | ICD-10-CM | POA: Diagnosis not present

## 2013-07-31 DIAGNOSIS — M25569 Pain in unspecified knee: Secondary | ICD-10-CM | POA: Diagnosis not present

## 2013-07-31 DIAGNOSIS — R269 Unspecified abnormalities of gait and mobility: Secondary | ICD-10-CM | POA: Diagnosis not present

## 2013-07-31 DIAGNOSIS — K2100 Gastro-esophageal reflux disease with esophagitis, without bleeding: Secondary | ICD-10-CM | POA: Diagnosis not present

## 2013-07-31 DIAGNOSIS — H9190 Unspecified hearing loss, unspecified ear: Secondary | ICD-10-CM | POA: Diagnosis not present

## 2013-07-31 DIAGNOSIS — Z23 Encounter for immunization: Secondary | ICD-10-CM | POA: Diagnosis not present

## 2013-07-31 DIAGNOSIS — Z79899 Other long term (current) drug therapy: Secondary | ICD-10-CM

## 2013-07-31 DIAGNOSIS — R279 Unspecified lack of coordination: Secondary | ICD-10-CM | POA: Diagnosis not present

## 2013-07-31 DIAGNOSIS — Z471 Aftercare following joint replacement surgery: Secondary | ICD-10-CM | POA: Diagnosis not present

## 2013-07-31 DIAGNOSIS — E1149 Type 2 diabetes mellitus with other diabetic neurological complication: Secondary | ICD-10-CM | POA: Diagnosis not present

## 2013-07-31 DIAGNOSIS — M1712 Unilateral primary osteoarthritis, left knee: Secondary | ICD-10-CM | POA: Diagnosis present

## 2013-07-31 DIAGNOSIS — G8918 Other acute postprocedural pain: Secondary | ICD-10-CM | POA: Diagnosis not present

## 2013-07-31 DIAGNOSIS — M1710 Unilateral primary osteoarthritis, unspecified knee: Secondary | ICD-10-CM | POA: Diagnosis not present

## 2013-07-31 DIAGNOSIS — Z96659 Presence of unspecified artificial knee joint: Secondary | ICD-10-CM | POA: Diagnosis not present

## 2013-07-31 DIAGNOSIS — M6281 Muscle weakness (generalized): Secondary | ICD-10-CM | POA: Diagnosis not present

## 2013-07-31 DIAGNOSIS — M818 Other osteoporosis without current pathological fracture: Secondary | ICD-10-CM | POA: Diagnosis not present

## 2013-07-31 HISTORY — PX: TOTAL KNEE ARTHROPLASTY: SHX125

## 2013-07-31 SURGERY — ARTHROPLASTY, KNEE, TOTAL
Anesthesia: Regional | Site: Knee | Laterality: Left | Wound class: Clean

## 2013-07-31 MED ORDER — HYDROMORPHONE HCL PF 1 MG/ML IJ SOLN
0.2500 mg | INTRAMUSCULAR | Status: DC | PRN
  Administered 2013-07-31: 0.25 mg via INTRAVENOUS
  Administered 2013-07-31: 0.5 mg via INTRAVENOUS
  Administered 2013-07-31: 0.25 mg via INTRAVENOUS

## 2013-07-31 MED ORDER — DIPHENHYDRAMINE HCL 12.5 MG/5ML PO ELIX
12.5000 mg | ORAL_SOLUTION | ORAL | Status: DC | PRN
  Filled 2013-07-31: qty 10

## 2013-07-31 MED ORDER — METOCLOPRAMIDE HCL 5 MG PO TABS
5.0000 mg | ORAL_TABLET | Freq: Three times a day (TID) | ORAL | Status: DC | PRN
  Filled 2013-07-31: qty 2

## 2013-07-31 MED ORDER — OXYCODONE HCL 5 MG PO TABS
5.0000 mg | ORAL_TABLET | ORAL | Status: DC | PRN
  Administered 2013-08-01 (×2): 5 mg via ORAL
  Administered 2013-08-02 – 2013-08-03 (×5): 10 mg via ORAL
  Filled 2013-07-31: qty 2
  Filled 2013-07-31: qty 1
  Filled 2013-07-31: qty 2
  Filled 2013-07-31: qty 1
  Filled 2013-07-31 (×3): qty 2

## 2013-07-31 MED ORDER — BUPIVACAINE LIPOSOME 1.3 % IJ SUSP
20.0000 mL | Freq: Once | INTRAMUSCULAR | Status: DC
  Filled 2013-07-31: qty 20

## 2013-07-31 MED ORDER — PROPOFOL 10 MG/ML IV BOLUS
INTRAVENOUS | Status: DC | PRN
  Administered 2013-07-31: 150 mg via INTRAVENOUS

## 2013-07-31 MED ORDER — ROPIVACAINE HCL 5 MG/ML IJ SOLN
INTRAMUSCULAR | Status: DC | PRN
  Administered 2013-07-31: 30 mL via EPIDURAL

## 2013-07-31 MED ORDER — ASPIRIN EC 325 MG PO TBEC
325.0000 mg | DELAYED_RELEASE_TABLET | Freq: Every day | ORAL | Status: DC
  Administered 2013-08-01 – 2013-08-03 (×3): 325 mg via ORAL
  Filled 2013-07-31 (×4): qty 1

## 2013-07-31 MED ORDER — INSULIN ASPART 100 UNIT/ML ~~LOC~~ SOLN
0.0000 [IU] | Freq: Three times a day (TID) | SUBCUTANEOUS | Status: DC
  Administered 2013-08-01 – 2013-08-02 (×3): 3 [IU] via SUBCUTANEOUS

## 2013-07-31 MED ORDER — MIDAZOLAM HCL 5 MG/5ML IJ SOLN
INTRAMUSCULAR | Status: DC | PRN
  Administered 2013-07-31 (×2): 1 mg via INTRAVENOUS

## 2013-07-31 MED ORDER — SODIUM CHLORIDE 0.9 % IJ SOLN
INTRAMUSCULAR | Status: DC | PRN
  Administered 2013-07-31: 10:00:00

## 2013-07-31 MED ORDER — METHOCARBAMOL 500 MG PO TABS
500.0000 mg | ORAL_TABLET | Freq: Four times a day (QID) | ORAL | Status: DC | PRN
  Administered 2013-08-01 – 2013-08-02 (×2): 500 mg via ORAL
  Filled 2013-07-31 (×3): qty 1

## 2013-07-31 MED ORDER — ONDANSETRON HCL 4 MG/2ML IJ SOLN
INTRAMUSCULAR | Status: DC | PRN
  Administered 2013-07-31: 4 mg via INTRAVENOUS

## 2013-07-31 MED ORDER — LIDOCAINE HCL (CARDIAC) 20 MG/ML IV SOLN
INTRAVENOUS | Status: DC | PRN
  Administered 2013-07-31: 30 mg via INTRAVENOUS

## 2013-07-31 MED ORDER — ONDANSETRON HCL 4 MG PO TABS: 4.0000 mg | ORAL_TABLET | Freq: Four times a day (QID) | ORAL | Status: DC | PRN

## 2013-07-31 MED ORDER — MAGNESIUM CITRATE PO SOLN: 1.0000 | Freq: Once | ORAL | Status: AC | PRN

## 2013-07-31 MED ORDER — CEFAZOLIN SODIUM-DEXTROSE 2-3 GM-% IV SOLR
2.0000 g | Freq: Four times a day (QID) | INTRAVENOUS | Status: AC
  Administered 2013-07-31 (×2): 2 g via INTRAVENOUS
  Filled 2013-07-31 (×2): qty 50

## 2013-07-31 MED ORDER — ACETAMINOPHEN 650 MG RE SUPP: 650.0000 mg | Freq: Four times a day (QID) | RECTAL | Status: DC | PRN

## 2013-07-31 MED ORDER — SODIUM CHLORIDE 0.9 % IR SOLN
Status: DC | PRN
  Administered 2013-07-31: 3000 mL

## 2013-07-31 MED ORDER — DOCUSATE SODIUM 100 MG PO CAPS
100.0000 mg | ORAL_CAPSULE | Freq: Two times a day (BID) | ORAL | Status: DC
  Administered 2013-07-31 – 2013-08-02 (×4): 100 mg via ORAL
  Filled 2013-07-31 (×7): qty 1

## 2013-07-31 MED ORDER — INSULIN ASPART 100 UNIT/ML ~~LOC~~ SOLN
6.0000 [IU] | Freq: Three times a day (TID) | SUBCUTANEOUS | Status: DC
  Administered 2013-08-01 – 2013-08-02 (×5): 6 [IU] via SUBCUTANEOUS

## 2013-07-31 MED ORDER — ONDANSETRON HCL 4 MG/2ML IJ SOLN: 4.0000 mg | Freq: Four times a day (QID) | INTRAMUSCULAR | Status: DC | PRN

## 2013-07-31 MED ORDER — MENTHOL 3 MG MT LOZG: 1.0000 | LOZENGE | OROMUCOSAL | Status: DC | PRN

## 2013-07-31 MED ORDER — PHENOL 1.4 % MT LIQD: 1.0000 | OROMUCOSAL | Status: DC | PRN

## 2013-07-31 MED ORDER — CHLORHEXIDINE GLUCONATE 4 % EX LIQD: 60.0000 mL | Freq: Once | CUTANEOUS | Status: DC

## 2013-07-31 MED ORDER — SODIUM CHLORIDE 0.9 % IV SOLN
INTRAVENOUS | Status: DC
  Administered 2013-07-31: 20 mL/h via INTRAVENOUS

## 2013-07-31 MED ORDER — OXYCODONE HCL 5 MG PO TABS: 5.0000 mg | ORAL_TABLET | Freq: Once | ORAL | Status: DC | PRN

## 2013-07-31 MED ORDER — METOCLOPRAMIDE HCL 5 MG/ML IJ SOLN
5.0000 mg | Freq: Three times a day (TID) | INTRAMUSCULAR | Status: DC | PRN
  Filled 2013-07-31: qty 2

## 2013-07-31 MED ORDER — LACTATED RINGERS IV SOLN
INTRAVENOUS | Status: DC | PRN
  Administered 2013-07-31 (×2): via INTRAVENOUS

## 2013-07-31 MED ORDER — BISACODYL 5 MG PO TBEC: 5.0000 mg | DELAYED_RELEASE_TABLET | Freq: Every day | ORAL | Status: DC | PRN

## 2013-07-31 MED ORDER — ONDANSETRON HCL 4 MG/2ML IJ SOLN
INTRAMUSCULAR | Status: AC
  Filled 2013-07-31: qty 2

## 2013-07-31 MED ORDER — SENNOSIDES-DOCUSATE SODIUM 8.6-50 MG PO TABS
1.0000 | ORAL_TABLET | Freq: Every evening | ORAL | Status: DC | PRN
  Filled 2013-07-31: qty 1

## 2013-07-31 MED ORDER — ACETAMINOPHEN 325 MG PO TABS: 650.0000 mg | ORAL_TABLET | Freq: Four times a day (QID) | ORAL | Status: DC | PRN

## 2013-07-31 MED ORDER — HYDROMORPHONE HCL PF 1 MG/ML IJ SOLN
INTRAMUSCULAR | Status: AC
  Filled 2013-07-31: qty 1

## 2013-07-31 MED ORDER — HYDROMORPHONE HCL PF 1 MG/ML IJ SOLN
1.0000 mg | INTRAMUSCULAR | Status: DC | PRN
  Administered 2013-07-31: 0.5 mg via INTRAVENOUS
  Administered 2013-07-31: 1 mg via INTRAVENOUS
  Administered 2013-08-01 (×2): 0.5 mg via INTRAVENOUS
  Administered 2013-08-01: 1 mg via INTRAVENOUS
  Administered 2013-08-01 (×3): 0.5 mg via INTRAVENOUS
  Administered 2013-08-01: 1 mg via INTRAVENOUS
  Filled 2013-07-31 (×7): qty 1

## 2013-07-31 MED ORDER — OXYCODONE HCL 5 MG/5ML PO SOLN: 5.0000 mg | Freq: Once | ORAL | Status: DC | PRN

## 2013-07-31 MED ORDER — FENTANYL CITRATE 0.05 MG/ML IJ SOLN
INTRAMUSCULAR | Status: DC | PRN
  Administered 2013-07-31: 50 ug via INTRAVENOUS
  Administered 2013-07-31: 100 ug via INTRAVENOUS
  Administered 2013-07-31: 50 ug via INTRAVENOUS
  Administered 2013-07-31: 100 ug via INTRAVENOUS

## 2013-07-31 MED ORDER — METHOCARBAMOL 100 MG/ML IJ SOLN
500.0000 mg | Freq: Four times a day (QID) | INTRAVENOUS | Status: DC | PRN
  Administered 2013-07-31: 500 mg via INTRAVENOUS
  Filled 2013-07-31: qty 5

## 2013-07-31 SURGICAL SUPPLY — 48 items
BLADE SAGITTAL 25.0X1.27X90 (BLADE) ×2 IMPLANT
BLADE SAW SGTL 13.0X1.19X90.0M (BLADE) ×2 IMPLANT
BLADE SURG 21 STRL SS (BLADE) ×2 IMPLANT
BNDG COHESIVE 6X5 TAN STRL LF (GAUZE/BANDAGES/DRESSINGS) ×4 IMPLANT
BONE CEMENT PALACOSE (Orthopedic Implant) ×4 IMPLANT
BOWL SMART MIX CTS (DISPOSABLE) ×2 IMPLANT
CAP POR TM CP VIT E LN CER HD ×2 IMPLANT
CEMENT BONE PALACOSE (Orthopedic Implant) ×2 IMPLANT
CLOTH BEACON ORANGE TIMEOUT ST (SAFETY) ×2 IMPLANT
COVER SURGICAL LIGHT HANDLE (MISCELLANEOUS) ×2 IMPLANT
CUFF TOURNIQUET SINGLE 34IN LL (TOURNIQUET CUFF) ×2 IMPLANT
CUFF TOURNIQUET SINGLE 44IN (TOURNIQUET CUFF) IMPLANT
DRAPE EXTREMITY T 121X128X90 (DRAPE) ×2 IMPLANT
DRAPE PROXIMA HALF (DRAPES) ×2 IMPLANT
DRAPE U-SHAPE 47X51 STRL (DRAPES) ×2 IMPLANT
DRSG ADAPTIC 3X8 NADH LF (GAUZE/BANDAGES/DRESSINGS) ×2 IMPLANT
DRSG PAD ABDOMINAL 8X10 ST (GAUZE/BANDAGES/DRESSINGS) ×2 IMPLANT
DURAPREP 26ML APPLICATOR (WOUND CARE) ×2 IMPLANT
ELECT REM PT RETURN 9FT ADLT (ELECTROSURGICAL) ×2
ELECTRODE REM PT RTRN 9FT ADLT (ELECTROSURGICAL) ×1 IMPLANT
FACESHIELD LNG OPTICON STERILE (SAFETY) ×2 IMPLANT
GLOVE BIOGEL PI IND STRL 9 (GLOVE) ×1 IMPLANT
GLOVE BIOGEL PI INDICATOR 9 (GLOVE) ×1
GLOVE SURG ORTHO 9.0 STRL STRW (GLOVE) ×4 IMPLANT
GOWN PREVENTION PLUS XLARGE (GOWN DISPOSABLE) ×2 IMPLANT
GOWN SRG XL XLNG 56XLVL 4 (GOWN DISPOSABLE) ×1 IMPLANT
GOWN STRL NON-REIN XL XLG LVL4 (GOWN DISPOSABLE) ×1
HANDPIECE INTERPULSE COAX TIP (DISPOSABLE)
KIT BASIN OR (CUSTOM PROCEDURE TRAY) ×2 IMPLANT
KIT ROOM TURNOVER OR (KITS) ×2 IMPLANT
MANIFOLD NEPTUNE II (INSTRUMENTS) ×2 IMPLANT
NEEDLE SPNL 18GX3.5 QUINCKE PK (NEEDLE) ×2 IMPLANT
NS IRRIG 1000ML POUR BTL (IV SOLUTION) ×2 IMPLANT
PACK TOTAL JOINT (CUSTOM PROCEDURE TRAY) ×2 IMPLANT
PAD ARMBOARD 7.5X6 YLW CONV (MISCELLANEOUS) ×4 IMPLANT
PADDING CAST COTTON 6X4 STRL (CAST SUPPLIES) ×2 IMPLANT
SET HNDPC FAN SPRY TIP SCT (DISPOSABLE) IMPLANT
SPONGE GAUZE 4X4 12PLY (GAUZE/BANDAGES/DRESSINGS) ×2 IMPLANT
STAPLER VISISTAT 35W (STAPLE) ×2 IMPLANT
SUCTION FRAZIER TIP 10 FR DISP (SUCTIONS) IMPLANT
SUT VIC AB 0 CTB1 27 (SUTURE) IMPLANT
SUT VIC AB 1 CTX 36 (SUTURE)
SUT VIC AB 1 CTX36XBRD ANBCTR (SUTURE) IMPLANT
TOWEL OR 17X24 6PK STRL BLUE (TOWEL DISPOSABLE) ×2 IMPLANT
TOWEL OR 17X26 10 PK STRL BLUE (TOWEL DISPOSABLE) ×2 IMPLANT
TRAY FOLEY CATH 16FRSI W/METER (SET/KITS/TRAYS/PACK) IMPLANT
WATER STERILE IRR 1000ML POUR (IV SOLUTION) ×4 IMPLANT
WRAP KNEE MAXI GEL POST OP (GAUZE/BANDAGES/DRESSINGS) ×2 IMPLANT

## 2013-07-31 NOTE — Progress Notes (Signed)
It was noted in pt's orders that a trapeze bar with overhang is needed. Ortho tech was paged, and no trapeze and overhang was available.  Autumn Chambers

## 2013-07-31 NOTE — H&P (Signed)
TOTAL KNEE ADMISSION H&P  Patient is being admitted for left total knee arthroplasty.  Subjective:  Chief Complaint:left knee pain.  HPI: Autumn Chambers, 72 y.o. female, has a history of pain and functional disability in the left knee due to arthritis and has failed non-surgical conservative treatments for greater than 12 weeks to includeNSAID's and/or analgesics, corticosteriod injections, viscosupplementation injections, weight reduction as appropriate and activity modification.  Onset of symptoms was gradual, starting 8 years ago with gradually worsening course since that time. The patient noted no past surgery on the left knee(s).  Patient currently rates pain in the left knee(s) at 8 out of 10 with activity. Patient has night pain, worsening of pain with activity and weight bearing, pain that interferes with activities of daily living, pain with passive range of motion, crepitus and joint swelling.  Patient has evidence of subchondral cysts, subchondral sclerosis and joint space narrowing by imaging studies. This patient has had Failure of conservative care. There is no active infection.  Patient Active Problem List   Diagnosis Date Noted  . Breast cancer 01/31/2013   Past Medical History  Diagnosis Date  . Hypertension   . Hyperlipidemia   . Shortness of breath     "mild upon exertion"  . Diabetes mellitus     oral  . Hypothyroidism   . Anemia   . Urinary tract infection     hx of  . H/O hiatal hernia   . Irritable bowel   . Cancer     hx of skin ca  . Arthritis   . Osteoporosis 02/2013    T score -2.9  . PONV (postoperative nausea and vomiting)     yearsago  . Hearing loss   . Chronic kidney disease     CRI- sees Dr Allena Katz    Past Surgical History  Procedure Laterality Date  . Skin cancer excision      approx 4 years ago.  Nose  . Achilles tendon surgery    . Eye surgery      bilateral cataract surgery  . Tonsillectomy    . Dilation and curettage of uterus    .  Breast lumpectomy      left, with 2 nodes removed  . Total knee arthroplasty  01/04/2012    Procedure: TOTAL KNEE ARTHROPLASTY;  Surgeon: Nadara Mustard, MD;  Location: [facility] OR;  Service: Orthopedics;  Laterality: Right;  Right Total Knee Arthroplasty    Prescriptions prior to admission  Medication Sig Dispense Refill  . alendronate (FOSAMAX) 70 MG tablet Take 70 mg by mouth every 7 (seven) days. Take with a full glass of water on an empty stomach.      Marland Kitchen CALCIUM PO Take 1,500 mg by mouth 2 (two) times daily.      . celecoxib (CELEBREX) 200 MG capsule Take 200 mg by mouth daily.      . cholecalciferol (VITAMIN D) 1000 UNITS tablet Take 1,000 Units by mouth daily.      . diazepam (VALIUM) 2 MG tablet Take 2 mg by mouth daily.      . diazepam (VALIUM) 2 MG tablet Take 2 mg by mouth daily.      . diphenhydramine-acetaminophen (TYLENOL PM) 25-500 MG TABS Take 1 tablet by mouth at bedtime as needed. For sleep      . esomeprazole (NEXIUM) 40 MG capsule Take 40 mg by mouth daily before breakfast.      . Glucosamine 500 MG CAPS Take 1 capsule by mouth  3 (three) times daily.      . irbesartan (AVAPRO) 300 MG tablet Take 300 mg by mouth at bedtime.      Marland Kitchen levothyroxine (SYNTHROID, LEVOTHROID) 125 MCG tablet Take 125 mcg by mouth daily before breakfast.      . pioglitazone (ACTOS) 30 MG tablet Take 30 mg by mouth daily.      . sitaGLIPtin (JANUVIA) 100 MG tablet Take 100 mg by mouth daily.      . tamoxifen (NOLVADEX) 20 MG tablet Take 20 mg by mouth daily.      Marland Kitchen triamterene-hydrochlorothiazide (MAXZIDE-25) 37.5-25 MG per tablet Take 1 tablet by mouth daily.       Allergies  Allergen Reactions  . Darvon Other (See Comments)    Gets her "high"    History  Substance Use Topics  . Smoking status: Former Smoker -- 40 years    Types: Cigarettes  . Smokeless tobacco: Never Used     Comment: stopped smoking 2009  . Alcohol Use: No    Family History  Problem Relation Age of Onset  . Cancer Father      Prostate  . Cancer Sister     Pancreatic cancer  . Cancer Brother     Brain cancer  . Hypertension Brother      Review of Systems  All other systems reviewed and are negative.    Objective:  Physical Exam  Vital signs in last 24 hours:    Labs:   Estimated body mass index is 39.51 kg/(m^2) as calculated from the following:   Height as of 01/31/13: 5\' 8"  (1.727 m).   Weight as of 01/31/13: 117.845 kg (259 lb 12.8 oz).   Imaging Review Plain radiographs demonstrate moderate degenerative joint disease of the left knee(s). The overall alignment isneutral. The bone quality appears to be adequate for age and reported activity level.  Assessment/Plan:  End stage arthritis, left knee   The patient history, physical examination, clinical judgment of the provider and imaging studies are consistent with end stage degenerative joint disease of the left knee(s) and total knee arthroplasty is deemed medically necessary. The treatment options including medical management, injection therapy arthroscopy and arthroplasty were discussed at length. The risks and benefits of total knee arthroplasty were presented and reviewed. The risks due to aseptic loosening, infection, stiffness, patella tracking problems, thromboembolic complications and other imponderables were discussed. The patient acknowledged the explanation, agreed to proceed with the plan and consent was signed. Patient is being admitted for inpatient treatment for surgery, pain control, PT, OT, prophylactic antibiotics, VTE prophylaxis, progressive ambulation and ADL's and discharge planning. The patient is planning to be discharged home with home health services

## 2013-07-31 NOTE — Anesthesia Postprocedure Evaluation (Signed)
  Anesthesia Post-op Note  Patient: Autumn Chambers  Procedure(s) Performed: Procedure(s) with comments: LEFT TOTAL KNEE ARTHROPLASTY (Left) - Left Total Knee Arthroplasty  Patient Location: PACU  Anesthesia Type:GA combined with regional for post-op pain  Level of Consciousness: awake, alert  and oriented  Airway and Oxygen Therapy: Patient Spontanous Breathing and Patient connected to nasal cannula oxygen  Post-op Pain: mild  Post-op Assessment: Post-op Vital signs reviewed, Patient's Cardiovascular Status Stable, Respiratory Function Stable, Patent Airway and No signs of Nausea or vomiting  Post-op Vital Signs: Reviewed and stable  Complications: No apparent anesthesia complications

## 2013-07-31 NOTE — Preoperative (Signed)
Beta Blockers   Reason not to administer Beta Blockers:Not Applicable 

## 2013-07-31 NOTE — Anesthesia Procedure Notes (Signed)
Anesthesia Regional Block:  Femoral nerve block  Pre-Anesthetic Checklist: ,, timeout performed, Correct Patient, Correct Site, Correct Laterality, Correct Procedure, Correct Position, site marked, Risks and benefits discussed, pre-op evaluation,  At surgeon's request and post-op pain management  Laterality: Left  Prep: Maximum Sterile Barrier Precautions used and chloraprep       Needles:  Injection technique: Single-shot  Needle Type: Echogenic Stimulator Needle     Needle Length: 5cm 5 cm Needle Gauge: 22 and 22 G    Additional Needles:  Procedures: ultrasound guided (picture in chart) Femoral nerve block  Nerve Stimulator or Paresthesia:  Response: Patellar respose,   Additional Responses:   Narrative:  Start time: 07/31/2013 8:10 AM End time: 07/31/2013 8:17 AM Injection made incrementally with aspirations every 5 mL. Anesthesiologist: Xeng Kucher,MD  Additional Notes: 2% Lidocaine skin wheel.   Femoral nerve block

## 2013-07-31 NOTE — Progress Notes (Signed)
Orthopedic Tech Progress Note Patient Details:  Autumn Chambers 06-Aug-1941 [id number]  Ortho Devices Ortho Device/Splint Location: footsie roll Ortho Device/Splint Interventions: Ordered;Application   Jennye Moccasin 07/31/2013, 4:33 PM

## 2013-07-31 NOTE — Anesthesia Preprocedure Evaluation (Addendum)
Anesthesia Evaluation  Patient identified by MRN, date of birth, ID band Patient awake    Reviewed: Allergy & Precautions, H&P , NPO status , Patient's Chart, lab work & pertinent test results  History of Anesthesia Complications (+) PONV  Airway Mallampati: III TM Distance: >3 FB Neck ROM: Full    Dental no notable dental hx. (+) Teeth Intact and Dental Advisory Given   Pulmonary sleep apnea and Continuous Positive Airway Pressure Ventilation ,  breath sounds clear to auscultation  Pulmonary exam normal       Cardiovascular hypertension, On Medications Rhythm:Regular Rate:Normal     Neuro/Psych negative neurological ROS  negative psych ROS   GI/Hepatic Neg liver ROS, hiatal hernia, Medicated and Controlled,  Endo/Other  diabetes, Type 2, Oral Hypoglycemic AgentsHypothyroidism Morbid obesity  Renal/GU negative Renal ROS  negative genitourinary   Musculoskeletal   Abdominal   Peds  Hematology negative hematology ROS (+) anemia ,   Anesthesia Other Findings   Reproductive/Obstetrics negative OB ROS                         Anesthesia Physical Anesthesia Plan  ASA: III  Anesthesia Plan: General and Regional   Post-op Pain Management:    Induction: Intravenous  Airway Management Planned: LMA and Oral ETT  Additional Equipment:   Intra-op Plan:   Post-operative Plan: Extubation in OR  Informed Consent: I have reviewed the patients History and Physical, chart, labs and discussed the procedure including the risks, benefits and alternatives for the proposed anesthesia with the patient or authorized representative who has indicated his/her understanding and acceptance.   Dental advisory given  Plan Discussed with: CRNA  Anesthesia Plan Comments:         Anesthesia Quick Evaluation

## 2013-07-31 NOTE — Op Note (Signed)
OPERATIVE REPORT  DATE OF SURGERY: 07/31/2013  PATIENT:  Ernestene Kiel,  72 y.o. female  PRE-OPERATIVE DIAGNOSIS:  Left Knee Osteoarthritis   POST-OPERATIVE DIAGNOSIS:  left knee osteoarthritis  PROCEDURE:  Procedure(s): LEFT TOTAL KNEE ARTHROPLASTY Zimmer components. Size 8 femur. Size F. tibia. 12 mm polyethylene tray. 29 mm patella.   SURGEON:  Surgeon(s): Nadara Mustard, MD  ANESTHESIA:   regional and general  EBL:  Minimal ML  SPECIMEN:  No Specimen  TOURNIQUET:   Total Tourniquet Time Documented: Thigh (Left) - 40 minutes Total: Thigh (Left) - 40 minutes   PROCEDURE DETAILS: Patient is a 72 year old woman with osteoarthritis of her left knee presents at this time for total knee arthroplasty after failure conservative care. Risks and benefits were discussed including infection DVT. Risk of hardware failure was discussed. Patient states she understands and wished to proceed at this time. Description of procedure patient was brought to the operating room and underwent a general anesthetic. After adequate levels of anesthesia were obtained patient's left lower extremity was prepped using DuraPrep draped into a sterile field an [place] was used to cover all exposed skin. A midline incision was made carried down to a medial parapatellar retinacular incision. The IM guide was used to take resection block 10 mm off the distal femur with 5 of valgus. The cutting block was then sized for a size 8 and the chamfer cuts were made for the size 8 femur. Attention was then focused on the tibia. 10 mm was taken off the tibia with a 7 posterior slope neutral varus and valgus using external alignment. This was then keel punch for the size F. tibia. Trial components were placed proximal cut was made on the femur and the femur was sequentially tried with 1 mm increments rays and the 12 mm tray had good stability. The patella was resurfaced and lug cuts were made for the size 29 patella. The  knee was irrigated with pulsatile lavage. Meniscal tissue was removed. The popliteal fossa and joint was injected with Exparel total of 60 cc. Component was then cemented in place loose cement was removed. The patella tracked midline. The knee was again irrigated with pulsatile lavage. The knee was left in extension and the patella clamped until the cement hardened. The retinaculum was closed with #1 Vicryl. Subcutaneous was closed using 0 Vicryl. Skin was closing staples. The wound is covered with Adaptic orthopedic sponges AB dressing web wall and Coban. Patient was extubated taken the PACU in stable condition.  PLAN OF CARE: Admit to inpatient   PATIENT DISPOSITION:  PACU - hemodynamically stable.   Nadara Mustard, MD 07/31/2013 10:13 AM

## 2013-07-31 NOTE — Transfer of Care (Signed)
Immediate Anesthesia Transfer of Care Note  Patient: Autumn Chambers  Procedure(s) Performed: Procedure(s) with comments: LEFT TOTAL KNEE ARTHROPLASTY (Left) - Left Total Knee Arthroplasty  Patient Location: PACU  Anesthesia Type:General  Level of Consciousness: awake, alert  and oriented  Airway & Oxygen Therapy: Patient Spontanous Breathing and Patient connected to nasal cannula oxygen  Post-op Assessment: Report given to PACU RN and Post -op Vital signs reviewed and stable  Post vital signs: Reviewed and stable  Complications: No apparent anesthesia complications

## 2013-08-01 NOTE — Clinical Documentation Improvement (Signed)
THIS DOCUMENT IS NOT A PERMANENT PART OF THE MEDICAL RECORD  Please update your documentation with the medical record to reflect your response to this query. If you need help knowing how to do this please call [phone].  08/01/13   Dear Dr. Cristopher Peru,  In a better effort to capture your patient's severity of illness, reflect appropriate length of stay and utilization of resources, a review of the patient medical record has revealed the following indicators.    Based on your clinical judgment, please clarify and document in a progress note and/or discharge summary the clinical condition associated with the following supporting information:  In responding to this query please exercise your independent judgment.  The fact that a query is asked, does not imply that any particular answer is desired or expected.   Possible Clinical Conditions?   "     CKD Stage III - GFR 30-59  "     CKD Stage IV - GFR 15-29  "     CKD Stage V - GFR < 15  "     Other condition  "     Cannot Clinically determine     Risk Factors:  CKD noted per 07/31/13 H&P.   Lab:  07/23/13  Bun:    26 Creat: 1.53 GFR:   33    You may use possible, probable, or suspect with inpatient documentation. possible, probable, suspected diagnoses MUST be documented at the time of discharge  Reviewed:  no additional documentation provided Chart updated, no additional documentation  Thank You,  Marciano Sequin,  Clinical Documentation Specialist: [phone] Health Information Management [place]

## 2013-08-01 NOTE — Progress Notes (Signed)
Patient placed on CPAP. Machine set at [employer] and patient is using her home nasal pillows mask.  Patient tolerating well at this time.  RT will continue to monitor.

## 2013-08-01 NOTE — Progress Notes (Signed)
Pt has orders to t/x to 5N however, no bed is available on that floor today. I was asked by my charge nurse , Edilia Bo, to see if the pt could t/x to another floor. Spoke with Autumn at Dr. Audrie Lia office and that the patient could be moved to any bed/ level of care in the hospital.   Felipa Emory

## 2013-08-01 NOTE — Care Management (Signed)
[facility] is following and will provide HH as necessary. This referral came from the doctor's office. Thank you.  Ayesha Rumpf RN BSN

## 2013-08-01 NOTE — Care Management Note (Signed)
    Page 1 of 1   08/01/2013     11:23:36 AM   CARE MANAGEMENT NOTE 08/01/2013  Patient:  Autumn Chambers, Autumn Chambers   Account Number:  [id number]  Date Initiated:  08/01/2013  Documentation initiated by:  Charlee Squibb  Subjective/Objective Assessment:   adm with dx of DJD (L) knee s/p TKA; lives alone     In-house referral  Clinical Social Worker      DC Associate Professor  CM consult       Per UR Regulation:  Reviewed for med. necessity/level of care/duration of stay  Comments:  08/01/13 1117 Latorsha Curling RN MSN BSN CCM Pt states she does not have support @ home and will need ST-SNF for rehab, prefers [facility] - CSW notified.

## 2013-08-01 NOTE — Progress Notes (Signed)
Patient ID: Autumn Chambers, female   DOB: 12/18/1940, 72 y.o.   MRN: [id number] Postoperative day 1 total knee arthroplasty. Patient states she is little bit of pain. She was kept overnight in the step down unit for her sleep apnea she tolerated her CPAP well. We will transfer to 5 N.  Patient will require short-term skilled nursing and patient has requested [facility] for discharge.

## 2013-08-01 NOTE — Evaluation (Signed)
Occupational Therapy Evaluation Patient Details Name: Autumn Chambers MRN: [id number] DOB: 1941-07-31 Today's Date: 08/01/2013 Time: 4098-1191 OT Time Calculation (min): 15 min  OT Assessment / Plan / Recommendation History of present illness L TKA   Clinical Impression   Pt admitted with above.  Recommending SNF since pt does not have necessary caregiver support at home. Will defer further OT needs to next venue of care.     OT Assessment  All further OT needs can be met in the next venue of care    Follow Up Recommendations  SNF    Barriers to Discharge      Equipment Recommendations   (TBD at next venue)    Recommendations for Other Services    Frequency       Precautions / Restrictions Precautions Precautions: Fall;Knee Restrictions LLE Weight Bearing: Weight bearing as tolerated   Pertinent Vitals/Pain See vitals    ADL  Grooming: Performed;Wash/dry hands;Brushing hair;Minimal assistance Where Assessed - Grooming: Supported standing Upper Body Bathing: Simulated;Set up Where Assessed - Upper Body Bathing: Unsupported sitting Lower Body Bathing: Simulated;Moderate assistance Where Assessed - Lower Body Bathing: Supported sit to stand Upper Body Dressing: Simulated;Set up Where Assessed - Upper Body Dressing: Unsupported sitting Lower Body Dressing: Simulated;Maximal assistance Where Assessed - Lower Body Dressing: Supported sit to stand Toilet Transfer: Simulated;Minimal assistance Toilet Transfer Method: Sit to Barista:  (simulated with chair) Equipment Used: Rolling walker;Gait belt Transfers/Ambulation Related to ADLs: Min assist with RW ADL Comments: Pt stood at sink for grooming task with chair positioned behind her.  Pt fatigued after completing grooming and returned to sitting in chair.    OT Diagnosis:    OT Problem List:   OT Treatment Interventions:     OT Goals(Current goals can be found in the care plan section) Acute  Rehab OT Goals Patient Stated Goal: return home  Visit Information  Last OT Received On: 08/01/13 Assistance Needed: +1 History of Present Illness: L TKA       Prior Functioning     Home Living Family/patient expects to be discharged to:: Skilled nursing facility Living Arrangements: Alone Additional Comments: pt planning to go to [facility] Prior Function Level of Independence: Independent Communication Communication: HOH         Vision/Perception     Cognition  Cognition Arousal/Alertness: Awake/alert Behavior During Therapy: WFL for tasks assessed/performed Overall Cognitive Status: Within Functional Limits for tasks assessed    Extremity/Trunk Assessment Upper Extremity Assessment Upper Extremity Assessment: Overall WFL for tasks assessed     Mobility Bed Mobility Bed Mobility: Not assessed (pt up in chair) Transfers Transfers: Sit to Stand;Stand to Sit Sit to Stand: 4: Min assist;From chair/3-in-1;With upper extremity assist;With armrests Stand to Sit: 4: Min assist;To chair/3-in-1;With armrests;With upper extremity assist Details for Transfer Assistance: VCs for safe hand placement     Exercise     Balance     End of Session OT - End of Session Equipment Utilized During Treatment: Gait belt;Rolling walker Activity Tolerance: Patient limited by fatigue Patient left: in chair;with call bell/phone within reach  GO   08/01/2013 Cipriano Mile OTR/L Pager [phone] Office [phone]   Cipriano Mile 08/01/2013, 2:40 PM

## 2013-08-01 NOTE — Evaluation (Signed)
Physical Therapy Evaluation Patient Details Name: Autumn Chambers MRN: [id number] DOB: 06-Nov-1940 Today's Date: 08/01/2013 Time: 7829-5621 PT Time Calculation (min): 16 min  PT Assessment / Plan / Recommendation History of Present Illness  L TKA  Clinical Impression  Pt very eager to return home but lacks support of caregiver and plans to DC to [facility] for rehab. Pt very HOH even with hearing aids. Pt educated for precautions, HEP and transfers and will benefit from acute therapy to maximize ROM, strength, transfers and gait to increase independence and awareness of precautions.     PT Assessment  Patient needs continued PT services    Follow Up Recommendations  SNF    Does the patient have the potential to tolerate intense rehabilitation      Barriers to Discharge Decreased caregiver support      Equipment Recommendations  None recommended by PT    Recommendations for Other Services OT consult   Frequency 7X/week    Precautions / Restrictions Precautions Precautions: Fall;Knee Restrictions Weight Bearing Restrictions: Yes LLE Weight Bearing: Weight bearing as tolerated   Pertinent Vitals/Pain 8/10 pain Left knee, ice reapplied HR 101 sats 96% on RA      Mobility  Bed Mobility Bed Mobility: Supine to Sit Supine to Sit: 4: Min assist;HOB elevated;With rails Details for Bed Mobility Assistance: assist to bring LLE to EOB and min assist to elevate trunk from surface with cueing for sequence Transfers Transfers: Sit to Stand;Stand to Sit Sit to Stand: 4: Min assist;From bed;From elevated surface Stand to Sit: 4: Min assist;To chair/3-in-1;With armrests Details for Transfer Assistance: cueing for hand placement, sequence and safety with transfers Ambulation/Gait Ambulation/Gait Assistance: 4: Min assist Ambulation Distance (Feet): 5 Feet Assistive device: Rolling walker Ambulation/Gait Assistance Details: cueing for sequence and posture, distance limited by  pain and fatigue Gait Pattern: Step-to pattern;Antalgic Gait velocity: decreased Stairs: No    Exercises Total Joint Exercises Ankle Circles/Pumps: AROM;Left;5 reps;Supine Quad Sets: AROM;Left;5 reps;Supine Heel Slides: AAROM;Left;10 reps;Supine   PT Diagnosis: Difficulty walking;Acute pain  PT Problem List: Decreased strength;Decreased range of motion;Decreased activity tolerance;Decreased mobility;Decreased knowledge of precautions;Decreased knowledge of use of DME;Obesity;Pain PT Treatment Interventions: Functional mobility training;Therapeutic activities;Therapeutic exercise;Patient/family education;Gait training;DME instruction     PT Goals(Current goals can be found in the care plan section) Acute Rehab PT Goals Patient Stated Goal: return home to my cats PT Goal Formulation: With patient Time For Goal Achievement: 08/15/13 Potential to Achieve Goals: Good  Visit Information  Last PT Received On: 08/01/13 Assistance Needed: +1 History of Present Illness: L TKA       Prior Functioning  Home Living Family/patient expects to be discharged to:: Skilled nursing facility Living Arrangements: Alone Prior Function Level of Independence: Independent Communication Communication: HOH    Cognition  Cognition Arousal/Alertness: Awake/alert Behavior During Therapy: WFL for tasks assessed/performed Overall Cognitive Status: Within Functional Limits for tasks assessed    Extremity/Trunk Assessment Upper Extremity Assessment Upper Extremity Assessment: Defer to OT evaluation Lower Extremity Assessment Lower Extremity Assessment: LLE deficits/detail LLE Deficits / Details: grossly 2+/5 limited by pain post op Cervical / Trunk Assessment Cervical / Trunk Assessment: Normal   Balance    End of Session PT - End of Session Equipment Utilized During Treatment: Gait belt Activity Tolerance: Patient tolerated treatment well Patient left: in chair;with call bell/phone within  reach Nurse Communication: Mobility status  GP     Autumn Chambers 08/01/2013, 10:13 AM Autumn Chambers, PT [phone]

## 2013-08-02 ENCOUNTER — Other Ambulatory Visit: Payer: Self-pay | Admitting: *Deleted

## 2013-08-02 ENCOUNTER — Encounter (HOSPITAL_COMMUNITY): Payer: Self-pay | Admitting: Orthopedic Surgery

## 2013-08-02 DIAGNOSIS — M1712 Unilateral primary osteoarthritis, left knee: Secondary | ICD-10-CM | POA: Diagnosis present

## 2013-08-02 LAB — GLUCOSE, CAPILLARY
Glucose-Capillary: 135 mg/dL — ABNORMAL HIGH (ref 70–99)
Glucose-Capillary: 121 mg/dL — ABNORMAL HIGH (ref 70–99)
Glucose-Capillary: 124 mg/dL — ABNORMAL HIGH (ref 70–99)

## 2013-08-02 MED ORDER — INFLUENZA VAC SPLIT QUAD 0.5 ML IM SUSP
0.5000 mL | INTRAMUSCULAR | Status: AC
  Administered 2013-08-03: 0.5 mL via INTRAMUSCULAR
  Filled 2013-08-02: qty 0.5

## 2013-08-02 MED ORDER — OXYCODONE-ACETAMINOPHEN 5-325 MG PO TABS: ORAL_TABLET | ORAL | Status: DC

## 2013-08-02 MED ORDER — OXYCODONE-ACETAMINOPHEN 5-325 MG PO TABS: 1.0000 | ORAL_TABLET | ORAL | Status: DC | PRN

## 2013-08-02 MED ORDER — METHOCARBAMOL 500 MG PO TABS: 500.0000 mg | ORAL_TABLET | Freq: Four times a day (QID) | ORAL | Status: DC | PRN

## 2013-08-02 MED ORDER — DIAZEPAM 2 MG PO TABS: 2.0000 mg | ORAL_TABLET | Freq: Every day | ORAL | Status: DC

## 2013-08-02 NOTE — Discharge Summary (Signed)
Patient ID: Autumn Chambers MRN: [id number] DOB/AGE: 1941-03-26 72 y.o.  Admit date: 07/31/2013 Discharge date: 08/02/2013  Admission Diagnoses:  Active Problems:   * No active hospital problems. *   Discharge Diagnoses:  Same  Past Medical History  Diagnosis Date  . Hypertension   . Hyperlipidemia   . Shortness of breath     "mild upon exertion"  . Diabetes mellitus     oral  . Hypothyroidism   . Anemia   . Urinary tract infection     hx of  . H/O hiatal hernia   . Irritable bowel   . Cancer     hx of skin ca  . Arthritis   . Osteoporosis 02/2013    T score -2.9  . PONV (postoperative nausea and vomiting)     yearsago  . Hearing loss   . Chronic kidney disease     CRI- sees Dr Allena Katz    Surgeries: Procedure(s): LEFT TOTAL KNEE ARTHROPLASTY on 07/31/2013   Consultants:    Discharged Condition: Improved  Hospital Course: CLYDE UPSHAW is an 72 y.o. female who was admitted 07/31/2013 for operative treatment of<principal problem not specified>. Patient has severe unremitting pain that affects sleep, daily activities, and work/hobbies. After pre-op clearance the patient was taken to the operating room on 07/31/2013 and underwent  Procedure(s): LEFT TOTAL KNEE ARTHROPLASTY.    Patient was given perioperative antibiotics: Anti-infectives   Start     Dose/Rate Route Frequency Ordered Stop   07/31/13 1600  ceFAZolin (ANCEF) IVPB 2 g/50 mL premix     2 g 100 mL/hr over 30 Minutes Intravenous Every 6 hours 07/31/13 1547 07/31/13 2259   07/31/13 0600  ceFAZolin (ANCEF) IVPB 2 g/50 mL premix     2 g 100 mL/hr over 30 Minutes Intravenous On call to O.R. 07/30/13 1411 07/31/13 0865       Patient was given sequential compression devices, early ambulation, and chemoprophylaxis to prevent DVT.  Patient benefited maximally from hospital stay and there were no complications.    Recent vital signs: Patient Vitals for the past 24 hrs:  BP Temp Temp src Pulse Resp SpO2   08/02/13 0626 120/77 mmHg 99 F (37.2 C) Oral 88 16 97 %  08/01/13 1941 124/56 mmHg 99.9 F (37.7 C) Oral 106 16 96 %  08/01/13 1900 - - - - 10 -  08/01/13 1800 - - - - 21 -  08/01/13 1700 - - - - 15 -  08/01/13 1600 124/43 mmHg - - - 11 -  08/01/13 1545 - 98.7 F (37.1 C) Oral - - -  08/01/13 1400 - - - - 6 -  08/01/13 1300 - - - - 9 -  08/01/13 1200 - - - - 9 -  08/01/13 1100 106/40 mmHg - - 91 7 98 %     Recent laboratory studies: No results found for this basename: WBC, HGB, HCT, PLT, NA, K, CL, CO2, BUN, CREATININE, GLUCOSE, PT, INR, CALCIUM, 2,  in the last 72 hours   Discharge Medications:     Medication List         alendronate 70 MG tablet  Commonly known as:  FOSAMAX  Take 70 mg by mouth every 7 (seven) days. Take with a full glass of water on an empty stomach.     CALCIUM PO  Take 1,500 mg by mouth 2 (two) times daily.     celecoxib 200 MG capsule  Commonly known as:  CELEBREX  Take 200 mg by mouth daily.     cholecalciferol 1000 UNITS tablet  Commonly known as:  VITAMIN D  Take 1,000 Units by mouth daily.     diazepam 2 MG tablet  Commonly known as:  VALIUM  Take 2 mg by mouth daily.     diazepam 2 MG tablet  Commonly known as:  VALIUM  Take 2 mg by mouth daily.     diphenhydramine-acetaminophen 25-500 MG Tabs  Commonly known as:  TYLENOL PM  Take 1 tablet by mouth at bedtime as needed. For sleep     esomeprazole 40 MG capsule  Commonly known as:  NEXIUM  Take 40 mg by mouth daily before breakfast.     Glucosamine 500 MG Caps  Take 1 capsule by mouth 3 (three) times daily.     irbesartan 300 MG tablet  Commonly known as:  AVAPRO  Take 300 mg by mouth at bedtime.     levothyroxine 125 MCG tablet  Commonly known as:  SYNTHROID, LEVOTHROID  Take 125 mcg by mouth daily before breakfast.     methocarbamol 500 MG tablet  Commonly known as:  ROBAXIN  Take 1 tablet (500 mg total) by mouth every 6 (six) hours as needed.      oxyCODONE-acetaminophen 5-325 MG per tablet  Commonly known as:  ROXICET  Take 1 tablet by mouth every 4 (four) hours as needed for pain.     pioglitazone 30 MG tablet  Commonly known as:  ACTOS  Take 30 mg by mouth daily.     sitaGLIPtin 100 MG tablet  Commonly known as:  JANUVIA  Take 100 mg by mouth daily.     tamoxifen 20 MG tablet  Commonly known as:  NOLVADEX  Take 20 mg by mouth daily.     triamterene-hydrochlorothiazide 37.5-25 MG per tablet  Commonly known as:  MAXZIDE-25  Take 1 tablet by mouth daily.        Diagnostic Studies: Dg Chest 2 View  07/23/2013   CLINICAL DATA:  Preop for left knee surgery  EXAM: CHEST  2 VIEW  COMPARISON:  12/21/2011  FINDINGS: Cardiomediastinal silhouette is stable. Mild hyperinflation. Degenerative changes thoracic spine. No acute infiltrate or pleural effusion. No pulmonary edema.  IMPRESSION: No active cardiopulmonary disease.   Electronically Signed   By: Natasha Mead   On: 07/23/2013 15:47   Dg Knee 1-2 Views Left  07/31/2013   CLINICAL DATA:  Knee replacement  EXAM: LEFT KNEE - 1-2 VIEW  COMPARISON:  None.  FINDINGS: Total knee replacement. Prosthesis in satisfactory position alignment. No fracture or complication  IMPRESSION: Satisfactory total knee replacement.   Electronically Signed   By: Marlan Palau M.D.   On: 07/31/2013 13:15    Disposition: 03-Skilled Nursing Facility       Future Appointments Provider Department Dept Phone   09/05/2013 1:00 PM Mauri [place] [facility] [phone]   09/05/2013 1:30 PM Lowella Dell, MD [facility] MEDICAL ONCOLOGY [phone]      Follow-up Information   Follow up with DUDA,MARCUS V, MD In 2 weeks.   Specialty:  Orthopedic Surgery   Contact information:   [address] [place] [address] [phone]        Signed: Kathryne Hitch 08/02/2013, 10:37 AM

## 2013-08-02 NOTE — Discharge Summary (Signed)
Patient ID: Autumn Chambers MRN: [id number] DOB/AGE: 1941/05/17 72 y.o.  Admit date: 07/31/2013 Discharge date: 08/02/2013  Admission Diagnoses:  Principal Problem:   Osteoarthritis of left knee   Discharge Diagnoses:  Same  Past Medical History  Diagnosis Date  . Hypertension   . Hyperlipidemia   . Shortness of breath     "mild upon exertion"  . Diabetes mellitus     oral  . Hypothyroidism   . Anemia   . Urinary tract infection     hx of  . H/O hiatal hernia   . Irritable bowel   . Cancer     hx of skin ca  . Arthritis   . Osteoporosis 02/2013    T score -2.9  . PONV (postoperative nausea and vomiting)     yearsago  . Hearing loss   . Chronic kidney disease     CRI- sees Dr Allena Katz    Surgeries: Procedure(s): LEFT TOTAL KNEE ARTHROPLASTY on 07/31/2013   Consultants:    Discharged Condition: Improved  Hospital Course: Autumn Chambers is an 72 y.o. female who was admitted 07/31/2013 for operative treatment ofOsteoarthritis of left knee. Patient has severe unremitting pain that affects sleep, daily activities, and work/hobbies. After pre-op clearance the patient was taken to the operating room on 07/31/2013 and underwent  Procedure(s): LEFT TOTAL KNEE ARTHROPLASTY.    Patient was given perioperative antibiotics:     Anti-infectives   Start     Dose/Rate Route Frequency Ordered Stop   07/31/13 1600  ceFAZolin (ANCEF) IVPB 2 g/50 mL premix     2 g 100 mL/hr over 30 Minutes Intravenous Every 6 hours 07/31/13 1547 07/31/13 2259   07/31/13 0600  ceFAZolin (ANCEF) IVPB 2 g/50 mL premix     2 g 100 mL/hr over 30 Minutes Intravenous On call to O.R. 07/30/13 1411 07/31/13 6578       Patient was given sequential compression devices, early ambulation, and chemoprophylaxis to prevent DVT.  Patient benefited maximally from hospital stay and there were no complications.    Recent vital signs:  Patient Vitals for the past 24 hrs:  BP Temp Temp src Pulse Resp SpO2   08/02/13 0626 120/77 mmHg 99 F (37.2 C) Oral 88 16 97 %  08/01/13 1941 124/56 mmHg 99.9 F (37.7 C) Oral 106 16 96 %  08/01/13 1900 - - - - 10 -  08/01/13 1800 - - - - 21 -  08/01/13 1700 - - - - 15 -  08/01/13 1600 124/43 mmHg - - - 11 -  08/01/13 1545 - 98.7 F (37.1 C) Oral - - -  08/01/13 1400 - - - - 6 -  08/01/13 1300 - - - - 9 -  08/01/13 1200 - - - - 9 -  08/01/13 1100 106/40 mmHg - - 91 7 98 %     Recent laboratory studies: No results found for this basename: WBC, HGB, HCT, PLT, NA, K, CL, CO2, BUN, CREATININE, GLUCOSE, PT, INR, CALCIUM, 2,  in the last 72 hours   Discharge Medications:     Medication List         alendronate 70 MG tablet  Commonly known as:  FOSAMAX  Take 70 mg by mouth every 7 (seven) days. Take with a full glass of water on an empty stomach.     CALCIUM PO  Take 1,500 mg by mouth 2 (two) times daily.     celecoxib 200 MG capsule  Commonly known as:  CELEBREX  Take 200 mg by mouth daily.     cholecalciferol 1000 UNITS tablet  Commonly known as:  VITAMIN D  Take 1,000 Units by mouth daily.     diazepam 2 MG tablet  Commonly known as:  VALIUM  Take 2 mg by mouth daily.     diazepam 2 MG tablet  Commonly known as:  VALIUM  Take 2 mg by mouth daily.     diphenhydramine-acetaminophen 25-500 MG Tabs  Commonly known as:  TYLENOL PM  Take 1 tablet by mouth at bedtime as needed. For sleep     esomeprazole 40 MG capsule  Commonly known as:  NEXIUM  Take 40 mg by mouth daily before breakfast.     Glucosamine 500 MG Caps  Take 1 capsule by mouth 3 (three) times daily.     irbesartan 300 MG tablet  Commonly known as:  AVAPRO  Take 300 mg by mouth at bedtime.     levothyroxine 125 MCG tablet  Commonly known as:  SYNTHROID, LEVOTHROID  Take 125 mcg by mouth daily before breakfast.     methocarbamol 500 MG tablet  Commonly known as:  ROBAXIN  Take 1 tablet (500 mg total) by mouth every 6 (six) hours as needed.      oxyCODONE-acetaminophen 5-325 MG per tablet  Commonly known as:  ROXICET  Take 1 tablet by mouth every 4 (four) hours as needed for pain.     pioglitazone 30 MG tablet  Commonly known as:  ACTOS  Take 30 mg by mouth daily.     sitaGLIPtin 100 MG tablet  Commonly known as:  JANUVIA  Take 100 mg by mouth daily.     tamoxifen 20 MG tablet  Commonly known as:  NOLVADEX  Take 20 mg by mouth daily.     triamterene-hydrochlorothiazide 37.5-25 MG per tablet  Commonly known as:  MAXZIDE-25  Take 1 tablet by mouth daily.        Diagnostic Studies: Dg Chest 2 View  07/23/2013   CLINICAL DATA:  Preop for left knee surgery  EXAM: CHEST  2 VIEW  COMPARISON:  12/21/2011  FINDINGS: Cardiomediastinal silhouette is stable. Mild hyperinflation. Degenerative changes thoracic spine. No acute infiltrate or pleural effusion. No pulmonary edema.  IMPRESSION: No active cardiopulmonary disease.   Electronically Signed   By: Natasha Mead   On: 07/23/2013 15:47   Dg Knee 1-2 Views Left  07/31/2013   CLINICAL DATA:  Knee replacement  EXAM: LEFT KNEE - 1-2 VIEW  COMPARISON:  None.  FINDINGS: Total knee replacement. Prosthesis in satisfactory position alignment. No fracture or complication  IMPRESSION: Satisfactory total knee replacement.   Electronically Signed   By: Marlan Palau M.D.   On: 07/31/2013 13:15    Disposition: 03-Skilled Nursing Facility   Future Appointments Provider Department Dept Phone   09/05/2013 1:00 PM Mauri Brooklyn [facility] [phone]   09/05/2013 1:30 PM Lowella Dell, MD [facility] MEDICAL ONCOLOGY [phone]      Follow-up Information   Follow up with DUDA,MARCUS V, MD In 2 weeks.   Specialty:  Orthopedic Surgery   Contact information:   [address] [place] [address] [phone]        Signed: Wende Neighbors 08/02/2013, 10:55 AM

## 2013-08-02 NOTE — Progress Notes (Signed)
Patient ID: Autumn Chambers, female   DOB: 10/05/1941, 72 y.o.   MRN: [id number] No additional documentation for chronic kidney disease.

## 2013-08-02 NOTE — Clinical Social Work Psychosocial (Signed)
Clinical Social Work Department  BRIEF PSYCHOSOCIAL ASSESSMENT  Patient:Autumn Chambers  Account Number: [id number]  Admit date: 07/31/13 Clinical Social Worker Sabino Niemann, MSW Date/Time: 08/01/13 Referred by: Physician Date Referred: 07/31/13 Referred for   SNF Placement   Other Referral:  Interview type: Patient  Other interview type: PSYCHOSOCIAL DATA  Living Status: ALone Admitted from facility:  Level of care:  Primary support name: Eliezer Champagne Primary support relationship to patient: Brother Degree of support available:  Strong and vested  CURRENT CONCERNS  Current Concerns   Post-Acute Placement   Other Concerns:  SOCIAL WORK ASSESSMENT / PLAN  CSW met with pt re: PT recommendation for SNF.   Pt lives alone  CSW explained placement process and answered questions.   Pt reports [facility]  as her preference    CSW completed FL2 and initiated SNF search.     Assessment/plan status: Information/Referral to [facility]  Other assessment/ plan:  Information/referral to community resources:  SNF     PATIENT'S/FAMILY'S RESPONSE TO PLAN OF CARE:  Pt  reports she is agreeable to ST SNF in order to increase strength and independence with mobility prior to returning home  Pt verbalized understanding of placement process and appreciation for CSW assist.   Sabino Niemann, MSW [phone]

## 2013-08-02 NOTE — Progress Notes (Signed)
Subjective: 2 Days Post-Op Procedure(s) (LRB): LEFT TOTAL KNEE ARTHROPLASTY (Left) Patient reports pain as moderate.    Objective: Vital signs in last 24 hours: Temp:  [98.7 F (37.1 C)-99.9 F (37.7 C)] 99 F (37.2 C) (10/03 0626) Pulse Rate:  [88-108] 88 (10/03 0626) Resp:  [6-21] 16 (10/03 0626) BP: (106-134)/(40-77) 120/77 mmHg (10/03 0626) SpO2:  [93 %-98 %] 97 % (10/03 0626)  Intake/Output from previous day: 10/02 0701 - 10/03 0700 In: 80 [I.V.:80] Out: 1195 [Urine:1195] Intake/Output this shift:    No results found for this basename: HGB,  in the last 72 hours No results found for this basename: WBC, RBC, HCT, PLT,  in the last 72 hours No results found for this basename: NA, K, CL, CO2, BUN, CREATININE, GLUCOSE, CALCIUM,  in the last 72 hours No results found for this basename: LABPT, INR,  in the last 72 hours  Sensation intact distally Intact pulses distally Dorsiflexion/Plantar flexion intact Incision: scant drainage No cellulitis present compartments soft  Assessment/Plan: 2 Days Post-Op Procedure(s) (LRB): LEFT TOTAL KNEE ARTHROPLASTY (Left) Up with therapy Discharge to SNF in the next few days  BLACKMAN,CHRISTOPHER Y 08/02/2013, 7:04 AM

## 2013-08-02 NOTE — Progress Notes (Signed)
Physical Therapy Treatment Patient Details Name: Autumn Chambers MRN: [id number] DOB: September 21, 1941 Today's Date: 08/02/2013 Time: 4098-1191 PT Time Calculation (min): 23 min  PT Assessment / Plan / Recommendation  History of Present Illness L TKA   PT Comments   Pt. With very limited activity tolerance and only able to ambulate 10 feet this am.  Progressing with her therapeutic exercises.  Agree with need for SNF for rehab post acute.  Follow Up Recommendations  SNF     Does the patient have the potential to tolerate intense rehabilitation     Barriers to Discharge        Equipment Recommendations  None recommended by PT    Recommendations for Other Services OT consult  Frequency 7X/week   Progress towards PT Goals Progress towards PT goals: Progressing toward goals  Plan Current plan remains appropriate    Precautions / Restrictions Precautions Precautions: Fall;Knee Restrictions Weight Bearing Restrictions: Yes LLE Weight Bearing: Weight bearing as tolerated   Pertinent Vitals/Pain See vitals tab     Mobility  Bed Mobility Bed Mobility: Supine to Sit;Sitting - Scoot to Edge of Bed Supine to Sit: 4: Min assist;HOB elevated;With rails Sitting - Scoot to Edge of Bed: 5: Supervision Details for Bed Mobility Assistance: assist to bring LLE to EOB and min assist to elevate trunk from surface with cueing for sequence Transfers Transfers: Sit to Stand;Stand to Sit Sit to Stand: 4: Min assist;From bed;With upper extremity assist Stand to Sit: 4: Min assist;To chair/3-in-1;With upper extremity assist;With armrests Details for Transfer Assistance: cues for hand placement and technique; min assist to rise and to control descent when sitting in chair Ambulation/Gait Ambulation/Gait Assistance: 4: Min assist Ambulation Distance (Feet): 10 Feet Assistive device: Rolling walker Ambulation/Gait Assistance Details: cues for sequencing , RW placement, step length and to avoid  bending over RW and attmepting to WB through forearms. Gait Pattern: Step-to pattern;Antalgic Gait velocity: decreased    Exercises Total Joint Exercises Ankle Circles/Pumps: AROM;Both;10 reps;Seated Quad Sets: AROM;Left;10 reps;Seated Short Arc Quad: AAROM;Left;5 reps;Seated Hip ABduction/ADduction: AAROM;Left;10 reps;Seated Knee Flexion: AAROM;Left;5 reps;Seated Goniometric ROM: 0-60   PT Diagnosis:    PT Problem List:   PT Treatment Interventions:     PT Goals (current goals can now be found in the care plan section)    Visit Information  Last PT Received On: 08/02/13 Assistance Needed: +1 History of Present Illness: L TKA    Subjective Data  Subjective: "It hurts"   Cognition  Cognition Arousal/Alertness: Awake/alert Behavior During Therapy: WFL for tasks assessed/performed Overall Cognitive Status: Within Functional Limits for tasks assessed    Balance     End of Session PT - End of Session Equipment Utilized During Treatment: Gait belt Activity Tolerance: Patient tolerated treatment well;Patient limited by fatigue Patient left: in chair;with call bell/phone within reach Nurse Communication: Mobility status;Patient requests pain meds   GP     Ferman Hamming 08/02/2013, 9:00 AM Weldon Picking PT Acute Rehab Services [phone] Beeper [phone]

## 2013-08-02 NOTE — Clinical Social Work Placement (Addendum)
Clinical Social Work Department  CLINICAL SOCIAL WORK PLACEMENT NOTE   Patient: Autumn Chambers Account Number: [id number] Admit date: 07/31/13 Clinical Social Worker: Sabino Niemann LCSWA Date/time: 08/01/13 11:00 AM Clinical Social Work is seeking post-discharge placement for this patient at the following level of care: SKILLED NURSING (*CSW will update this form in Epic as items are completed)   Patient/family provided with [facility] Department of Clinical Social Work's list of facilities offering this level of care within the geographic area requested by the patient (or if unable, by the patient's family).  08/01/13  Patient/family informed of their freedom to choose among providers that offer the needed level of care, that participate in Medicare, Medicaid or managed care program needed by the patient, have an available bed and are willing to accept the patient.  08/01/13  Patient/family informed of [facility]' ownership interest in [facility], as well as of the fact that they are under no obligation to receive care at this facility.  PASARR submitted to EDS on  Pre-exisitng PASARR number received from EDS on  FL2 transmitted to all facilities in geographic area requested by pt/family on 08/01/13 FL2 transmitted to all facilities within larger geographic area on  Patient informed that his/her managed care company has contracts with or will negotiate with certain facilities, including the following:  Patient/family informed of bed offers received: 08/01/13 Patient chooses bed at [facility] Physician recommends and patient chooses bed at  Patient to be transferred to [facility] on 08/03/13- Jetta Lout, LCSWA  Patient to be transferred to facility by [facility] Non-emergent EMS- Jetta Lout, LCSWA   The following physician request were entered in Epic:  Additional Comments:

## 2013-08-02 NOTE — Progress Notes (Signed)
Physical Therapy Treatment Name: Autumn Chambers MRN: [id number] DOB: [birth year] Today's Date: 08/02/2013 Time: 4098-1191 PT Time Calculation (min): 10 min  PT Assessment / Plan / Recommendation  History of Present Illness L TKA   PT Comments   Pt. Reports she is too fatigued to get back OOB this pm for gait training.  She was agreeable to in bed therapeutic exercises.  Her left quad is gradually gaining strength with exercise.  Follow Up Recommendations  SNF     Does the patient have the potential to tolerate intense rehabilitation     Barriers to Discharge        Equipment Recommendations  None recommended by PT    Recommendations for Other Services OT consult  Frequency 7X/week   Progress towards PT Goals Progress towards PT goals: Progressing toward goals  Plan Current plan remains appropriate    Precautions / Restrictions Precautions Precautions: Fall;Knee Restrictions Weight Bearing Restrictions: Yes LLE Weight Bearing: Weight bearing as tolerated   Pertinent Vitals/Pain See vitals tab     Mobility  Bed Mobility Bed Mobility: Not assessed (pt. politely declined to get OOB this pm) Transfers Transfers: Not assessed Ambulation/Gait Ambulation/Gait Assistance: Not tested (comment)    Exercises Total Joint Exercises Ankle Circles/Pumps: AROM;Both;10 reps;Supine Quad Sets: AROM;Both;10 reps;Supine Gluteal Sets: AROM;Both;10 reps;Supine Short Arc Quad: AAROM;Left;10 reps;Supine Heel Slides: AAROM;Left;10 reps;Supine Hip ABduction/ADduction: AAROM;Left;10 reps;Supine   PT Diagnosis:    PT Problem List:   PT Treatment Interventions:     PT Goals (current goals can now be found in the care plan section) Acute Rehab PT Goals Patient Stated Goal: return home  Visit Information  Last PT Received On: 08/02/13 Assistance Needed: +1 History of Present Illness: L TKA    Subjective Data  Subjective: My tummy is rumbling Patient Stated Goal: return home    Cognition  Cognition Arousal/Alertness: Awake/alert Behavior During Therapy: WFL for tasks assessed/performed Overall Cognitive Status: Within Functional Limits for tasks assessed    Balance     End of Session PT - End of Session Activity Tolerance: Patient tolerated treatment well;Patient limited by fatigue Patient left: in bed;with call bell/phone within reach Nurse Communication: Mobility status   GP     Autumn Chambers 08/02/2013, 2:34 PM Weldon Picking PT Acute Rehab Services [phone] Beeper [phone]

## 2013-08-03 DIAGNOSIS — R279 Unspecified lack of coordination: Secondary | ICD-10-CM | POA: Diagnosis not present

## 2013-08-03 DIAGNOSIS — I129 Hypertensive chronic kidney disease with stage 1 through stage 4 chronic kidney disease, or unspecified chronic kidney disease: Secondary | ICD-10-CM | POA: Diagnosis not present

## 2013-08-03 DIAGNOSIS — R269 Unspecified abnormalities of gait and mobility: Secondary | ICD-10-CM | POA: Diagnosis not present

## 2013-08-03 DIAGNOSIS — E039 Hypothyroidism, unspecified: Secondary | ICD-10-CM | POA: Diagnosis not present

## 2013-08-03 DIAGNOSIS — H9190 Unspecified hearing loss, unspecified ear: Secondary | ICD-10-CM | POA: Diagnosis not present

## 2013-08-03 DIAGNOSIS — K219 Gastro-esophageal reflux disease without esophagitis: Secondary | ICD-10-CM | POA: Diagnosis not present

## 2013-08-03 DIAGNOSIS — M810 Age-related osteoporosis without current pathological fracture: Secondary | ICD-10-CM | POA: Diagnosis not present

## 2013-08-03 DIAGNOSIS — Z79899 Other long term (current) drug therapy: Secondary | ICD-10-CM | POA: Diagnosis not present

## 2013-08-03 DIAGNOSIS — Z96659 Presence of unspecified artificial knee joint: Secondary | ICD-10-CM | POA: Diagnosis not present

## 2013-08-03 DIAGNOSIS — M25569 Pain in unspecified knee: Secondary | ICD-10-CM | POA: Diagnosis not present

## 2013-08-03 DIAGNOSIS — Z471 Aftercare following joint replacement surgery: Secondary | ICD-10-CM | POA: Diagnosis not present

## 2013-08-03 DIAGNOSIS — S8990XA Unspecified injury of unspecified lower leg, initial encounter: Secondary | ICD-10-CM | POA: Diagnosis not present

## 2013-08-03 DIAGNOSIS — F411 Generalized anxiety disorder: Secondary | ICD-10-CM | POA: Diagnosis not present

## 2013-08-03 DIAGNOSIS — M818 Other osteoporosis without current pathological fracture: Secondary | ICD-10-CM | POA: Diagnosis not present

## 2013-08-03 DIAGNOSIS — Z853 Personal history of malignant neoplasm of breast: Secondary | ICD-10-CM | POA: Diagnosis not present

## 2013-08-03 DIAGNOSIS — E1149 Type 2 diabetes mellitus with other diabetic neurological complication: Secondary | ICD-10-CM | POA: Diagnosis not present

## 2013-08-03 DIAGNOSIS — M1990 Unspecified osteoarthritis, unspecified site: Secondary | ICD-10-CM | POA: Diagnosis not present

## 2013-08-03 DIAGNOSIS — Z23 Encounter for immunization: Secondary | ICD-10-CM | POA: Diagnosis not present

## 2013-08-03 DIAGNOSIS — E119 Type 2 diabetes mellitus without complications: Secondary | ICD-10-CM | POA: Diagnosis not present

## 2013-08-03 DIAGNOSIS — I10 Essential (primary) hypertension: Secondary | ICD-10-CM | POA: Diagnosis not present

## 2013-08-03 DIAGNOSIS — M6281 Muscle weakness (generalized): Secondary | ICD-10-CM | POA: Diagnosis not present

## 2013-08-03 DIAGNOSIS — E1329 Other specified diabetes mellitus with other diabetic kidney complication: Secondary | ICD-10-CM | POA: Diagnosis not present

## 2013-08-03 DIAGNOSIS — C50919 Malignant neoplasm of unspecified site of unspecified female breast: Secondary | ICD-10-CM | POA: Diagnosis not present

## 2013-08-03 DIAGNOSIS — E785 Hyperlipidemia, unspecified: Secondary | ICD-10-CM | POA: Diagnosis not present

## 2013-08-03 DIAGNOSIS — M1710 Unilateral primary osteoarthritis, unspecified knee: Secondary | ICD-10-CM | POA: Diagnosis not present

## 2013-08-03 DIAGNOSIS — N189 Chronic kidney disease, unspecified: Secondary | ICD-10-CM | POA: Diagnosis not present

## 2013-08-03 DIAGNOSIS — Z87891 Personal history of nicotine dependence: Secondary | ICD-10-CM | POA: Diagnosis not present

## 2013-08-03 DIAGNOSIS — G4730 Sleep apnea, unspecified: Secondary | ICD-10-CM | POA: Diagnosis not present

## 2013-08-03 DIAGNOSIS — Z85828 Personal history of other malignant neoplasm of skin: Secondary | ICD-10-CM | POA: Diagnosis not present

## 2013-08-03 LAB — GLUCOSE, CAPILLARY: Glucose-Capillary: 117 mg/dL — ABNORMAL HIGH (ref 70–99)

## 2013-08-03 NOTE — Progress Notes (Signed)
Physical Therapy Treatment Patient Details Name: Autumn Chambers MRN: [id number] DOB: 04-Mar-1941 Today's Date: 08/03/2013 Time: 7829-5621 PT Time Calculation (min): 24 min  PT Assessment / Plan / Recommendation  History of Present Illness L TKA   PT Comments   Pt. Making gradual gains with mobility.  She needs lots of encouragement but verbalizes that she needs to push through to reach her independence.  Good improvement in ROM (currently 75 degrees).  Anticipate DC to SNF for rehab today.  Follow Up Recommendations  SNF     Does the patient have the potential to tolerate intense rehabilitation     Barriers to Discharge        Equipment Recommendations  None recommended by PT    Recommendations for Other Services    Frequency 7X/week   Progress towards PT Goals Progress towards PT goals: Progressing toward goals  Plan Current plan remains appropriate    Precautions / Restrictions Precautions Precautions: Fall;Knee Restrictions Weight Bearing Restrictions: Yes LLE Weight Bearing: Weight bearing as tolerated   Pertinent Vitals/Pain See vitals tab     Mobility  Bed Mobility Bed Mobility: Supine to Sit;Sitting - Scoot to Edge of Bed Supine to Sit: 4: Min assist;HOB elevated;With rails Sitting - Scoot to Edge of Bed: 5: Supervision Details for Bed Mobility Assistance: assist to manage LE, heavy reliance on rail Transfers Transfers: Sit to Stand;Stand to Sit Sit to Stand: 4: Min assist;From bed;With upper extremity assist Stand to Sit: 4: Min assist;To chair/3-in-1;With upper extremity assist;With armrests Details for Transfer Assistance: min assist needed especially to rise and to control descent Ambulation/Gait Ambulation/Gait Assistance: 4: Min assist Ambulation Distance (Feet): 40 Feet Assistive device: Rolling walker Ambulation/Gait Assistance Details: sequencing and technique cues.  Moving better today with less difficulty in advancing herself Gait Pattern:  Step-to pattern;Antalgic Gait velocity: decreased    Exercises Total Joint Exercises Ankle Circles/Pumps: AROM;Both;10 reps;Supine Quad Sets: AROM;Both;10 reps;Supine Gluteal Sets: AROM;Both;10 reps;Supine Short Arc Quad: AAROM;Left;10 reps;Seated Hip ABduction/ADduction: AAROM;Left;10 reps;Seated Knee Flexion: AAROM;Left;5 reps;Seated Goniometric ROM: 0-75   PT Diagnosis:    PT Problem List:   PT Treatment Interventions:     PT Goals (current goals can now be found in the care plan section)    Visit Information  Last PT Received On: 08/03/13 Assistance Needed: +1 History of Present Illness: L TKA    Subjective Data  Subjective: I know I have to do this (referring to therapy session)   Cognition  Cognition Arousal/Alertness: Awake/alert Behavior During Therapy: WFL for tasks assessed/performed Overall Cognitive Status: Within Functional Limits for tasks assessed    Balance     End of Session PT - End of Session Equipment Utilized During Treatment: Gait belt Activity Tolerance: Patient tolerated treatment well;Patient limited by fatigue Patient left: in chair;with call bell/phone within reach Nurse Communication: Mobility status   GP     Ferman Hamming 08/03/2013, 3:10 PM Weldon Picking PT Acute Rehab Services [phone] Beeper [phone]

## 2013-08-03 NOTE — Progress Notes (Signed)
Clinical Child psychotherapist (CSW) prepared D/C packet and arranged non-emergent EMS transport for patient to go to [facility]. Patient reported that she will call family and make them aware of above. Nursing is aware of above. Please reconsult if further social work needs arise. CSW signing off.   Jetta Lout, LCSWA Weekend CSW [phone]

## 2013-08-03 NOTE — Progress Notes (Signed)
Subjective: Pt stable - dressing dry   Objective: Vital signs in last 24 hours: Temp:  [97.6 F (36.4 C)-98.7 F (37.1 C)] 98.7 F (37.1 C) (10/04 0601) Pulse Rate:  [78-85] 78 (10/04 0601) Resp:  [16-20] 20 (10/04 0601) BP: (117-134)/(62-69) 134/62 mmHg (10/04 0601) SpO2:  [95 %-97 %] 97 % (10/04 0601)  Intake/Output from previous day: 10/03 0701 - 10/04 0700 In: 920 [P.O.:920] Out: 250 [Urine:250] Intake/Output this shift:    Exam:  Sensation intact distally Intact pulses distally Dorsiflexion/Plantar flexion intact  Labs: No results found for this basename: HGB,  in the last 72 hours No results found for this basename: WBC, RBC, HCT, PLT,  in the last 72 hours No results found for this basename: NA, K, CL, CO2, BUN, CREATININE, GLUCOSE, CALCIUM,  in the last 72 hours No results found for this basename: LABPT, INR,  in the last 72 hours  Assessment/Plan: Dc to snf today if possible   Autumn Chambers SCOTT 08/03/2013, 7:46 AM

## 2013-08-03 NOTE — Discharge Summary (Signed)
Physician Discharge Summary  Patient ID: Autumn Chambers MRN: [id number] DOB/AGE: Apr 03, 1941 72 y.o.  Admit date: 07/31/2013 Discharge date: 08/03/2013  Admission Diagnoses:  Principal Problem:   Osteoarthritis of left knee   Discharge Diagnoses:  Same  Surgeries: Procedure(s): LEFT TOTAL KNEE ARTHROPLASTY on 07/31/2013   Consultants:    Discharged Condition: Stable  Hospital Course: Autumn Chambers is an 72 y.o. female who was admitted 07/31/2013 with a chief complaint of eft knee pain, and found to have a diagnosis of Osteoarthritis of left knee.  They were brought to the operating room on 07/31/2013 and underwent the above named procedures.  She was ambulating in hall at time of dc to SNF  Antibiotics given:  Anti-infectives   Start     Dose/Rate Route Frequency Ordered Stop   07/31/13 1600  ceFAZolin (ANCEF) IVPB 2 g/50 mL premix     2 g 100 mL/hr over 30 Minutes Intravenous Every 6 hours 07/31/13 1547 07/31/13 2259   07/31/13 0600  ceFAZolin (ANCEF) IVPB 2 g/50 mL premix     2 g 100 mL/hr over 30 Minutes Intravenous On call to O.R. 07/30/13 1411 07/31/13 0824    .  Recent vital signs:  Filed Vitals:   08/03/13 0601  BP: 134/62  Pulse: 78  Temp: 98.7 F (37.1 C)  Resp: 20    Recent laboratory studies:  Results for orders placed during the hospital encounter of 07/31/13  GLUCOSE, CAPILLARY      Result Value Range   Glucose-Capillary 115 (*) 70 - 99 mg/dL  GLUCOSE, CAPILLARY      Result Value Range   Glucose-Capillary 129 (*) 70 - 99 mg/dL  GLUCOSE, CAPILLARY      Result Value Range   Glucose-Capillary 106 (*) 70 - 99 mg/dL  GLUCOSE, CAPILLARY      Result Value Range   Glucose-Capillary 84  70 - 99 mg/dL  GLUCOSE, CAPILLARY      Result Value Range   Glucose-Capillary 118 (*) 70 - 99 mg/dL  GLUCOSE, CAPILLARY      Result Value Range   Glucose-Capillary 121 (*) 70 - 99 mg/dL  GLUCOSE, CAPILLARY      Result Value Range   Glucose-Capillary 135 (*) 70  - 99 mg/dL  GLUCOSE, CAPILLARY      Result Value Range   Glucose-Capillary 184 (*) 70 - 99 mg/dL   Comment 1 Documented in Chart     Comment 2 Notify RN    GLUCOSE, CAPILLARY      Result Value Range   Glucose-Capillary 119 (*) 70 - 99 mg/dL   Comment 1 Documented in Chart     Comment 2 Notify RN    GLUCOSE, CAPILLARY      Result Value Range   Glucose-Capillary 124 (*) 70 - 99 mg/dL  GLUCOSE, CAPILLARY      Result Value Range   Glucose-Capillary 140 (*) 70 - 99 mg/dL    Discharge Medications:     Medication List         alendronate 70 MG tablet  Commonly known as:  FOSAMAX  Take 70 mg by mouth every 7 (seven) days. Take with a full glass of water on an empty stomach.     CALCIUM PO  Take 1,500 mg by mouth 2 (two) times daily.     celecoxib 200 MG capsule  Commonly known as:  CELEBREX  Take 200 mg by mouth daily.     cholecalciferol 1000 UNITS tablet  Commonly known  as:  VITAMIN D  Take 1,000 Units by mouth daily.     diazepam 2 MG tablet  Commonly known as:  VALIUM  Take 2 mg by mouth daily.     diazepam 2 MG tablet  Commonly known as:  VALIUM  Take 1 tablet (2 mg total) by mouth daily.     diphenhydramine-acetaminophen 25-500 MG Tabs  Commonly known as:  TYLENOL PM  Take 1 tablet by mouth at bedtime as needed. For sleep     esomeprazole 40 MG capsule  Commonly known as:  NEXIUM  Take 40 mg by mouth daily before breakfast.     Glucosamine 500 MG Caps  Take 1 capsule by mouth 3 (three) times daily.     irbesartan 300 MG tablet  Commonly known as:  AVAPRO  Take 300 mg by mouth at bedtime.     levothyroxine 125 MCG tablet  Commonly known as:  SYNTHROID, LEVOTHROID  Take 125 mcg by mouth daily before breakfast.     methocarbamol 500 MG tablet  Commonly known as:  ROBAXIN  Take 1 tablet (500 mg total) by mouth every 6 (six) hours as needed.     oxyCODONE-acetaminophen 5-325 MG per tablet  Commonly known as:  ROXICET  Take one tablet by mouth every  eight hours; Take one tablet by mouth every 4 hours as needed for pain     pioglitazone 30 MG tablet  Commonly known as:  ACTOS  Take 30 mg by mouth daily.     sitaGLIPtin 100 MG tablet  Commonly known as:  JANUVIA  Take 100 mg by mouth daily.     tamoxifen 20 MG tablet  Commonly known as:  NOLVADEX  Take 20 mg by mouth daily.     triamterene-hydrochlorothiazide 37.5-25 MG per tablet  Commonly known as:  MAXZIDE-25  Take 1 tablet by mouth daily.        Diagnostic Studies: Dg Chest 2 View  07/23/2013   CLINICAL DATA:  Preop for left knee surgery  EXAM: CHEST  2 VIEW  COMPARISON:  12/21/2011  FINDINGS: Cardiomediastinal silhouette is stable. Mild hyperinflation. Degenerative changes thoracic spine. No acute infiltrate or pleural effusion. No pulmonary edema.  IMPRESSION: No active cardiopulmonary disease.   Electronically Signed   By: Natasha Mead   On: 07/23/2013 15:47   Dg Knee 1-2 Views Left  07/31/2013   CLINICAL DATA:  Knee replacement  EXAM: LEFT KNEE - 1-2 VIEW  COMPARISON:  None.  FINDINGS: Total knee replacement. Prosthesis in satisfactory position alignment. No fracture or complication  IMPRESSION: Satisfactory total knee replacement.   Electronically Signed   By: Marlan Palau M.D.   On: 07/31/2013 13:15    Disposition: 03-Skilled Nursing Facility      Discharge Orders   Future Appointments Provider Department Dept Phone   09/05/2013 1:00 PM Mauri Brooklyn [facility] MEDICAL ONCOLOGY [phone]   09/05/2013 1:30 PM Lowella Dell, MD [facility] MEDICAL ONCOLOGY [phone]   Future Orders Complete By Expires   Call MD / Call 911  As directed    Comments:     If you experience chest pain or shortness of breath, CALL 911 and be transported to the hospital emergency room.  If you develope a fever above 101 F, pus (white drainage) or increased drainage or redness at the wound, or calf pain, call your surgeon's office.   Constipation  Prevention  As directed    Comments:  Drink plenty of fluids.  Prune juice may be helpful.  You may use a stool softener, such as Colace (over the counter) 100 mg twice a day.  Use MiraLax (over the counter) for constipation as needed.   Diet - low sodium heart healthy  As directed    Increase activity slowly as tolerated  As directed       Follow-up Information   Follow up with DUDA,MARCUS V, MD In 2 weeks.   Specialty:  Orthopedic Surgery   Contact information:   [address] [place] [address] [phone]        Signed: Cammy Copa 08/03/2013, 7:48 AM

## 2013-08-03 NOTE — Progress Notes (Signed)
Report given to [facility] nurse at this time

## 2013-08-05 ENCOUNTER — Non-Acute Institutional Stay (SKILLED_NURSING_FACILITY): Payer: Medicare Other | Admitting: Adult Health

## 2013-08-05 DIAGNOSIS — K219 Gastro-esophageal reflux disease without esophagitis: Secondary | ICD-10-CM | POA: Diagnosis not present

## 2013-08-05 DIAGNOSIS — M810 Age-related osteoporosis without current pathological fracture: Secondary | ICD-10-CM | POA: Diagnosis not present

## 2013-08-05 DIAGNOSIS — C50919 Malignant neoplasm of unspecified site of unspecified female breast: Secondary | ICD-10-CM

## 2013-08-05 DIAGNOSIS — F419 Anxiety disorder, unspecified: Secondary | ICD-10-CM

## 2013-08-05 DIAGNOSIS — E039 Hypothyroidism, unspecified: Secondary | ICD-10-CM | POA: Diagnosis not present

## 2013-08-05 DIAGNOSIS — I10 Essential (primary) hypertension: Secondary | ICD-10-CM

## 2013-08-05 DIAGNOSIS — E1329 Other specified diabetes mellitus with other diabetic kidney complication: Secondary | ICD-10-CM

## 2013-08-05 DIAGNOSIS — F411 Generalized anxiety disorder: Secondary | ICD-10-CM

## 2013-08-05 DIAGNOSIS — M1710 Unilateral primary osteoarthritis, unspecified knee: Secondary | ICD-10-CM

## 2013-08-05 DIAGNOSIS — M1712 Unilateral primary osteoarthritis, left knee: Secondary | ICD-10-CM

## 2013-08-05 DIAGNOSIS — E0829 Diabetes mellitus due to underlying condition with other diabetic kidney complication: Secondary | ICD-10-CM

## 2013-08-05 LAB — GLUCOSE, CAPILLARY: Glucose-Capillary: 146 mg/dL — ABNORMAL HIGH (ref 70–99)

## 2013-08-07 ENCOUNTER — Non-Acute Institutional Stay (SKILLED_NURSING_FACILITY): Payer: Medicare Other | Admitting: Internal Medicine

## 2013-08-07 DIAGNOSIS — I10 Essential (primary) hypertension: Secondary | ICD-10-CM

## 2013-08-07 DIAGNOSIS — E039 Hypothyroidism, unspecified: Secondary | ICD-10-CM

## 2013-08-07 DIAGNOSIS — IMO0002 Reserved for concepts with insufficient information to code with codable children: Secondary | ICD-10-CM

## 2013-08-07 DIAGNOSIS — E1329 Other specified diabetes mellitus with other diabetic kidney complication: Secondary | ICD-10-CM

## 2013-08-07 DIAGNOSIS — M1710 Unilateral primary osteoarthritis, unspecified knee: Secondary | ICD-10-CM

## 2013-08-07 DIAGNOSIS — M1712 Unilateral primary osteoarthritis, left knee: Secondary | ICD-10-CM

## 2013-08-07 DIAGNOSIS — E0829 Diabetes mellitus due to underlying condition with other diabetic kidney complication: Secondary | ICD-10-CM

## 2013-08-15 ENCOUNTER — Non-Acute Institutional Stay (SKILLED_NURSING_FACILITY): Payer: Medicare Other | Admitting: Adult Health

## 2013-08-15 DIAGNOSIS — M1710 Unilateral primary osteoarthritis, unspecified knee: Secondary | ICD-10-CM | POA: Diagnosis not present

## 2013-08-15 DIAGNOSIS — F411 Generalized anxiety disorder: Secondary | ICD-10-CM

## 2013-08-15 DIAGNOSIS — E0829 Diabetes mellitus due to underlying condition with other diabetic kidney complication: Secondary | ICD-10-CM

## 2013-08-15 DIAGNOSIS — K219 Gastro-esophageal reflux disease without esophagitis: Secondary | ICD-10-CM

## 2013-08-15 DIAGNOSIS — M810 Age-related osteoporosis without current pathological fracture: Secondary | ICD-10-CM

## 2013-08-15 DIAGNOSIS — C50919 Malignant neoplasm of unspecified site of unspecified female breast: Secondary | ICD-10-CM | POA: Diagnosis not present

## 2013-08-15 DIAGNOSIS — F419 Anxiety disorder, unspecified: Secondary | ICD-10-CM

## 2013-08-15 DIAGNOSIS — I10 Essential (primary) hypertension: Secondary | ICD-10-CM

## 2013-08-15 DIAGNOSIS — M1712 Unilateral primary osteoarthritis, left knee: Secondary | ICD-10-CM

## 2013-08-15 DIAGNOSIS — E1329 Other specified diabetes mellitus with other diabetic kidney complication: Secondary | ICD-10-CM

## 2013-08-15 DIAGNOSIS — E039 Hypothyroidism, unspecified: Secondary | ICD-10-CM

## 2013-08-22 DIAGNOSIS — M810 Age-related osteoporosis without current pathological fracture: Secondary | ICD-10-CM | POA: Insufficient documentation

## 2013-08-22 DIAGNOSIS — K219 Gastro-esophageal reflux disease without esophagitis: Secondary | ICD-10-CM | POA: Insufficient documentation

## 2013-08-22 DIAGNOSIS — E0829 Diabetes mellitus due to underlying condition with other diabetic kidney complication: Secondary | ICD-10-CM | POA: Insufficient documentation

## 2013-08-22 DIAGNOSIS — F419 Anxiety disorder, unspecified: Secondary | ICD-10-CM | POA: Insufficient documentation

## 2013-08-22 DIAGNOSIS — E039 Hypothyroidism, unspecified: Secondary | ICD-10-CM | POA: Insufficient documentation

## 2013-08-22 DIAGNOSIS — I10 Essential (primary) hypertension: Secondary | ICD-10-CM | POA: Insufficient documentation

## 2013-08-22 NOTE — Progress Notes (Signed)
Patient ID: Autumn Chambers, female   DOB: Mar 24, 1941, 72 y.o.   MRN: [id number]       PROGRESS NOTE  DATE: 08/05/2013  FACILITY: Nursing Home Location: [facility]  LEVEL OF CARE: SNF (31)  Acute Visit  CHIEF COMPLAINT:  Follow-up hospitalization  HISTORY OF PRESENT ILLNESS: This is a 72 year old female who has been admitted to [facility] on 08/03/13 from [facility] with Osteoarthritis S/P left total knee arthroplasty. She has been admitted for a short-term rehabilitation.  REASSESSMENT OF ONGOING PROBLEM(S):  HTN: Pt 's HTN remains stable.  Denies CP, sob, DOE, pedal edema, headaches, dizziness or visual disturbances.  No complications from the medications currently being used.  Last BP : 118/60  OSTEOPOROSIS: The patient's osteoporosis remains stable. The patient denies recent worsening of pain and the range of motion remains stable. There has not been any evidence of fractures. No complications noted from the medications presently being used.  GERD: pt's GERD is stable.  Denies ongoing heartburn, abd. Pain, nausea or vomiting.  Currently on a PPI & tolerates it without any adverse reactions.  PAST MEDICAL HISTORY : Reviewed.  No changes.  CURRENT MEDICATIONS: Reviewed per [facility]  REVIEW OF SYSTEMS:  GENERAL: no change in appetite, no fatigue, no weight changes, no fever, chills or weakness RESPIRATORY: no cough, SOB, DOE, wheezing, hemoptysis CARDIAC: no chest pain, edema or palpitations GI: no abdominal pain, diarrhea, constipation, heart burn, nausea or vomiting  PHYSICAL EXAMINATION  VS:  T97       P80      RR20      BP118/60     POX96 %     WT260.4 (Lb)  GENERAL: no acute distress, normal body habitus EYES: conjunctivae normal, sclerae normal, normal eye lids NECK: supple, trachea midline, no neck masses, no thyroid tenderness, no thyromegaly LYMPHATICS: no LAN in the neck, no supraclavicular LAN RESPIRATORY: breathing is even & unlabored,  BS CTAB CARDIAC: RRR, no murmur,no extra heart sounds, no edema GI: abdomen soft, normal BS, no masses, no tenderness, no hepatomegaly, no splenomegaly PSYCHIATRIC: the patient is alert & oriented to person, affect & behavior appropriate  LABS/RADIOLOGY: 07/23/13 WBC 5.7 hemoglobin 10.9 hematocrit 32.9 sodium 136 potassium 5.0 glucose 104 BUN 26 creatinine 1.53 calcium 9.5  ASSESSMENT/PLAN:  Osteoarthritis status post left total knee arthroplasty - for rehabilitation  Osteoporosis - continue Fosamax and calcium  Hypertension - well-controlled; continue Maxzide and Avapro  Hypothyroidism - continue Synthroid  GERD - continue Nexium  Breast cancer, history - continue tamoxifen  Diabetes mellitus, type 2 - continue Actos and Januvia  Anxiety - continue Valium     CPT CODE: 08657

## 2013-08-22 NOTE — Progress Notes (Signed)
Patient ID: Autumn Chambers, female   DOB: 25-Oct-1941, 72 y.o.   MRN: [id number]       PROGRESS NOTE  DATE: 08/15/2013   FACILITY: [facility]  LEVEL OF CARE: SNF (31)  Acute Visit  CHIEF COMPLAINT:  Follow-up hospitalization  HISTORY OF PRESENT ILLNESS: This is a 72 year old female who is for discharge home with Home health PT for endurance and strengthening/ROM and OT for ADLs and safety. She has been admitted to [facility] on 08/03/13 from [facility] with Osteoarthritis S/P left total knee arthroplasty. Patient was admitted to this facility for short-term rehabilitation after the patient's recent hospitalization.  Patient has completed SNF rehabilitation and therapy has cleared the patient for discharge.  Reassessment of ongoing problem(s):  HTN: Pt 's HTN remains stable.  Denies CP, sob, DOE, pedal edema, headaches, dizziness or visual disturbances.  No complications from the medications currently being used.  Last BP : 112/57  DM:pt's DM remains stable.  Pt denies polyuria, polydipsia, polyphagia, changes in vision or hypoglycemic episodes.  No complications noted from the medication presently being used.   HYPOTHYROIDISM: The hypothyroidism remains stable. No complications noted from the medications presently being used.  The patient denies fatigue or constipation.   PAST MEDICAL HISTORY : Reviewed.  No changes.  CURRENT MEDICATIONS: Reviewed per [facility]  REVIEW OF SYSTEMS:  GENERAL: no change in appetite, no fatigue, no weight changes, no fever, chills or weakness RESPIRATORY: no cough, SOB, DOE, wheezing, hemoptysis CARDIAC: no chest pain, edema or palpitations GI: no abdominal pain, diarrhea, constipation, heart burn, nausea or vomiting  PHYSICAL EXAMINATION  VS:  T97.6       P81       RR16      BP112/57      POX95 %       WT266.6 (Lb)  GENERAL: no acute distress, normal body habitus NECK: supple, trachea midline, no neck masses, no thyroid  tenderness, no thyromegaly LYMPHATICS: no LAN in the neck, no supraclavicular LAN RESPIRATORY: breathing is even & unlabored, BS CTAB CARDIAC: RRR, no murmur,no extra heart sounds, no edema GI: abdomen soft, normal BS, no masses, no tenderness, no hepatomegaly, no splenomegaly PSYCHIATRIC: the patient is alert & oriented to person, affect & behavior appropriate  LABS/RADIOLOGY: 08/06/13 WBC 6.4 hemoglobin 8.1 hematocrit 26.7 sodium 134 potassium 4.7 pulse 155 BUN 25 creatinine 1.4 calcium 8.3 07/23/13 WBC 5.7 hemoglobin 10.9 hematocrit 32.9 sodium 136 potassium 5.0 glucose 104 BUN 26 creatinine 1.53 calcium 9.5  ASSESSMENT/PLAN:  Osteoarthritis status post left total knee arthroplasty - for Home health PT and OT  Osteoporosis - continue Fosamax and calcium  Hypertension - well-controlled; continue Maxzide and Losartan (recently discontinued Avapro)  Hypothyroidism - continue Synthroid  GERD - recently changed Nexium to Omeprazole  Breast cancer, history - continue tamoxifen  Diabetes mellitus, type 2 - continue Actos and Januvia  Anxiety - continue Valium    I have filled out patient's discharge paperwork and written prescriptions.  Patient will receive home health PT and OT.   Total discharge time: Greater than 30 minutes Discharge time involved coordination of the discharge process with social worker, nursing staff and therapy department. Medical justification for home health services verified.  CPT CODE: 40981

## 2013-08-24 DIAGNOSIS — Z96659 Presence of unspecified artificial knee joint: Secondary | ICD-10-CM | POA: Diagnosis not present

## 2013-08-24 DIAGNOSIS — I10 Essential (primary) hypertension: Secondary | ICD-10-CM | POA: Diagnosis not present

## 2013-08-24 DIAGNOSIS — Z5189 Encounter for other specified aftercare: Secondary | ICD-10-CM | POA: Diagnosis not present

## 2013-08-24 DIAGNOSIS — E119 Type 2 diabetes mellitus without complications: Secondary | ICD-10-CM | POA: Diagnosis not present

## 2013-08-24 DIAGNOSIS — Z471 Aftercare following joint replacement surgery: Secondary | ICD-10-CM | POA: Diagnosis not present

## 2013-08-26 DIAGNOSIS — E119 Type 2 diabetes mellitus without complications: Secondary | ICD-10-CM | POA: Diagnosis not present

## 2013-08-26 DIAGNOSIS — Z471 Aftercare following joint replacement surgery: Secondary | ICD-10-CM | POA: Diagnosis not present

## 2013-08-26 DIAGNOSIS — I10 Essential (primary) hypertension: Secondary | ICD-10-CM | POA: Diagnosis not present

## 2013-08-26 DIAGNOSIS — Z5189 Encounter for other specified aftercare: Secondary | ICD-10-CM | POA: Diagnosis not present

## 2013-08-26 DIAGNOSIS — Z96659 Presence of unspecified artificial knee joint: Secondary | ICD-10-CM | POA: Diagnosis not present

## 2013-08-28 DIAGNOSIS — E119 Type 2 diabetes mellitus without complications: Secondary | ICD-10-CM | POA: Diagnosis not present

## 2013-08-28 DIAGNOSIS — I10 Essential (primary) hypertension: Secondary | ICD-10-CM | POA: Diagnosis not present

## 2013-08-28 DIAGNOSIS — Z96659 Presence of unspecified artificial knee joint: Secondary | ICD-10-CM | POA: Diagnosis not present

## 2013-08-28 DIAGNOSIS — Z471 Aftercare following joint replacement surgery: Secondary | ICD-10-CM | POA: Diagnosis not present

## 2013-08-28 DIAGNOSIS — Z5189 Encounter for other specified aftercare: Secondary | ICD-10-CM | POA: Diagnosis not present

## 2013-08-29 DIAGNOSIS — G4733 Obstructive sleep apnea (adult) (pediatric): Secondary | ICD-10-CM | POA: Diagnosis not present

## 2013-08-30 DIAGNOSIS — Z471 Aftercare following joint replacement surgery: Secondary | ICD-10-CM | POA: Diagnosis not present

## 2013-08-30 DIAGNOSIS — Z5189 Encounter for other specified aftercare: Secondary | ICD-10-CM | POA: Diagnosis not present

## 2013-08-30 DIAGNOSIS — E119 Type 2 diabetes mellitus without complications: Secondary | ICD-10-CM | POA: Diagnosis not present

## 2013-08-30 DIAGNOSIS — Z96659 Presence of unspecified artificial knee joint: Secondary | ICD-10-CM | POA: Diagnosis not present

## 2013-08-30 DIAGNOSIS — I10 Essential (primary) hypertension: Secondary | ICD-10-CM | POA: Diagnosis not present

## 2013-09-02 DIAGNOSIS — N189 Chronic kidney disease, unspecified: Secondary | ICD-10-CM | POA: Diagnosis not present

## 2013-09-02 DIAGNOSIS — D631 Anemia in chronic kidney disease: Secondary | ICD-10-CM | POA: Diagnosis not present

## 2013-09-02 DIAGNOSIS — Z471 Aftercare following joint replacement surgery: Secondary | ICD-10-CM | POA: Diagnosis not present

## 2013-09-02 DIAGNOSIS — I10 Essential (primary) hypertension: Secondary | ICD-10-CM | POA: Diagnosis not present

## 2013-09-02 DIAGNOSIS — Z96659 Presence of unspecified artificial knee joint: Secondary | ICD-10-CM | POA: Diagnosis not present

## 2013-09-02 DIAGNOSIS — E119 Type 2 diabetes mellitus without complications: Secondary | ICD-10-CM | POA: Diagnosis not present

## 2013-09-02 DIAGNOSIS — I129 Hypertensive chronic kidney disease with stage 1 through stage 4 chronic kidney disease, or unspecified chronic kidney disease: Secondary | ICD-10-CM | POA: Diagnosis not present

## 2013-09-02 DIAGNOSIS — N2581 Secondary hyperparathyroidism of renal origin: Secondary | ICD-10-CM | POA: Diagnosis not present

## 2013-09-02 DIAGNOSIS — Z5189 Encounter for other specified aftercare: Secondary | ICD-10-CM | POA: Diagnosis not present

## 2013-09-03 NOTE — Progress Notes (Signed)
Patient ID: Autumn Chambers, female   DOB: 06/26/1941, 72 y.o.   MRN: [id number]        HISTORY & PHYSICAL  DATE: 08/07/2013   FACILITY: [facility]  LEVEL OF CARE: SNF (31)  ALLERGIES:  Allergies  Allergen Reactions  . Darvon Other (See Comments)    Gets her "high"    CHIEF COMPLAINT:  Manage left knee osteoarthritis, hypothyroidism, and diabetes mellitus.    HISTORY OF PRESENT ILLNESS:  The patient is a 72 year-old, Caucasian female.    KNEE OSTEOARTHRITIS: Patient had a history of pain and functional disability in the knee due to end-stage osteoarthritis and has failed nonsurgical conservative treatments. Patient had worsening of pain with activity and weight bearing, pain that interfered with activities of daily living & pain with passive range of motion. Therefore patient underwent total knee arthroplasty and tolerated the procedure well. Patient is admitted to this facility for sort short-term rehabilitation. Patient denies knee pain.    DM:pt's DM remains stable.  Pt denies polyuria, polydipsia, polyphagia, changes in vision or hypoglycemic episodes.  No complications noted from the medication presently being used.  Last hemoglobin A1c is:  Not available.    HYPOTHYROIDISM: The hypothyroidism remains stable. No complications noted from the medications presently being used.  The patient denies fatigue or constipation.  Last TSH:  Not available.      PAST MEDICAL HISTORY :  Past Medical History  Diagnosis Date  . Hypertension   . Hyperlipidemia   . Shortness of breath     "mild upon exertion"  . Diabetes mellitus     oral  . Hypothyroidism   . Anemia   . Urinary tract infection     hx of  . H/O hiatal hernia   . Irritable bowel   . Cancer     hx of skin ca  . Arthritis   . Osteoporosis 02/2013    T score -2.9  . PONV (postoperative nausea and vomiting)     yearsago  . Hearing loss   . Chronic kidney disease     CRI- sees Dr Allena Katz    PAST  SURGICAL HISTORY: Past Surgical History  Procedure Laterality Date  . Skin cancer excision      approx 4 years ago.  Nose  . Achilles tendon surgery    . Eye surgery      bilateral cataract surgery  . Tonsillectomy    . Dilation and curettage of uterus    . Breast lumpectomy      left, with 2 nodes removed  . Total knee arthroplasty  01/04/2012    Procedure: TOTAL KNEE ARTHROPLASTY;  Surgeon: Nadara Mustard, MD;  Location: [facility] OR;  Service: Orthopedics;  Laterality: Right;  Right Total Knee Arthroplasty  . Total knee arthroplasty Left 07/31/2013    Procedure: LEFT TOTAL KNEE ARTHROPLASTY;  Surgeon: Nadara Mustard, MD;  Location: [facility] OR;  Service: Orthopedics;  Laterality: Left;  Left Total Knee Arthroplasty    SOCIAL HISTORY:  reports that she has quit smoking. Her smoking use included Cigarettes. She smoked 0.00 packs per day for 40 years. She has never used smokeless tobacco. She reports that she does not drink alcohol or use illicit drugs.  FAMILY HISTORY:  Family History  Problem Relation Age of Onset  . Cancer Father     Prostate  . Cancer Sister     Pancreatic cancer  . Cancer Brother     Brain cancer  .  Hypertension Brother     CURRENT MEDICATIONS: Reviewed per [facility]  REVIEW OF SYSTEMS:  See HPI otherwise 14 point ROS is negative.  PHYSICAL EXAMINATION  VS:  T 97.8       P 71      RR 20      BP 126/74      POX 99%        WT (Lb)  GENERAL: no acute distress, morbidly obese body habitus EYES: conjunctivae normal, sclerae normal, normal eye lids MOUTH/THROAT: lips without lesions,no lesions in the mouth,tongue is without lesions,uvula elevates in midline NECK: supple, trachea midline, no neck masses, no thyroid tenderness, no thyromegaly LYMPHATICS: no LAN in the neck, no supraclavicular LAN RESPIRATORY: breathing is even & unlabored, BS CTAB CARDIAC: RRR, no murmur,no extra heart sounds   EDEMA/VARICOSITIES:  +2 bilateral lower extremity edema  ARTERIAL:  pedal pulses +1  bilaterally    GI:  ABDOMEN: abdomen soft, normal BS, no masses, no tenderness  LIVER/SPLEEN: no hepatomegaly, no splenomegaly MUSCULOSKELETAL: HEAD: normal to inspection & palpation BACK: no kyphosis, scoliosis or spinal processes tenderness EXTREMITIES: LEFT UPPER EXTREMITY: full range of motion, normal strength & tone RIGHT UPPER EXTREMITY:  full range of motion, normal strength & tone LEFT LOWER EXTREMITY: strength intact, range of motion not tested due to surgery   RIGHT LOWER EXTREMITY:  full range of motion, normal strength & tone PSYCHIATRIC: the patient is alert & oriented to person, affect & behavior appropriate  LABS/RADIOLOGY:  None received from hospitalization.     Chest x-ray:  No acute disease.    Left knee x-ray:  Showed satisfactory total knee replacement.    ASSESSMENT/PLAN:  Left knee osteoarthritis.  Status post left total knee arthroplasty.  Continue rehabilitation.    Hypothyroidism.  Continue levothyroxine.    Diabetes mellitus.  Continue current medications.    Hypertension.  Well controlled.    History of breast cancer.  Continue tamoxifen.    Check CBC and BMP.    I have reviewed patient's medical records received at admission/from hospitalization.  CPT CODE: 16109

## 2013-09-04 ENCOUNTER — Other Ambulatory Visit: Payer: Self-pay | Admitting: Physician Assistant

## 2013-09-04 DIAGNOSIS — Z5189 Encounter for other specified aftercare: Secondary | ICD-10-CM | POA: Diagnosis not present

## 2013-09-04 DIAGNOSIS — Z471 Aftercare following joint replacement surgery: Secondary | ICD-10-CM | POA: Diagnosis not present

## 2013-09-04 DIAGNOSIS — E119 Type 2 diabetes mellitus without complications: Secondary | ICD-10-CM | POA: Diagnosis not present

## 2013-09-04 DIAGNOSIS — C50919 Malignant neoplasm of unspecified site of unspecified female breast: Secondary | ICD-10-CM

## 2013-09-04 DIAGNOSIS — I10 Essential (primary) hypertension: Secondary | ICD-10-CM | POA: Diagnosis not present

## 2013-09-04 DIAGNOSIS — Z96659 Presence of unspecified artificial knee joint: Secondary | ICD-10-CM | POA: Diagnosis not present

## 2013-09-05 ENCOUNTER — Ambulatory Visit (HOSPITAL_BASED_OUTPATIENT_CLINIC_OR_DEPARTMENT_OTHER): Payer: Medicare Other | Admitting: Oncology

## 2013-09-05 ENCOUNTER — Encounter: Payer: Self-pay | Admitting: *Deleted

## 2013-09-05 ENCOUNTER — Other Ambulatory Visit (HOSPITAL_BASED_OUTPATIENT_CLINIC_OR_DEPARTMENT_OTHER): Payer: Medicare Other | Admitting: Lab

## 2013-09-05 VITALS — BP 130/77 | HR 88 | Temp 97.6°F | Resp 18 | Ht 68.0 in | Wt 249.7 lb

## 2013-09-05 DIAGNOSIS — D631 Anemia in chronic kidney disease: Secondary | ICD-10-CM | POA: Diagnosis not present

## 2013-09-05 DIAGNOSIS — N189 Chronic kidney disease, unspecified: Secondary | ICD-10-CM

## 2013-09-05 DIAGNOSIS — Z853 Personal history of malignant neoplasm of breast: Secondary | ICD-10-CM | POA: Diagnosis not present

## 2013-09-05 DIAGNOSIS — C50919 Malignant neoplasm of unspecified site of unspecified female breast: Secondary | ICD-10-CM

## 2013-09-05 DIAGNOSIS — C50212 Malignant neoplasm of upper-inner quadrant of left female breast: Secondary | ICD-10-CM

## 2013-09-05 LAB — CBC WITH DIFFERENTIAL/PLATELET
Basophils Absolute: 0 10*3/uL (ref 0.0–0.1)
EOS%: 3.4 % (ref 0.0–7.0)
Eosinophils Absolute: 0.2 10*3/uL (ref 0.0–0.5)
HCT: 31.4 % — ABNORMAL LOW (ref 34.8–46.6)
HGB: 10.1 g/dL — ABNORMAL LOW (ref 11.6–15.9)
MCH: 29.8 pg (ref 25.1–34.0)
MONO#: 0.6 10*3/uL (ref 0.1–0.9)
NEUT%: 60.6 % (ref 38.4–76.8)
Platelets: 181 10*3/uL (ref 145–400)
RDW: 15 % — ABNORMAL HIGH (ref 11.2–14.5)
WBC: 5.4 10*3/uL (ref 3.9–10.3)
lymph#: 1.3 10*3/uL (ref 0.9–3.3)

## 2013-09-05 LAB — COMPREHENSIVE METABOLIC PANEL (CC13)
Anion Gap: 10 mEq/L (ref 3–11)
BUN: 23.2 mg/dL (ref 7.0–26.0)
CO2: 18 mEq/L — ABNORMAL LOW (ref 22–29)
Calcium: 9.3 mg/dL (ref 8.4–10.4)
Chloride: 112 mEq/L — ABNORMAL HIGH (ref 98–109)
Creatinine: 1.5 mg/dL — ABNORMAL HIGH (ref 0.6–1.1)
Glucose: 133 mg/dl (ref 70–140)

## 2013-09-05 NOTE — Progress Notes (Signed)
[facility]  [phone] [phone]  OFFICE PROGRESS NOTE   ID: Autumn Chambers   DOB: 1941/07/04  MR#: [id number]  MVH#:[id number]   PCP: Levonne Lapping, NP-C GYN: Nadyne Coombes.  Audie Box, MD SU: Mardene Celeste.  Lurene Shadow MD OTHER MD:  Duke Salvia.  Eliott Nine, MD   HISTORY OF PRESENT ILLNESS: From Dr. Theron Arista Rubin's New Patient Evaluation Note dated 07/09/2008:  "This is a delightful 72 year old woman from [facility] referred by Dr. Lurene Shadow for evaluation and treatment of breast cancer.   This woman has been in fairly good health.  She has undergone annual screening mammography.  She had a follow-up mammogram after a mammogram in December 2008 on 04/17/2008, which at the time showed an architectural distortion at 10 o'clock position, left breast.  At the time, there was a palpable grape-sized lump 10 o'clock position 7 cm from the nipple.  Ultrasound of the area showed a hypoechoic area measuring 1.4 x 1.2 x 1.3 cm.  An associated BSGI scan that was done on 04/29/2008 showed abnormal activity in the left breast in a similar position measuring an area of 1.6 cm.  A biopsy was recommended, which took place on 04/29/2008.  This showed an intermediate to high-grade ductal cancer.  Definitive lymphovascular invasion was not identified.  Prognostic panel indicated that this was ER and PR positive at 83% and 99% respectively.  Proliferative index 15%, HER-2 was 1+.  Patient elected to undergo a lumpectomy and sentinel lymph node evaluation on 05/29/2008.  Final pathology showed a 1.3 cm grade 2/3 invasive lobular cancer.  Lymphovascular invasion was present.  Margins were negative.  Two sentinel lymph nodes were negative for tumor.  The patient has had an unremarkable postoperative course."  Her subsequent history is as detailed below.   INTERVAL HISTORY: Mahkayla returns today for followup of her breast cancer. Interval history is significant for having had her knee surgery, which went  well. She still undergoing physical therapy. She uses a walker most of the time feels very steady on her feet and does not think she will needed chronically.  REVIEW OF SYSTEMS: She denies unusual headaches, visual changes, cough, phlegm production, or pleurisy. There is no chest pain or pressure. There has been no change in bowel or bladder habits. She has problems hearing and her hearing aid doesn't work very well. She has pain at the recent surgery site, but it is mild. She has been taken off Celebrex because of the renal insufficiency. This is contributing to her pain, of course. Otherwise a detailed review of systems today was noncontributory   PAST MEDICAL HISTORY: Past Medical History  Diagnosis Date  . Hypertension   . Hyperlipidemia   . Shortness of breath     "mild upon exertion"  . Diabetes mellitus     oral  . Hypothyroidism   . Anemia   . Urinary tract infection     hx of  . H/O hiatal hernia   . Irritable bowel   . Cancer     hx of skin ca  . Arthritis   . Osteoporosis 02/2013    T score -2.9  . PONV (postoperative nausea and vomiting)     yearsago  . Hearing loss   . Chronic kidney disease     CRI- sees Dr Allena Katz    PAST SURGICAL HISTORY: Past Surgical History  Procedure Laterality Date  . Skin cancer excision      approx 4 years ago.  Nose  .  Achilles tendon surgery    . Eye surgery      bilateral cataract surgery  . Tonsillectomy    . Dilation and curettage of uterus    . Breast lumpectomy      left, with 2 nodes removed  . Total knee arthroplasty  01/04/2012    Procedure: TOTAL KNEE ARTHROPLASTY;  Surgeon: Nadara Mustard, MD;  Location: [facility] OR;  Service: Orthopedics;  Laterality: Right;  Right Total Knee Arthroplasty  . Total knee arthroplasty Left 07/31/2013    Procedure: LEFT TOTAL KNEE ARTHROPLASTY;  Surgeon: Nadara Mustard, MD;  Location: [facility] OR;  Service: Orthopedics;  Laterality: Left;  Left Total Knee Arthroplasty    FAMILY HISTORY Family History   Problem Relation Age of Onset  . Cancer Father     Prostate  . Cancer Sister     Pancreatic cancer  . Cancer Brother     Brain cancer  . Hypertension Brother     GYNECOLOGIC HISTORY: The patient is a G0, P0 female.  Age 69 at menarche, age 73 at menopause.  The patient was on hormone replacement for six years.    SOCIAL HISTORY: She is single.  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  She is a lifelong resident of [place], [place].  She enjoys playing with her cats and doing yard work in her spare time.   ADVANCED DIRECTIVES: In place  HEALTH MAINTENANCE: History  Substance Use Topics  . Smoking status: Former Smoker -- 40 years    Types: Cigarettes  . Smokeless tobacco: Never Used     Comment: stopped smoking 2009  . Alcohol Use: No    Allergies  Allergen Reactions  . Darvon Other (See Comments)    Gets her "high"    Current Outpatient Prescriptions  Medication Sig Dispense Refill  . alendronate (FOSAMAX) 70 MG tablet Take 70 mg by mouth every 7 (seven) days. Take with a full glass of water on an empty stomach.      Marland Kitchen CALCIUM PO Take 1,500 mg by mouth 2 (two) times daily.      . celecoxib (CELEBREX) 200 MG capsule Take 200 mg by mouth daily.      . cholecalciferol (VITAMIN D) 1000 UNITS tablet Take 1,000 Units by mouth daily.      . diazepam (VALIUM) 2 MG tablet Take 2 mg by mouth daily.      . diazepam (VALIUM) 2 MG tablet Take 1 tablet (2 mg total) by mouth daily.  30 tablet  5  . diphenhydramine-acetaminophen (TYLENOL PM) 25-500 MG TABS Take 1 tablet by mouth at bedtime as needed. For sleep      . esomeprazole (NEXIUM) 40 MG capsule Take 40 mg by mouth daily before breakfast.      . Glucosamine 500 MG CAPS Take 1 capsule by mouth 3 (three) times daily.      . irbesartan (AVAPRO) 300 MG tablet Take 300 mg by mouth at bedtime.      Marland Kitchen levothyroxine (SYNTHROID, LEVOTHROID) 125 MCG tablet Take 125 mcg  by mouth daily before breakfast.      . methocarbamol (ROBAXIN) 500 MG tablet Take 1 tablet (500 mg total) by mouth every 6 (six) hours as needed.  60 tablet  0  . oxyCODONE-acetaminophen (ROXICET) 5-325 MG per tablet Take one tablet by mouth every eight hours; Take one tablet by mouth every 4 hours as needed for pain  270  tablet  0  . pioglitazone (ACTOS) 30 MG tablet Take 30 mg by mouth daily.      . sitaGLIPtin (JANUVIA) 100 MG tablet Take 100 mg by mouth daily.      . tamoxifen (NOLVADEX) 20 MG tablet Take 20 mg by mouth daily.      Marland Kitchen triamterene-hydrochlorothiazide (MAXZIDE-25) 37.5-25 MG per tablet Take 1 tablet by mouth daily.       No current facility-administered medications for this visit.    OBJECTIVE: Older white woman who appears stated age 67 Vitals:   09/05/13 1317  BP: 130/77  Pulse: 88  Temp: 97.6 F (36.4 C)  Resp: 18     Body mass index is 37.98 kg/(m^2).      ECOG FS:  2  Sclerae unicteric, pupils equal and reactive Oropharynx no thrush or other lesions No cervical or supraclavicular adenopathy Lungs no rales or rhonchi Heart regular rate and rhythm Abd soft, obese, nontender MSK kyphosis but no focal spinal tenderness, no upper extremity lymphedema Neuro: nonfocal, well oriented, positive affect Breasts: The right breast is unremarkable. The right axilla is benign. The left breast is status post lumpectomy and radiation. There is significant induration and distortion of the breast outline in the upper medial quadrant of the right breast, which is of course 4 she had her surgery. This is unchanged from baseline. There is no unusual tenderness, swelling, or erythema. There is no suspicious mass. The left axilla is benign.    LAB RESULTS:   Lab Results  Component Value Date   WBC 5.4 09/05/2013   NEUTROABS 3.3 09/05/2013   HGB 10.1* 09/05/2013   HCT 31.4* 09/05/2013   MCV 92.5 09/05/2013   PLT 181 09/05/2013      Chemistry      Component Value  Date/Time   NA 141 09/05/2013 1231   NA 136 07/23/2013 1517   K 4.9 09/05/2013 1231   K 5.0 07/23/2013 1517   CL 104 07/23/2013 1517   CL 112* 01/31/2013 1429   CO2 18* 09/05/2013 1231   CO2 23 07/23/2013 1517   BUN 23.2 09/05/2013 1231   BUN 26* 07/23/2013 1517   CREATININE 1.5* 09/05/2013 1231   CREATININE 1.53* 07/23/2013 1517      Component Value Date/Time   CALCIUM 9.3 09/05/2013 1231   CALCIUM 9.5 07/23/2013 1517   ALKPHOS 97 09/05/2013 1231   ALKPHOS 51 07/23/2013 1517   AST 23 09/05/2013 1231   AST 20 07/23/2013 1517   ALT 10 09/05/2013 1231   ALT 15 07/23/2013 1517   BILITOT 0.42 09/05/2013 1231   BILITOT 0.3 07/23/2013 1517       Urinalysis    Component Value Date/Time   COLORURINE YELLOW 05/26/2008 1453   APPEARANCEUR CLOUDY* 05/26/2008 1453   LABSPEC 1.017 05/26/2008 1453   PHURINE 6.0 05/26/2008 1453   GLUCOSEU NEGATIVE 05/26/2008 1453   HGBUR NEGATIVE 05/26/2008 1453   BILIRUBINUR NEGATIVE 05/26/2008 1453   KETONESUR NEGATIVE 05/26/2008 1453   PROTEINUR NEGATIVE 05/26/2008 1453   UROBILINOGEN 0.2 05/26/2008 1453   NITRITE NEGATIVE 05/26/2008 1453   LEUKOCYTESUR LARGE* 05/26/2008 1453    STUDIES: No results found.   ASSESSMENT: 72 y.o. [place]:  1.  Status post left breast lumpectomy on 05/29/2008 for a stage IA, T1c N0 invasive lobular carcinoma and lymphovascular invasion with axillary sentinel node biopsy, 1.3 cm, grade 2, ER 83%, PR 29%, Ki-67 15%, HER-2/neu negative, 0/2 positive lymph nodes.  2.  Status post left  breast radiation therapy from 07/21/2008 through 09/03/2008.  3.  The patient took tamoxifen adjuvantly between 08/2008 and 08/2013  4.  Anemia of chronic renal disease    PLAN: Today we reviewed her diagnosis, treatment history and prognosis. I am comfortable releasing her from followup at this point. In patients who are at high risk of recurrence, because there are node positive or for other reasons, we frequently continue tamoxifen an  additional 5 years. This provides another 2-1/2-3% risk reduction. I do not think Aoife is in that category, given her age and multiple comorbidities.  Accordingly we are stopping tamoxifen at this point. She will of course still need yearly mammography and a yearly physician breast exam. She understands we will keep her records other 10 years and if she needs to be reevaluated or if she has any questions related to her breast cancer or another cancer I will be glad to see her. As of now however no further routine appointments are being made for her here.  Lowella Dell, MD  09/05/2013, 1:22 PM

## 2013-09-06 ENCOUNTER — Telehealth: Payer: Self-pay | Admitting: Oncology

## 2013-09-06 DIAGNOSIS — I10 Essential (primary) hypertension: Secondary | ICD-10-CM | POA: Diagnosis not present

## 2013-09-06 DIAGNOSIS — Z5189 Encounter for other specified aftercare: Secondary | ICD-10-CM | POA: Diagnosis not present

## 2013-09-06 DIAGNOSIS — E119 Type 2 diabetes mellitus without complications: Secondary | ICD-10-CM | POA: Diagnosis not present

## 2013-09-06 DIAGNOSIS — Z96659 Presence of unspecified artificial knee joint: Secondary | ICD-10-CM | POA: Diagnosis not present

## 2013-09-06 DIAGNOSIS — Z471 Aftercare following joint replacement surgery: Secondary | ICD-10-CM | POA: Diagnosis not present

## 2013-09-06 NOTE — Telephone Encounter (Signed)
Sent letter to [facility] office from Dr.Magrinat

## 2013-09-19 DIAGNOSIS — M1710 Unilateral primary osteoarthritis, unspecified knee: Secondary | ICD-10-CM | POA: Diagnosis not present

## 2013-10-02 ENCOUNTER — Ambulatory Visit: Payer: Medicare Other | Admitting: Podiatry

## 2013-10-10 ENCOUNTER — Encounter: Payer: Self-pay | Admitting: Podiatry

## 2013-10-10 ENCOUNTER — Ambulatory Visit: Payer: Medicare Other | Admitting: Podiatry

## 2013-10-10 ENCOUNTER — Ambulatory Visit (INDEPENDENT_AMBULATORY_CARE_PROVIDER_SITE_OTHER): Payer: Medicare Other | Admitting: Podiatry

## 2013-10-10 VITALS — BP 104/73 | HR 77 | Resp 20 | Ht 68.0 in | Wt 248.0 lb

## 2013-10-10 DIAGNOSIS — B351 Tinea unguium: Secondary | ICD-10-CM | POA: Diagnosis not present

## 2013-10-10 DIAGNOSIS — M79609 Pain in unspecified limb: Secondary | ICD-10-CM | POA: Diagnosis not present

## 2013-10-10 NOTE — Progress Notes (Signed)
   Subjective:    Patient ID: Autumn Chambers, female    DOB: 1941/05/22, 72 y.o.   MRN: [id number]  HPI Comments: "I just need my nails trimmed"  Pt needs toenails trimmed and diabetic foot check. Been having salon do them but wanted a doc to check feet too since diabetic. Unable to get down to trim herself.     Review of Systems  HENT: Positive for hearing loss.   Respiratory: Positive for shortness of breath.   Cardiovascular: Positive for leg swelling.  Endocrine: Positive for cold intolerance.  Musculoskeletal: Positive for arthralgias and gait problem.  All other systems reviewed and are negative.       Objective:   Physical Exam        Assessment & Plan:

## 2013-10-10 NOTE — Patient Instructions (Signed)
Diabetes and Foot Care Diabetes may cause you to have problems because of poor blood supply (circulation) to your feet and legs. This may cause the skin on your feet to become thinner, break easier, and heal more slowly. Your skin may become dry, and the skin may peel and crack. You may also have nerve damage in your legs and feet causing decreased feeling in them. You may not notice minor injuries to your feet that could lead to infections or more serious problems. Taking care of your feet is one of the most important things you can do for yourself.  HOME CARE INSTRUCTIONS  Wear shoes at all times, even in the house. Do not go barefoot. Bare feet are easily injured.  Check your feet daily for blisters, cuts, and redness. If you cannot see the bottom of your feet, use a mirror or ask someone for help.  Wash your feet with warm water (do not use hot water) and mild soap. Then pat your feet and the areas between your toes until they are completely dry. Do not soak your feet as this can dry your skin.  Apply a moisturizing lotion or petroleum jelly (that does not contain alcohol and is unscented) to the skin on your feet and to dry, brittle toenails. Do not apply lotion between your toes.  Trim your toenails straight across. Do not dig under them or around the cuticle. File the edges of your nails with an emery board or nail file.  Do not cut corns or calluses or try to remove them with medicine.  Wear clean socks or stockings every day. Make sure they are not too tight. Do not wear knee-high stockings since they may decrease blood flow to your legs.  Wear shoes that fit properly and have enough cushioning. To break in new shoes, wear them for just a few hours a day. This prevents you from injuring your feet. Always look in your shoes before you put them on to be sure there are no objects inside.  Do not cross your legs. This may decrease the blood flow to your feet.  If you find a minor scrape,  cut, or break in the skin on your feet, keep it and the skin around it clean and dry. These areas may be cleansed with mild soap and water. Do not cleanse the area with peroxide, alcohol, or iodine.  When you remove an adhesive bandage, be sure not to damage the skin around it.  If you have a wound, look at it several times a day to make sure it is healing.  Do not use heating pads or hot water bottles. They may burn your skin. If you have lost feeling in your feet or legs, you may not know it is happening until it is too late.  Make sure your health care provider performs a complete foot exam at least annually or more often if you have foot problems. Report any cuts, sores, or bruises to your health care provider immediately. SEEK MEDICAL CARE IF:   You have an injury that is not healing.  You have cuts or breaks in the skin.  You have an ingrown nail.  You notice redness on your legs or feet.  You feel burning or tingling in your legs or feet.  You have pain or cramps in your legs and feet.  Your legs or feet are numb.  Your feet always feel cold. SEEK IMMEDIATE MEDICAL CARE IF:   There is increasing redness,   swelling, or pain in or around a wound.  There is a red line that goes up your leg.  Pus is coming from a wound.  You develop a fever or as directed by your health care provider.  You notice a bad smell coming from an ulcer or wound. Document Released: 10/14/2000 Document Revised: 06/19/2013 Document Reviewed: 03/26/2013 Exit[employer] Patient Information 2014 [employer].  

## 2013-10-13 NOTE — Progress Notes (Signed)
Subjective:     Patient ID: Autumn Chambers, female   DOB: [birth year], 72 y.o.   MRN: [id number]  HPI 72 year old female with nail disease 1-5 both feet that wanted to get checked and needs to have toenails trimmed as she is unable to accomplish her self and they are tender   Review of Systems  All other systems reviewed and are negative.       Objective:   Physical Exam  Nursing note and vitals reviewed. Constitutional: She is oriented to person, place, and time.  Cardiovascular: Intact distal pulses.   Musculoskeletal: Normal range of motion.  Neurological: She is oriented to person, place, and time.  Skin: Skin is dry.   diminishment of neurological sensation sharp toe and vibratory and muscle strength that is adequate with mild equinus condition noted both feet. Nail disease 1-5 both feet that are incurvated in the corners     Assessment:     At risk diabetic with mycotic nail infection 1-5 both feet    Plan:     Debridement nailbeds 1-5 both feet with no iatrogenic bleeding noted and diabetic foot exam with instructions to patient

## 2013-10-21 ENCOUNTER — Ambulatory Visit: Payer: Medicare Other | Admitting: Podiatry

## 2013-11-11 DIAGNOSIS — Z961 Presence of intraocular lens: Secondary | ICD-10-CM | POA: Diagnosis not present

## 2013-11-11 DIAGNOSIS — H52209 Unspecified astigmatism, unspecified eye: Secondary | ICD-10-CM | POA: Diagnosis not present

## 2013-11-11 DIAGNOSIS — H179 Unspecified corneal scar and opacity: Secondary | ICD-10-CM | POA: Diagnosis not present

## 2013-11-11 DIAGNOSIS — E119 Type 2 diabetes mellitus without complications: Secondary | ICD-10-CM | POA: Diagnosis not present

## 2013-11-20 DIAGNOSIS — E039 Hypothyroidism, unspecified: Secondary | ICD-10-CM | POA: Diagnosis not present

## 2013-11-20 DIAGNOSIS — E1129 Type 2 diabetes mellitus with other diabetic kidney complication: Secondary | ICD-10-CM | POA: Diagnosis not present

## 2013-11-20 DIAGNOSIS — N183 Chronic kidney disease, stage 3 unspecified: Secondary | ICD-10-CM | POA: Diagnosis not present

## 2013-11-20 DIAGNOSIS — M2550 Pain in unspecified joint: Secondary | ICD-10-CM | POA: Diagnosis not present

## 2013-11-20 DIAGNOSIS — I10 Essential (primary) hypertension: Secondary | ICD-10-CM | POA: Diagnosis not present

## 2013-12-03 DIAGNOSIS — L570 Actinic keratosis: Secondary | ICD-10-CM | POA: Diagnosis not present

## 2013-12-03 DIAGNOSIS — Z85828 Personal history of other malignant neoplasm of skin: Secondary | ICD-10-CM | POA: Diagnosis not present

## 2013-12-04 DIAGNOSIS — N183 Chronic kidney disease, stage 3 unspecified: Secondary | ICD-10-CM | POA: Diagnosis not present

## 2013-12-04 DIAGNOSIS — E1129 Type 2 diabetes mellitus with other diabetic kidney complication: Secondary | ICD-10-CM | POA: Diagnosis not present

## 2013-12-04 DIAGNOSIS — I10 Essential (primary) hypertension: Secondary | ICD-10-CM | POA: Diagnosis not present

## 2013-12-04 DIAGNOSIS — E039 Hypothyroidism, unspecified: Secondary | ICD-10-CM | POA: Diagnosis not present

## 2014-01-01 DIAGNOSIS — D631 Anemia in chronic kidney disease: Secondary | ICD-10-CM | POA: Diagnosis not present

## 2014-01-01 DIAGNOSIS — N2581 Secondary hyperparathyroidism of renal origin: Secondary | ICD-10-CM | POA: Diagnosis not present

## 2014-01-01 DIAGNOSIS — N039 Chronic nephritic syndrome with unspecified morphologic changes: Secondary | ICD-10-CM | POA: Diagnosis not present

## 2014-01-01 DIAGNOSIS — N189 Chronic kidney disease, unspecified: Secondary | ICD-10-CM | POA: Diagnosis not present

## 2014-01-06 DIAGNOSIS — N039 Chronic nephritic syndrome with unspecified morphologic changes: Secondary | ICD-10-CM | POA: Diagnosis not present

## 2014-01-06 DIAGNOSIS — I129 Hypertensive chronic kidney disease with stage 1 through stage 4 chronic kidney disease, or unspecified chronic kidney disease: Secondary | ICD-10-CM | POA: Diagnosis not present

## 2014-01-06 DIAGNOSIS — N183 Chronic kidney disease, stage 3 unspecified: Secondary | ICD-10-CM | POA: Diagnosis not present

## 2014-01-06 DIAGNOSIS — N2581 Secondary hyperparathyroidism of renal origin: Secondary | ICD-10-CM | POA: Diagnosis not present

## 2014-01-06 DIAGNOSIS — D631 Anemia in chronic kidney disease: Secondary | ICD-10-CM | POA: Diagnosis not present

## 2014-01-09 ENCOUNTER — Ambulatory Visit (INDEPENDENT_AMBULATORY_CARE_PROVIDER_SITE_OTHER): Payer: Medicare Other | Admitting: Podiatry

## 2014-01-09 ENCOUNTER — Encounter: Payer: Self-pay | Admitting: Podiatry

## 2014-01-09 VITALS — BP 151/64 | HR 85 | Resp 18 | Ht 67.0 in | Wt 246.0 lb

## 2014-01-09 DIAGNOSIS — M79609 Pain in unspecified limb: Secondary | ICD-10-CM | POA: Diagnosis not present

## 2014-01-09 DIAGNOSIS — B351 Tinea unguium: Secondary | ICD-10-CM | POA: Diagnosis not present

## 2014-01-09 NOTE — Progress Notes (Signed)
   Subjective:    Patient ID: Autumn Chambers, female    DOB: 03/31/1941, 73 y.o.   MRN: [id number] Pt presents for debridement of B/L 1 -5 toenails. HPI    Review of Systems     Objective:   Physical Exam        Assessment & Plan:

## 2014-01-11 NOTE — Progress Notes (Signed)
Subjective:     Patient ID: Autumn Chambers, female   DOB: [birth year], 73 y.o.   MRN: [id number]  HPI patient presents with thick painful nail bed 1-5 both feet that she cannot cut   Review of Systems     Objective:   Physical Exam Neurovascular status intact with no health history changes noted and painful nails 1-5 both feet that are thick yellow and brittle    Assessment:     Mycotic nail infection with pain 1-5 both feet    Plan:     Debridement of painful nail bed 1-5 both feet with no bleeding noted

## 2014-02-28 ENCOUNTER — Other Ambulatory Visit: Payer: Self-pay | Admitting: *Deleted

## 2014-02-28 DIAGNOSIS — C50212 Malignant neoplasm of upper-inner quadrant of left female breast: Secondary | ICD-10-CM

## 2014-03-31 DIAGNOSIS — Z853 Personal history of malignant neoplasm of breast: Secondary | ICD-10-CM | POA: Diagnosis not present

## 2014-04-03 ENCOUNTER — Encounter: Payer: Self-pay | Admitting: Podiatry

## 2014-04-03 ENCOUNTER — Ambulatory Visit (INDEPENDENT_AMBULATORY_CARE_PROVIDER_SITE_OTHER): Payer: Medicare Other | Admitting: Podiatry

## 2014-04-03 VITALS — BP 176/84 | HR 78 | Resp 18 | Ht 68.0 in | Wt 246.0 lb

## 2014-04-03 DIAGNOSIS — B351 Tinea unguium: Secondary | ICD-10-CM | POA: Diagnosis not present

## 2014-04-03 DIAGNOSIS — M79609 Pain in unspecified limb: Secondary | ICD-10-CM | POA: Diagnosis not present

## 2014-04-04 NOTE — Progress Notes (Signed)
Subjective:     Patient ID: Autumn Chambers, female   DOB: September 16, 1941, 73 y.o.   MRN: [id number]  HPI patient presents with thick nailbeds 1-5 both feet that she cannot cut and they become painful   Review of Systems     Objective:   Physical Exam Neurovascular status intact with thick nailbeds 1-5 both feet with brittle yellow appearance    Assessment:     Mycotic nail infection that are painful 1-5 both feet    Plan:     Debridement painful nailbeds 1-5 both feet

## 2014-05-05 DIAGNOSIS — H1030 Unspecified acute conjunctivitis, unspecified eye: Secondary | ICD-10-CM | POA: Diagnosis not present

## 2014-05-21 DIAGNOSIS — N899 Noninflammatory disorder of vagina, unspecified: Secondary | ICD-10-CM | POA: Diagnosis not present

## 2014-05-21 DIAGNOSIS — E039 Hypothyroidism, unspecified: Secondary | ICD-10-CM | POA: Diagnosis not present

## 2014-05-21 DIAGNOSIS — Z853 Personal history of malignant neoplasm of breast: Secondary | ICD-10-CM | POA: Diagnosis not present

## 2014-05-21 DIAGNOSIS — E1129 Type 2 diabetes mellitus with other diabetic kidney complication: Secondary | ICD-10-CM | POA: Diagnosis not present

## 2014-05-21 DIAGNOSIS — Z0000 Encounter for general adult medical examination without abnormal findings: Secondary | ICD-10-CM | POA: Diagnosis not present

## 2014-05-21 DIAGNOSIS — N183 Chronic kidney disease, stage 3 unspecified: Secondary | ICD-10-CM | POA: Diagnosis not present

## 2014-05-21 DIAGNOSIS — I10 Essential (primary) hypertension: Secondary | ICD-10-CM | POA: Diagnosis not present

## 2014-05-21 DIAGNOSIS — Z1331 Encounter for screening for depression: Secondary | ICD-10-CM | POA: Diagnosis not present

## 2014-06-05 DIAGNOSIS — H905 Unspecified sensorineural hearing loss: Secondary | ICD-10-CM | POA: Diagnosis not present

## 2014-06-05 DIAGNOSIS — H903 Sensorineural hearing loss, bilateral: Secondary | ICD-10-CM | POA: Diagnosis not present

## 2014-07-03 ENCOUNTER — Ambulatory Visit (INDEPENDENT_AMBULATORY_CARE_PROVIDER_SITE_OTHER): Payer: Medicare Other | Admitting: Podiatry

## 2014-07-03 DIAGNOSIS — M79673 Pain in unspecified foot: Secondary | ICD-10-CM

## 2014-07-03 DIAGNOSIS — N2581 Secondary hyperparathyroidism of renal origin: Secondary | ICD-10-CM | POA: Diagnosis not present

## 2014-07-03 DIAGNOSIS — M79609 Pain in unspecified limb: Secondary | ICD-10-CM | POA: Diagnosis not present

## 2014-07-03 DIAGNOSIS — B351 Tinea unguium: Secondary | ICD-10-CM

## 2014-07-03 DIAGNOSIS — D631 Anemia in chronic kidney disease: Secondary | ICD-10-CM | POA: Diagnosis not present

## 2014-07-03 DIAGNOSIS — N189 Chronic kidney disease, unspecified: Secondary | ICD-10-CM | POA: Diagnosis not present

## 2014-07-03 NOTE — Progress Notes (Signed)
   Subjective:    Patient ID: Autumn Chambers, female    DOB: Nov 18, 1940, 73 y.o.   MRN: [id number]  HPI Pt presents for nail debridement   Review of Systems     Objective:   Physical Exam        Assessment & Plan:

## 2014-07-03 NOTE — Progress Notes (Signed)
Subjective:     Patient ID: Autumn Chambers, female   DOB: [birth year], 73 y.o.   MRN: [id number]  HPI patient presents with elongated nails 1-5 of both feet that are thick and painful   Review of Systems     Objective:   Physical Exam Neurovascular status intact with thick yellow brittle nailbeds 1-5 both feet    Assessment:     Mycotic nail infection is with pain 1-5 both feet    Plan:     Debride painful nailbeds 1-5 both feet with no iatrogenic bleeding noted

## 2014-07-09 DIAGNOSIS — I129 Hypertensive chronic kidney disease with stage 1 through stage 4 chronic kidney disease, or unspecified chronic kidney disease: Secondary | ICD-10-CM | POA: Diagnosis not present

## 2014-07-09 DIAGNOSIS — D631 Anemia in chronic kidney disease: Secondary | ICD-10-CM | POA: Diagnosis not present

## 2014-07-09 DIAGNOSIS — Z23 Encounter for immunization: Secondary | ICD-10-CM | POA: Diagnosis not present

## 2014-07-09 DIAGNOSIS — N039 Chronic nephritic syndrome with unspecified morphologic changes: Secondary | ICD-10-CM | POA: Diagnosis not present

## 2014-07-09 DIAGNOSIS — N2581 Secondary hyperparathyroidism of renal origin: Secondary | ICD-10-CM | POA: Diagnosis not present

## 2014-07-09 DIAGNOSIS — N189 Chronic kidney disease, unspecified: Secondary | ICD-10-CM | POA: Diagnosis not present

## 2014-07-17 ENCOUNTER — Telehealth: Payer: Self-pay | Admitting: *Deleted

## 2014-07-17 NOTE — Telephone Encounter (Signed)
Copy of Dr. Serita Grit office visit from 07/09/14 given to Dr. Jana Hakim for review and copy sent to HIM to be scanned into patient's chart.

## 2014-09-02 DIAGNOSIS — G4733 Obstructive sleep apnea (adult) (pediatric): Secondary | ICD-10-CM | POA: Diagnosis not present

## 2014-10-02 ENCOUNTER — Ambulatory Visit: Payer: Medicare Other

## 2014-10-06 ENCOUNTER — Ambulatory Visit (INDEPENDENT_AMBULATORY_CARE_PROVIDER_SITE_OTHER): Payer: Medicare Other | Admitting: Podiatry

## 2014-10-06 DIAGNOSIS — B351 Tinea unguium: Secondary | ICD-10-CM

## 2014-10-06 DIAGNOSIS — M79673 Pain in unspecified foot: Secondary | ICD-10-CM | POA: Diagnosis not present

## 2014-10-06 NOTE — Progress Notes (Signed)
Subjective:     Patient ID: Autumn Chambers, female   DOB: [birth year], 73 y.o.   MRN: [id number]  HPI patient presents with elongated nails 1-5 of both feet that are thick and painful   Review of Systems     Objective:   Physical Exam Neurovascular status intact with thick yellow brittle nailbeds 1-5 both feet    Assessment:     Mycotic nail infection is with pain 1-5 both feet    Plan:     Debride painful nailbeds 1-5 both feet with no iatrogenic bleeding noted

## 2014-11-04 DIAGNOSIS — M25571 Pain in right ankle and joints of right foot: Secondary | ICD-10-CM | POA: Diagnosis not present

## 2014-11-14 DIAGNOSIS — H52203 Unspecified astigmatism, bilateral: Secondary | ICD-10-CM | POA: Diagnosis not present

## 2014-11-14 DIAGNOSIS — H40011 Open angle with borderline findings, low risk, right eye: Secondary | ICD-10-CM | POA: Diagnosis not present

## 2014-11-14 DIAGNOSIS — Z961 Presence of intraocular lens: Secondary | ICD-10-CM | POA: Diagnosis not present

## 2014-11-14 DIAGNOSIS — E119 Type 2 diabetes mellitus without complications: Secondary | ICD-10-CM | POA: Diagnosis not present

## 2014-11-18 DIAGNOSIS — M25571 Pain in right ankle and joints of right foot: Secondary | ICD-10-CM | POA: Diagnosis not present

## 2014-12-03 DIAGNOSIS — M79671 Pain in right foot: Secondary | ICD-10-CM | POA: Diagnosis not present

## 2014-12-03 DIAGNOSIS — M25571 Pain in right ankle and joints of right foot: Secondary | ICD-10-CM | POA: Diagnosis not present

## 2014-12-03 DIAGNOSIS — M1009 Idiopathic gout, multiple sites: Secondary | ICD-10-CM | POA: Diagnosis not present

## 2014-12-03 DIAGNOSIS — M109 Gout, unspecified: Secondary | ICD-10-CM | POA: Diagnosis not present

## 2014-12-29 DIAGNOSIS — M25571 Pain in right ankle and joints of right foot: Secondary | ICD-10-CM | POA: Diagnosis not present

## 2014-12-30 DIAGNOSIS — I10 Essential (primary) hypertension: Secondary | ICD-10-CM | POA: Diagnosis not present

## 2014-12-30 DIAGNOSIS — E785 Hyperlipidemia, unspecified: Secondary | ICD-10-CM | POA: Diagnosis not present

## 2014-12-30 DIAGNOSIS — N183 Chronic kidney disease, stage 3 (moderate): Secondary | ICD-10-CM | POA: Diagnosis not present

## 2014-12-30 DIAGNOSIS — G4733 Obstructive sleep apnea (adult) (pediatric): Secondary | ICD-10-CM | POA: Diagnosis not present

## 2014-12-30 DIAGNOSIS — E039 Hypothyroidism, unspecified: Secondary | ICD-10-CM | POA: Diagnosis not present

## 2014-12-30 DIAGNOSIS — Z853 Personal history of malignant neoplasm of breast: Secondary | ICD-10-CM | POA: Diagnosis not present

## 2014-12-30 DIAGNOSIS — D649 Anemia, unspecified: Secondary | ICD-10-CM | POA: Diagnosis not present

## 2014-12-30 DIAGNOSIS — E1122 Type 2 diabetes mellitus with diabetic chronic kidney disease: Secondary | ICD-10-CM | POA: Diagnosis not present

## 2014-12-30 DIAGNOSIS — E669 Obesity, unspecified: Secondary | ICD-10-CM | POA: Diagnosis not present

## 2014-12-30 DIAGNOSIS — M810 Age-related osteoporosis without current pathological fracture: Secondary | ICD-10-CM | POA: Diagnosis not present

## 2015-01-05 ENCOUNTER — Ambulatory Visit: Payer: Medicare Other

## 2015-01-07 ENCOUNTER — Encounter: Payer: Self-pay | Admitting: Podiatry

## 2015-01-07 ENCOUNTER — Ambulatory Visit (INDEPENDENT_AMBULATORY_CARE_PROVIDER_SITE_OTHER): Payer: Medicare Other | Admitting: Podiatry

## 2015-01-07 DIAGNOSIS — B351 Tinea unguium: Secondary | ICD-10-CM

## 2015-01-07 DIAGNOSIS — M79676 Pain in unspecified toe(s): Secondary | ICD-10-CM | POA: Diagnosis not present

## 2015-01-07 NOTE — Patient Instructions (Signed)
Diabetes and Foot Care Diabetes may cause you to have problems because of poor blood supply (circulation) to your feet and legs. This may cause the skin on your feet to become thinner, break easier, and heal more slowly. Your skin may become dry, and the skin may peel and crack. You may also have nerve damage in your legs and feet causing decreased feeling in them. You may not notice minor injuries to your feet that could lead to infections or more serious problems. Taking care of your feet is one of the most important things you can do for yourself.  HOME CARE INSTRUCTIONS  Wear shoes at all times, even in the house. Do not go barefoot. Bare feet are easily injured.  Check your feet daily for blisters, cuts, and redness. If you cannot see the bottom of your feet, use a mirror or ask someone for help.  Wash your feet with warm water (do not use hot water) and mild soap. Then pat your feet and the areas between your toes until they are completely dry. Do not soak your feet as this can dry your skin.  Apply a moisturizing lotion or petroleum jelly (that does not contain alcohol and is unscented) to the skin on your feet and to dry, brittle toenails. Do not apply lotion between your toes.  Trim your toenails straight across. Do not dig under them or around the cuticle. File the edges of your nails with an emery board or nail file.  Do not cut corns or calluses or try to remove them with medicine.  Wear clean socks or stockings every day. Make sure they are not too tight. Do not wear knee-high stockings since they may decrease blood flow to your legs.  Wear shoes that fit properly and have enough cushioning. To break in new shoes, wear them for just a few hours a day. This prevents you from injuring your feet. Always look in your shoes before you put them on to be sure there are no objects inside.  Do not cross your legs. This may decrease the blood flow to your feet.  If you find a minor scrape,  cut, or break in the skin on your feet, keep it and the skin around it clean and dry. These areas may be cleansed with mild soap and water. Do not cleanse the area with peroxide, alcohol, or iodine.  When you remove an adhesive bandage, be sure not to damage the skin around it.  If you have a wound, look at it several times a day to make sure it is healing.  Do not use heating pads or hot water bottles. They may burn your skin. If you have lost feeling in your feet or legs, you may not know it is happening until it is too late.  Make sure your health care provider performs a complete foot exam at least annually or more often if you have foot problems. Report any cuts, sores, or bruises to your health care provider immediately. SEEK MEDICAL CARE IF:   You have an injury that is not healing.  You have cuts or breaks in the skin.  You have an ingrown nail.  You notice redness on your legs or feet.  You feel burning or tingling in your legs or feet.  You have pain or cramps in your legs and feet.  Your legs or feet are numb.  Your feet always feel cold. SEEK IMMEDIATE MEDICAL CARE IF:   There is increasing redness,   swelling, or pain in or around a wound.  There is a red line that goes up your leg.  Pus is coming from a wound.  You develop a fever or as directed by your health care provider.  You notice a bad smell coming from an ulcer or wound. Document Released: 10/14/2000 Document Revised: 06/19/2013 Document Reviewed: 03/26/2013 ExitCare Patient Information 2015 [employer]. This information is not intended to replace advice given to you by your health care provider. Make sure you discuss any questions you have with your health care provider.  

## 2015-01-08 NOTE — Progress Notes (Signed)
Patient ID: Autumn Chambers, female   DOB: [birth year], 74 y.o.   MRN: [id number]  Subjective: This diabetic patient presents complaining of painful toenails and requests  Objective: The toenails are elongated, hypertrophic, incurvated, discolored, and tender to palpation 6-10  Assessment: Symptomatic onychomycoses 6-10  Plan: Debridement of toenails 10 without any bleeding  Reappoint 3 months

## 2015-01-21 DIAGNOSIS — D485 Neoplasm of uncertain behavior of skin: Secondary | ICD-10-CM | POA: Diagnosis not present

## 2015-01-21 DIAGNOSIS — D1801 Hemangioma of skin and subcutaneous tissue: Secondary | ICD-10-CM | POA: Diagnosis not present

## 2015-01-21 DIAGNOSIS — L821 Other seborrheic keratosis: Secondary | ICD-10-CM | POA: Diagnosis not present

## 2015-01-21 DIAGNOSIS — L570 Actinic keratosis: Secondary | ICD-10-CM | POA: Diagnosis not present

## 2015-01-21 DIAGNOSIS — Z85828 Personal history of other malignant neoplasm of skin: Secondary | ICD-10-CM | POA: Diagnosis not present

## 2015-01-21 DIAGNOSIS — C44612 Basal cell carcinoma of skin of right upper limb, including shoulder: Secondary | ICD-10-CM | POA: Diagnosis not present

## 2015-02-02 DIAGNOSIS — H4011X1 Primary open-angle glaucoma, mild stage: Secondary | ICD-10-CM | POA: Diagnosis not present

## 2015-02-05 DIAGNOSIS — N189 Chronic kidney disease, unspecified: Secondary | ICD-10-CM | POA: Diagnosis not present

## 2015-02-05 DIAGNOSIS — N2581 Secondary hyperparathyroidism of renal origin: Secondary | ICD-10-CM | POA: Diagnosis not present

## 2015-02-05 DIAGNOSIS — D631 Anemia in chronic kidney disease: Secondary | ICD-10-CM | POA: Diagnosis not present

## 2015-02-05 DIAGNOSIS — I129 Hypertensive chronic kidney disease with stage 1 through stage 4 chronic kidney disease, or unspecified chronic kidney disease: Secondary | ICD-10-CM | POA: Diagnosis not present

## 2015-03-02 DIAGNOSIS — M25571 Pain in right ankle and joints of right foot: Secondary | ICD-10-CM | POA: Diagnosis not present

## 2015-03-02 DIAGNOSIS — M1009 Idiopathic gout, multiple sites: Secondary | ICD-10-CM | POA: Diagnosis not present

## 2015-03-02 DIAGNOSIS — M79671 Pain in right foot: Secondary | ICD-10-CM | POA: Diagnosis not present

## 2015-03-02 DIAGNOSIS — M109 Gout, unspecified: Secondary | ICD-10-CM | POA: Diagnosis not present

## 2015-03-10 DIAGNOSIS — C44612 Basal cell carcinoma of skin of right upper limb, including shoulder: Secondary | ICD-10-CM | POA: Diagnosis not present

## 2015-03-16 DIAGNOSIS — H4011X1 Primary open-angle glaucoma, mild stage: Secondary | ICD-10-CM | POA: Diagnosis not present

## 2015-04-01 DIAGNOSIS — I10 Essential (primary) hypertension: Secondary | ICD-10-CM | POA: Diagnosis not present

## 2015-04-01 DIAGNOSIS — D649 Anemia, unspecified: Secondary | ICD-10-CM | POA: Diagnosis not present

## 2015-04-01 DIAGNOSIS — E039 Hypothyroidism, unspecified: Secondary | ICD-10-CM | POA: Diagnosis not present

## 2015-04-01 DIAGNOSIS — E538 Deficiency of other specified B group vitamins: Secondary | ICD-10-CM | POA: Diagnosis not present

## 2015-04-01 DIAGNOSIS — Z23 Encounter for immunization: Secondary | ICD-10-CM | POA: Diagnosis not present

## 2015-04-01 DIAGNOSIS — R0609 Other forms of dyspnea: Secondary | ICD-10-CM | POA: Diagnosis not present

## 2015-04-01 DIAGNOSIS — R609 Edema, unspecified: Secondary | ICD-10-CM | POA: Diagnosis not present

## 2015-04-01 DIAGNOSIS — E785 Hyperlipidemia, unspecified: Secondary | ICD-10-CM | POA: Diagnosis not present

## 2015-04-06 DIAGNOSIS — Z853 Personal history of malignant neoplasm of breast: Secondary | ICD-10-CM | POA: Diagnosis not present

## 2015-04-13 ENCOUNTER — Ambulatory Visit: Payer: Medicare Other | Admitting: Podiatry

## 2015-04-14 ENCOUNTER — Other Ambulatory Visit: Payer: Self-pay | Admitting: Family Medicine

## 2015-04-14 ENCOUNTER — Other Ambulatory Visit: Payer: Self-pay

## 2015-04-14 ENCOUNTER — Ambulatory Visit (HOSPITAL_COMMUNITY): Payer: Medicare Other | Attending: Cardiology

## 2015-04-14 DIAGNOSIS — R0609 Other forms of dyspnea: Secondary | ICD-10-CM | POA: Diagnosis not present

## 2015-04-14 DIAGNOSIS — I517 Cardiomegaly: Secondary | ICD-10-CM | POA: Diagnosis not present

## 2015-04-14 DIAGNOSIS — I350 Nonrheumatic aortic (valve) stenosis: Secondary | ICD-10-CM | POA: Insufficient documentation

## 2015-04-14 DIAGNOSIS — I059 Rheumatic mitral valve disease, unspecified: Secondary | ICD-10-CM | POA: Diagnosis not present

## 2015-04-15 ENCOUNTER — Ambulatory Visit (INDEPENDENT_AMBULATORY_CARE_PROVIDER_SITE_OTHER): Payer: Medicare Other | Admitting: Podiatry

## 2015-04-15 ENCOUNTER — Encounter: Payer: Self-pay | Admitting: Podiatry

## 2015-04-15 DIAGNOSIS — M79676 Pain in unspecified toe(s): Secondary | ICD-10-CM | POA: Diagnosis not present

## 2015-04-15 DIAGNOSIS — B351 Tinea unguium: Secondary | ICD-10-CM

## 2015-04-15 NOTE — Patient Instructions (Signed)
Diabetes and Foot Care Diabetes may cause you to have problems because of poor blood supply (circulation) to your feet and legs. This may cause the skin on your feet to become thinner, break easier, and heal more slowly. Your skin may become dry, and the skin may peel and crack. You may also have nerve damage in your legs and feet causing decreased feeling in them. You may not notice minor injuries to your feet that could lead to infections or more serious problems. Taking care of your feet is one of the most important things you can do for yourself.  HOME CARE INSTRUCTIONS  Wear shoes at all times, even in the house. Do not go barefoot. Bare feet are easily injured.  Check your feet daily for blisters, cuts, and redness. If you cannot see the bottom of your feet, use a mirror or ask someone for help.  Wash your feet with warm water (do not use hot water) and mild soap. Then pat your feet and the areas between your toes until they are completely dry. Do not soak your feet as this can dry your skin.  Apply a moisturizing lotion or petroleum jelly (that does not contain alcohol and is unscented) to the skin on your feet and to dry, brittle toenails. Do not apply lotion between your toes.  Trim your toenails straight across. Do not dig under them or around the cuticle. File the edges of your nails with an emery board or nail file.  Do not cut corns or calluses or try to remove them with medicine.  Wear clean socks or stockings every day. Make sure they are not too tight. Do not wear knee-high stockings since they may decrease blood flow to your legs.  Wear shoes that fit properly and have enough cushioning. To break in new shoes, wear them for just a few hours a day. This prevents you from injuring your feet. Always look in your shoes before you put them on to be sure there are no objects inside.  Do not cross your legs. This may decrease the blood flow to your feet.  If you find a minor scrape,  cut, or break in the skin on your feet, keep it and the skin around it clean and dry. These areas may be cleansed with mild soap and water. Do not cleanse the area with peroxide, alcohol, or iodine.  When you remove an adhesive bandage, be sure not to damage the skin around it.  If you have a wound, look at it several times a day to make sure it is healing.  Do not use heating pads or hot water bottles. They may burn your skin. If you have lost feeling in your feet or legs, you may not know it is happening until it is too late.  Make sure your health care provider performs a complete foot exam at least annually or more often if you have foot problems. Report any cuts, sores, or bruises to your health care provider immediately. SEEK MEDICAL CARE IF:   You have an injury that is not healing.  You have cuts or breaks in the skin.  You have an ingrown nail.  You notice redness on your legs or feet.  You feel burning or tingling in your legs or feet.  You have pain or cramps in your legs and feet.  Your legs or feet are numb.  Your feet always feel cold. SEEK IMMEDIATE MEDICAL CARE IF:   There is increasing redness,   swelling, or pain in or around a wound.  There is a red line that goes up your leg.  Pus is coming from a wound.  You develop a fever or as directed by your health care provider.  You notice a bad smell coming from an ulcer or wound. Document Released: 10/14/2000 Document Revised: 06/19/2013 Document Reviewed: 03/26/2013 ExitCare Patient Information 2015 [employer]. This information is not intended to replace advice given to you by your health care provider. Make sure you discuss any questions you have with your health care provider.  

## 2015-04-15 NOTE — Progress Notes (Signed)
Patient ID: Autumn Chambers, female   DOB: 10/26/1941, 74 y.o.   MRN: [id number]  Subjective: This patient presents again complaining of painful toenails and requesting nail debridement  Objective: Toenails are elongated, brittle, discolored, incurvated and tender to direct palpation 6-10  Assessment: Symptomatic onychomycoses 6-10 Diabetic  Plan: Debrided toenails 10 without any bleeding  Reappoint 3 months

## 2015-05-05 DIAGNOSIS — D509 Iron deficiency anemia, unspecified: Secondary | ICD-10-CM | POA: Diagnosis not present

## 2015-05-11 DIAGNOSIS — R0602 Shortness of breath: Secondary | ICD-10-CM | POA: Diagnosis not present

## 2015-05-11 DIAGNOSIS — D649 Anemia, unspecified: Secondary | ICD-10-CM | POA: Diagnosis not present

## 2015-05-25 DIAGNOSIS — R0602 Shortness of breath: Secondary | ICD-10-CM | POA: Diagnosis not present

## 2015-06-02 DIAGNOSIS — Z86010 Personal history of colonic polyps: Secondary | ICD-10-CM | POA: Diagnosis not present

## 2015-06-02 DIAGNOSIS — K5730 Diverticulosis of large intestine without perforation or abscess without bleeding: Secondary | ICD-10-CM | POA: Diagnosis not present

## 2015-06-02 DIAGNOSIS — D124 Benign neoplasm of descending colon: Secondary | ICD-10-CM | POA: Diagnosis not present

## 2015-06-02 DIAGNOSIS — K635 Polyp of colon: Secondary | ICD-10-CM | POA: Diagnosis not present

## 2015-06-02 DIAGNOSIS — K449 Diaphragmatic hernia without obstruction or gangrene: Secondary | ICD-10-CM | POA: Diagnosis not present

## 2015-06-02 DIAGNOSIS — D509 Iron deficiency anemia, unspecified: Secondary | ICD-10-CM | POA: Diagnosis not present

## 2015-06-02 DIAGNOSIS — K641 Second degree hemorrhoids: Secondary | ICD-10-CM | POA: Diagnosis not present

## 2015-06-02 DIAGNOSIS — K219 Gastro-esophageal reflux disease without esophagitis: Secondary | ICD-10-CM | POA: Diagnosis not present

## 2015-06-03 DIAGNOSIS — M25571 Pain in right ankle and joints of right foot: Secondary | ICD-10-CM | POA: Diagnosis not present

## 2015-06-03 DIAGNOSIS — M109 Gout, unspecified: Secondary | ICD-10-CM | POA: Diagnosis not present

## 2015-06-10 DIAGNOSIS — K641 Second degree hemorrhoids: Secondary | ICD-10-CM | POA: Diagnosis not present

## 2015-06-17 ENCOUNTER — Ambulatory Visit (INDEPENDENT_AMBULATORY_CARE_PROVIDER_SITE_OTHER): Payer: Medicare Other | Admitting: Podiatry

## 2015-06-17 ENCOUNTER — Encounter: Payer: Self-pay | Admitting: Podiatry

## 2015-06-17 DIAGNOSIS — M79676 Pain in unspecified toe(s): Secondary | ICD-10-CM | POA: Diagnosis not present

## 2015-06-17 DIAGNOSIS — B351 Tinea unguium: Secondary | ICD-10-CM

## 2015-06-17 NOTE — Patient Instructions (Signed)
Diabetes and Foot Care Diabetes may cause you to have problems because of poor blood supply (circulation) to your feet and legs. This may cause the skin on your feet to become thinner, break easier, and heal more slowly. Your skin may become dry, and the skin may peel and crack. You may also have nerve damage in your legs and feet causing decreased feeling in them. You may not notice minor injuries to your feet that could lead to infections or more serious problems. Taking care of your feet is one of the most important things you can do for yourself.  HOME CARE INSTRUCTIONS  Wear shoes at all times, even in the house. Do not go barefoot. Bare feet are easily injured.  Check your feet daily for blisters, cuts, and redness. If you cannot see the bottom of your feet, use a mirror or ask someone for help.  Wash your feet with warm water (do not use hot water) and mild soap. Then pat your feet and the areas between your toes until they are completely dry. Do not soak your feet as this can dry your skin.  Apply a moisturizing lotion or petroleum jelly (that does not contain alcohol and is unscented) to the skin on your feet and to dry, brittle toenails. Do not apply lotion between your toes.  Trim your toenails straight across. Do not dig under them or around the cuticle. File the edges of your nails with an emery board or nail file.  Do not cut corns or calluses or try to remove them with medicine.  Wear clean socks or stockings every day. Make sure they are not too tight. Do not wear knee-high stockings since they may decrease blood flow to your legs.  Wear shoes that fit properly and have enough cushioning. To break in new shoes, wear them for just a few hours a day. This prevents you from injuring your feet. Always look in your shoes before you put them on to be sure there are no objects inside.  Do not cross your legs. This may decrease the blood flow to your feet.  If you find a minor scrape,  cut, or break in the skin on your feet, keep it and the skin around it clean and dry. These areas may be cleansed with mild soap and water. Do not cleanse the area with peroxide, alcohol, or iodine.  When you remove an adhesive bandage, be sure not to damage the skin around it.  If you have a wound, look at it several times a day to make sure it is healing.  Do not use heating pads or hot water bottles. They may burn your skin. If you have lost feeling in your feet or legs, you may not know it is happening until it is too late.  Make sure your health care provider performs a complete foot exam at least annually or more often if you have foot problems. Report any cuts, sores, or bruises to your health care provider immediately. SEEK MEDICAL CARE IF:   You have an injury that is not healing.  You have cuts or breaks in the skin.  You have an ingrown nail.  You notice redness on your legs or feet.  You feel burning or tingling in your legs or feet.  You have pain or cramps in your legs and feet.  Your legs or feet are numb.  Your feet always feel cold. SEEK IMMEDIATE MEDICAL CARE IF:   There is increasing redness,   swelling, or pain in or around a wound.  There is a red line that goes up your leg.  Pus is coming from a wound.  You develop a fever or as directed by your health care provider.  You notice a bad smell coming from an ulcer or wound. Document Released: 10/14/2000 Document Revised: 06/19/2013 Document Reviewed: 03/26/2013 ExitCare Patient Information 2015 [employer]. This information is not intended to replace advice given to you by your health care provider. Make sure you discuss any questions you have with your health care provider.  

## 2015-06-18 NOTE — Progress Notes (Signed)
Patient ID: Autumn Chambers, female   DOB: Jan 22, 1941, 74 y.o.   MRN: [id number]  Subjective: This patient presents again today complaining of painful toenails and requests toenail debridement  Objective: The toenails are incurvated, brittle, discolored, elongated and tender direct palpation 6-10  Assessment: Symptomatic onychomycoses 6-10 Diabetic  Plan: Debridement of toenails 10 mechanically and electronically without any bleeding  Reappoint 3 months

## 2015-06-26 ENCOUNTER — Ambulatory Visit (INDEPENDENT_AMBULATORY_CARE_PROVIDER_SITE_OTHER): Payer: Medicare Other | Admitting: Cardiology

## 2015-06-26 ENCOUNTER — Encounter: Payer: Self-pay | Admitting: Cardiology

## 2015-06-26 VITALS — BP 138/74 | HR 89 | Ht 68.0 in | Wt 264.5 lb

## 2015-06-26 DIAGNOSIS — E119 Type 2 diabetes mellitus without complications: Secondary | ICD-10-CM

## 2015-06-26 DIAGNOSIS — I209 Angina pectoris, unspecified: Secondary | ICD-10-CM | POA: Diagnosis not present

## 2015-06-26 DIAGNOSIS — R0600 Dyspnea, unspecified: Secondary | ICD-10-CM

## 2015-06-26 DIAGNOSIS — Z87891 Personal history of nicotine dependence: Secondary | ICD-10-CM

## 2015-06-26 DIAGNOSIS — I10 Essential (primary) hypertension: Secondary | ICD-10-CM | POA: Diagnosis not present

## 2015-06-26 DIAGNOSIS — E669 Obesity, unspecified: Secondary | ICD-10-CM

## 2015-06-26 DIAGNOSIS — I350 Nonrheumatic aortic (valve) stenosis: Secondary | ICD-10-CM

## 2015-06-26 DIAGNOSIS — G4733 Obstructive sleep apnea (adult) (pediatric): Secondary | ICD-10-CM | POA: Insufficient documentation

## 2015-06-26 NOTE — Progress Notes (Signed)
Cardiology Office Note   Date:  06/26/2015   ID:  Autumn Chambers, DOB June 14, 1941, MRN [id number]  PCP:  Tawanna Solo, MD  Cardiologist:   Candee Furbish, MD       History of Present Illness: Autumn Chambers is a 74 y.o. female who presents for  Evaluation of dyspnea. Hemoglobin was noted to be 10.4, on iron supplementation. Lower extremity edema.     she saw Dr. Kathyrn Lass recently on 05/11/15 for a recheck on shortness of breath and he was not much better. She is tired after vacuuming 2 rooms. Winded.  No chest pain or pressure.   OSA - wearing CPAP.   Retired Manufacturing engineer.  ECHO 04/14/15: - Left ventricle: The cavity size was normal. Systolic function wasnormal. The estimated ejection fraction was in the range of 60%to 65%. Wall motion was normal; there were no regional wall motion abnormalities. Doppler parameters are consistent withabnormal left ventricular relaxation (grade 1 diastolic dysfunction). There was no evidence of elevated ventricular filling pressure by Doppler parameters. - Aortic valve: There was mild stenosis. Mean gradient (S): 8 mmHg. Valve area (VTI): 2.18 cm^2. Valve area (Vmean): 2.07 cm^2. - Mitral valve: Calcified annulus. There was no significant regurgitation. - Left atrium: The atrium was mildly dilated. - Right ventricle: Systolic function was normal. - Right atrium: The atrium was normal in size. - Tricuspid valve: There was no regurgitation. - Pulmonic valve: There was no regurgitation. - Pulmonary arteries: Systolic pressure was within the normalrange. - Inferior vena cava: The vessel was normal in size. - Pericardium, extracardiac: There was no pericardial effusion.  Past Medical History  Diagnosis Date  . Hypertension   . Hyperlipidemia   . Shortness of breath     "mild upon exertion"  . Diabetes mellitus     oral  . Hypothyroidism   . Anemia   . Urinary tract infection     hx of  . H/O hiatal hernia     . Irritable bowel   . Cancer     hx of skin ca  . Arthritis   . Osteoporosis 02/2013    T score -2.9  . PONV (postoperative nausea and vomiting)     yearsago  . Hearing loss   . Chronic kidney disease     CRI- sees Dr Posey Pronto  . Gout     Past Surgical History  Procedure Laterality Date  . Skin cancer excision      approx 4 years ago.  Nose  . Achilles tendon surgery    . Eye surgery      bilateral cataract surgery  . Tonsillectomy    . Dilation and curettage of uterus    . Breast lumpectomy      left, with 2 nodes removed  . Total knee arthroplasty  01/04/2012    Procedure: TOTAL KNEE ARTHROPLASTY;  Surgeon: Newt Minion, MD;  Location: [facility];  Service: Orthopedics;  Laterality: Right;  Right Total Knee Arthroplasty  . Total knee arthroplasty Left 07/31/2013    Procedure: LEFT TOTAL KNEE ARTHROPLASTY;  Surgeon: Newt Minion, MD;  Location: [facility];  Service: Orthopedics;  Laterality: Left;  Left Total Knee Arthroplasty     Current Outpatient Prescriptions  Medication Sig Dispense Refill  . alendronate (FOSAMAX) 70 MG tablet Take 70 mg by mouth every 7 (seven) days. Take with a full glass of water on an empty stomach.    Marland Kitchen allopurinol (ZYLOPRIM) 100 MG tablet     .  CALCIUM PO Take 1,500 mg by mouth 2 (two) times daily.    . diphenhydramine-acetaminophen (TYLENOL PM) 25-500 MG TABS Take 1 tablet by mouth at bedtime as needed. For sleep    . Glucosamine 500 MG CAPS Take 1 capsule by mouth 3 (three) times daily.    . irbesartan (AVAPRO) 300 MG tablet Take 300 mg by mouth at bedtime.    . pioglitazone (ACTOS) 30 MG tablet Take 30 mg by mouth daily.    . sitaGLIPtin (JANUVIA) 100 MG tablet Take 100 mg by mouth daily.    Marland Kitchen SYNTHROID 137 MCG tablet     . timolol (TIMOPTIC) 0.5 % ophthalmic solution     . triamterene-hydrochlorothiazide (MAXZIDE-25) 37.5-25 MG per tablet Take 1 tablet by mouth daily.     No current facility-administered medications for this visit.    Allergies:    Darvon    Social History:  The patient  reports that she has quit smoking. Her smoking use included Cigarettes. She quit after 40 years of use. She has never used smokeless tobacco. She reports that she does not drink alcohol or use illicit drugs. Smoked until she retired at year 52 of age.  Family History:  The patient's family history includes Cancer in her brother, father, and sister; Heart disease in her mother; Hypertension in her brother.    ROS:  Please see the history of present illness.   Otherwise, review of systems are positive for  Shortness of breath , hearing loss , leg edema.   All other systems are reviewed and negative.    PHYSICAL EXAM: VS:  BP 138/74 mmHg  Pulse 89  Ht 5\' 8"  (1.727 m)  Wt 264 lb 8 oz (119.976 kg)  BMI 40.23 kg/m2 , BMI Body mass index is 40.23 kg/(m^2). GEN: Well nourished, well developed, in no acute distress HEENT: normal Neck: no JVD, carotid bruits, or masses Cardiac: RRR; 2/6 systolic murmur RUSB, no rubs, or gallops,no edema  Respiratory:  clear to auscultation bilaterally, normal work of breathing GI: soft, nontender, nondistended, + BS MS: no deformity or atrophy Skin: warm and dry, no rash Neuro:  Strength and sensation are intact Psych: euthymic mood, full affect   EKG:  EKG is ordered today. The ekg ordered today demonstrates  06/26/15-sinus rhythm, 89, no other abnormalities.   Recent Labs: No results found for requested labs within last 365 days.   hemoglobin 10.4  Lipid Panel No results found for: CHOL, TRIG, HDL, CHOLHDL, VLDL, LDLCALC, LDLDIRECT    Wt Readings from Last 3 Encounters:  06/26/15 264 lb 8 oz (119.976 kg)  04/03/14 246 lb (111.585 kg)  01/09/14 246 lb (111.585 kg)      Other studies Reviewed: Additional studies/ records that were reviewed today include:  Prior office notes, lab work. Review of the above records demonstrates:  As above   ASSESSMENT AND PLAN:  1.   Dyspnea- certainly could be  multifactorial from deconditioning, obesity, anemia , former smoker for over 40 years. However given this recent change in symptoms and with her underlying diabetes which is a coronary artery disease equivalent, we will proceed with pharmacologic stress test. This could certainly be an anginal equivalent.  2. Obesity -  Continue to encourage weight loss   3. Obstructive sleep apnea-wears CPAP. Excellent.   4. Former smoker- several years. She amazed herself when she quit on her retirement date.  If stress test is reassuring, I would have her evaluated for COPD with PFTs.  last  chest x-ray on 07/23/13 showed no active pulmonary disease.   5. Aortic stenosis-mild in severity. Should be of no clinical consequence at this point.   6. Mild anemia- she is on iron supplementation. She states that she has had anemia off and on her entire life.   7. Diabetes -medications reviewed. Dr. Sabra Heck.   8. Essential hypertension -currently controlled, medications reviewed.   9. History of breast cancer -radiation 2009.   Current medicines are reviewed at length with the patient today.  The patient does not have concerns regarding medicines.  The following changes have been made:  no change  Labs/ tests ordered today include:   Orders Placed This Encounter  Procedures  . Myocardial Perfusion Imaging  . EKG 12-Lead     Disposition:   FU with Skains in 6 months,  However I will follow-up with stress test after it is performed.  Bobby Rumpf, MD  06/26/2015 12:11 PM    [facility] [address], [place], [place]  [address] Phone: [phone]; Fax: [phone]

## 2015-06-26 NOTE — Patient Instructions (Signed)
Medication Instructions:  Your physician recommends that you continue on your current medications as directed. Please refer to the Current Medication list given to you today.  Testing/Procedures: Your physician has requested that you have a lexiscan myoview. For further information please visit [email or web address]. Please follow instruction sheet, as given.  Follow-Up: Follow up in 6 months with Dr. Marlou Porch.  You will receive a letter in the mail 2 months before you are due.  Please call us when you receive this letter to schedule your follow up appointment.  Thank you for choosing [facility]!!

## 2015-06-29 ENCOUNTER — Telehealth (HOSPITAL_COMMUNITY): Payer: Self-pay

## 2015-06-29 NOTE — Telephone Encounter (Signed)
Patient given detailed instructions per Myocardial Perfusion Study Information Sheet for test on 07-01-2015 at 0915. Patient Notified to arrive 15 minutes early, and that it is imperative to arrive on time for appointment to keep from having the test rescheduled. Patient verbalized understanding. Oletta Lamas, Indiah Heyden A

## 2015-07-01 ENCOUNTER — Ambulatory Visit (HOSPITAL_COMMUNITY): Payer: Medicare Other | Attending: Cardiovascular Disease

## 2015-07-01 DIAGNOSIS — R002 Palpitations: Secondary | ICD-10-CM | POA: Diagnosis not present

## 2015-07-01 DIAGNOSIS — I209 Angina pectoris, unspecified: Secondary | ICD-10-CM | POA: Insufficient documentation

## 2015-07-01 DIAGNOSIS — R9439 Abnormal result of other cardiovascular function study: Secondary | ICD-10-CM | POA: Insufficient documentation

## 2015-07-01 DIAGNOSIS — I10 Essential (primary) hypertension: Secondary | ICD-10-CM

## 2015-07-01 DIAGNOSIS — E119 Type 2 diabetes mellitus without complications: Secondary | ICD-10-CM | POA: Diagnosis not present

## 2015-07-01 DIAGNOSIS — R0600 Dyspnea, unspecified: Secondary | ICD-10-CM | POA: Diagnosis not present

## 2015-07-01 MED ORDER — REGADENOSON 0.4 MG/5ML IV SOLN
0.4000 mg | Freq: Once | INTRAVENOUS | Status: AC
  Administered 2015-07-01: 0.4 mg via INTRAVENOUS

## 2015-07-01 MED ORDER — TECHNETIUM TC 99M SESTAMIBI GENERIC - CARDIOLITE
32.7000 | Freq: Once | INTRAVENOUS | Status: AC | PRN
  Administered 2015-07-01: 32.7 via INTRAVENOUS

## 2015-07-02 ENCOUNTER — Ambulatory Visit (HOSPITAL_COMMUNITY): Payer: Medicare Other | Attending: Cardiology

## 2015-07-02 LAB — MYOCARDIAL PERFUSION IMAGING
CHL CUP NUCLEAR SDS: 3
CHL CUP NUCLEAR SSS: 4
CHL CUP RESTING HR STRESS: 82 {beats}/min
CSEPPHR: 100 {beats}/min
LV dias vol: 92 mL
LVSYSVOL: 33 mL
RATE: 0.29
SRS: 1
TID: 0.87

## 2015-07-02 MED ORDER — TECHNETIUM TC 99M SESTAMIBI GENERIC - CARDIOLITE
31.6000 | Freq: Once | INTRAVENOUS | Status: AC | PRN
  Administered 2015-07-02: 32 via INTRAVENOUS

## 2015-07-08 DIAGNOSIS — K641 Second degree hemorrhoids: Secondary | ICD-10-CM | POA: Diagnosis not present

## 2015-07-22 DIAGNOSIS — K641 Second degree hemorrhoids: Secondary | ICD-10-CM | POA: Diagnosis not present

## 2015-08-13 DIAGNOSIS — E785 Hyperlipidemia, unspecified: Secondary | ICD-10-CM | POA: Diagnosis not present

## 2015-08-13 DIAGNOSIS — E039 Hypothyroidism, unspecified: Secondary | ICD-10-CM | POA: Diagnosis not present

## 2015-08-13 DIAGNOSIS — E1122 Type 2 diabetes mellitus with diabetic chronic kidney disease: Secondary | ICD-10-CM | POA: Diagnosis not present

## 2015-08-13 DIAGNOSIS — Z23 Encounter for immunization: Secondary | ICD-10-CM | POA: Diagnosis not present

## 2015-08-13 DIAGNOSIS — D649 Anemia, unspecified: Secondary | ICD-10-CM | POA: Diagnosis not present

## 2015-08-13 DIAGNOSIS — M810 Age-related osteoporosis without current pathological fracture: Secondary | ICD-10-CM | POA: Diagnosis not present

## 2015-08-13 DIAGNOSIS — E669 Obesity, unspecified: Secondary | ICD-10-CM | POA: Diagnosis not present

## 2015-08-13 DIAGNOSIS — I10 Essential (primary) hypertension: Secondary | ICD-10-CM | POA: Diagnosis not present

## 2015-09-02 DIAGNOSIS — H401131 Primary open-angle glaucoma, bilateral, mild stage: Secondary | ICD-10-CM | POA: Diagnosis not present

## 2015-09-03 DIAGNOSIS — G4733 Obstructive sleep apnea (adult) (pediatric): Secondary | ICD-10-CM | POA: Diagnosis not present

## 2015-09-15 DIAGNOSIS — N2581 Secondary hyperparathyroidism of renal origin: Secondary | ICD-10-CM | POA: Diagnosis not present

## 2015-09-15 DIAGNOSIS — N189 Chronic kidney disease, unspecified: Secondary | ICD-10-CM | POA: Diagnosis not present

## 2015-09-16 ENCOUNTER — Encounter: Payer: Self-pay | Admitting: Podiatry

## 2015-09-16 ENCOUNTER — Ambulatory Visit (INDEPENDENT_AMBULATORY_CARE_PROVIDER_SITE_OTHER): Payer: Medicare Other | Admitting: Podiatry

## 2015-09-16 DIAGNOSIS — B351 Tinea unguium: Secondary | ICD-10-CM

## 2015-09-16 DIAGNOSIS — M79676 Pain in unspecified toe(s): Secondary | ICD-10-CM

## 2015-09-16 NOTE — Patient Instructions (Signed)
Diabetes and Foot Care Diabetes may cause you to have problems because of poor blood supply (circulation) to your feet and legs. This may cause the skin on your feet to become thinner, break easier, and heal more slowly. Your skin may become dry, and the skin may peel and crack. You may also have nerve damage in your legs and feet causing decreased feeling in them. You may not notice minor injuries to your feet that could lead to infections or more serious problems. Taking care of your feet is one of the most important things you can do for yourself.  HOME CARE INSTRUCTIONS  Wear shoes at all times, even in the house. Do not go barefoot. Bare feet are easily injured.  Check your feet daily for blisters, cuts, and redness. If you cannot see the bottom of your feet, use a mirror or ask someone for help.  Wash your feet with warm water (do not use hot water) and mild soap. Then pat your feet and the areas between your toes until they are completely dry. Do not soak your feet as this can dry your skin.  Apply a moisturizing lotion or petroleum jelly (that does not contain alcohol and is unscented) to the skin on your feet and to dry, brittle toenails. Do not apply lotion between your toes.  Trim your toenails straight across. Do not dig under them or around the cuticle. File the edges of your nails with an emery board or nail file.  Do not cut corns or calluses or try to remove them with medicine.  Wear clean socks or stockings every day. Make sure they are not too tight. Do not wear knee-high stockings since they may decrease blood flow to your legs.  Wear shoes that fit properly and have enough cushioning. To break in new shoes, wear them for just a few hours a day. This prevents you from injuring your feet. Always look in your shoes before you put them on to be sure there are no objects inside.  Do not cross your legs. This may decrease the blood flow to your feet.  If you find a minor scrape,  cut, or break in the skin on your feet, keep it and the skin around it clean and dry. These areas may be cleansed with mild soap and water. Do not cleanse the area with peroxide, alcohol, or iodine.  When you remove an adhesive bandage, be sure not to damage the skin around it.  If you have a wound, look at it several times a day to make sure it is healing.  Do not use heating pads or hot water bottles. They may burn your skin. If you have lost feeling in your feet or legs, you may not know it is happening until it is too late.  Make sure your health care provider performs a complete foot exam at least annually or more often if you have foot problems. Report any cuts, sores, or bruises to your health care provider immediately. SEEK MEDICAL CARE IF:   You have an injury that is not healing.  You have cuts or breaks in the skin.  You have an ingrown nail.  You notice redness on your legs or feet.  You feel burning or tingling in your legs or feet.  You have pain or cramps in your legs and feet.  Your legs or feet are numb.  Your feet always feel cold. SEEK IMMEDIATE MEDICAL CARE IF:   There is increasing redness,   swelling, or pain in or around a wound.  There is a red line that goes up your leg.  Pus is coming from a wound.  You develop a fever or as directed by your health care provider.  You notice a bad smell coming from an ulcer or wound.   This information is not intended to replace advice given to you by your health care provider. Make sure you discuss any questions you have with your health care provider.   Document Released: 10/14/2000 Document Revised: 06/19/2013 Document Reviewed: 03/26/2013 Elsevier Interactive Patient Education 2016 [employer]vier Inc.  

## 2015-09-17 NOTE — Progress Notes (Signed)
Patient ID: Autumn Chambers, female   DOB: 1940/11/13, 74 y.o.   MRN: [id number]  Subjective: This patient presents for scheduled visit complaining of painful toenails and walking wearing screws and requests toenail debridement  Objective: No open skin lesions bilaterally The toenails are elongated, hypertrophic, discolored, deformed and tender to direct palpation 6-10  Assessment: Symptomatic onychomycoses 6-10 Diabetic  Plan: Debridement toenails 10 mechanically and electrically without any bleeding  Reappoint 3 months

## 2015-09-23 DIAGNOSIS — D631 Anemia in chronic kidney disease: Secondary | ICD-10-CM | POA: Diagnosis not present

## 2015-09-23 DIAGNOSIS — N189 Chronic kidney disease, unspecified: Secondary | ICD-10-CM | POA: Diagnosis not present

## 2015-09-23 DIAGNOSIS — I129 Hypertensive chronic kidney disease with stage 1 through stage 4 chronic kidney disease, or unspecified chronic kidney disease: Secondary | ICD-10-CM | POA: Diagnosis not present

## 2015-09-23 DIAGNOSIS — N2581 Secondary hyperparathyroidism of renal origin: Secondary | ICD-10-CM | POA: Diagnosis not present

## 2015-12-07 DIAGNOSIS — M109 Gout, unspecified: Secondary | ICD-10-CM | POA: Diagnosis not present

## 2015-12-07 DIAGNOSIS — M79671 Pain in right foot: Secondary | ICD-10-CM | POA: Diagnosis not present

## 2015-12-07 DIAGNOSIS — M1009 Idiopathic gout, multiple sites: Secondary | ICD-10-CM | POA: Diagnosis not present

## 2015-12-07 DIAGNOSIS — M25571 Pain in right ankle and joints of right foot: Secondary | ICD-10-CM | POA: Diagnosis not present

## 2015-12-23 ENCOUNTER — Ambulatory Visit (INDEPENDENT_AMBULATORY_CARE_PROVIDER_SITE_OTHER): Payer: Medicare Other | Admitting: Podiatry

## 2015-12-23 ENCOUNTER — Encounter: Payer: Self-pay | Admitting: Podiatry

## 2015-12-23 DIAGNOSIS — M79676 Pain in unspecified toe(s): Secondary | ICD-10-CM | POA: Diagnosis not present

## 2015-12-23 DIAGNOSIS — B351 Tinea unguium: Secondary | ICD-10-CM | POA: Diagnosis not present

## 2015-12-23 NOTE — Progress Notes (Signed)
Patient ID: Autumn Chambers, female   DOB: 1941-08-13, 75 y.o.   MRN: [id number]   Subjective: This patient presents for scheduled visit complaining of painful toenails and walking wearing screws and requests toenail debridement  Objective: No open skin lesions bilaterally The toenails are elongated, hypertrophic, discolored, deformed and tender to direct palpation 6-10  Assessment: Symptomatic onychomycoses 6-10 Diabetic  Plan: Debridement toenails 10 mechanically and electrically without any bleeding  Reappoint 3 months

## 2015-12-23 NOTE — Patient Instructions (Signed)
Diabetes and Foot Care Diabetes may cause you to have problems because of poor blood supply (circulation) to your feet and legs. This may cause the skin on your feet to become thinner, break easier, and heal more slowly. Your skin may become dry, and the skin may peel and crack. You may also have nerve damage in your legs and feet causing decreased feeling in them. You may not notice minor injuries to your feet that could lead to infections or more serious problems. Taking care of your feet is one of the most important things you can do for yourself.  HOME CARE INSTRUCTIONS  Wear shoes at all times, even in the house. Do not go barefoot. Bare feet are easily injured.  Check your feet daily for blisters, cuts, and redness. If you cannot see the bottom of your feet, use a mirror or ask someone for help.  Wash your feet with warm water (do not use hot water) and mild soap. Then pat your feet and the areas between your toes until they are completely dry. Do not soak your feet as this can dry your skin.  Apply a moisturizing lotion or petroleum jelly (that does not contain alcohol and is unscented) to the skin on your feet and to dry, brittle toenails. Do not apply lotion between your toes.  Trim your toenails straight across. Do not dig under them or around the cuticle. File the edges of your nails with an emery board or nail file.  Do not cut corns or calluses or try to remove them with medicine.  Wear clean socks or stockings every day. Make sure they are not too tight. Do not wear knee-high stockings since they may decrease blood flow to your legs.  Wear shoes that fit properly and have enough cushioning. To break in new shoes, wear them for just a few hours a day. This prevents you from injuring your feet. Always look in your shoes before you put them on to be sure there are no objects inside.  Do not cross your legs. This may decrease the blood flow to your feet.  If you find a minor scrape,  cut, or break in the skin on your feet, keep it and the skin around it clean and dry. These areas may be cleansed with mild soap and water. Do not cleanse the area with peroxide, alcohol, or iodine.  When you remove an adhesive bandage, be sure not to damage the skin around it.  If you have a wound, look at it several times a day to make sure it is healing.  Do not use heating pads or hot water bottles. They may burn your skin. If you have lost feeling in your feet or legs, you may not know it is happening until it is too late.  Make sure your health care provider performs a complete foot exam at least annually or more often if you have foot problems. Report any cuts, sores, or bruises to your health care provider immediately. SEEK MEDICAL CARE IF:   You have an injury that is not healing.  You have cuts or breaks in the skin.  You have an ingrown nail.  You notice redness on your legs or feet.  You feel burning or tingling in your legs or feet.  You have pain or cramps in your legs and feet.  Your legs or feet are numb.  Your feet always feel cold. SEEK IMMEDIATE MEDICAL CARE IF:   There is increasing redness,   swelling, or pain in or around a wound.  There is a red line that goes up your leg.  Pus is coming from a wound.  You develop a fever or as directed by your health care provider.  You notice a bad smell coming from an ulcer or wound.   This information is not intended to replace advice given to you by your health care provider. Make sure you discuss any questions you have with your health care provider.   Document Released: 10/14/2000 Document Revised: 06/19/2013 Document Reviewed: 03/26/2013 Elsevier Interactive Patient Education 2016 [employer]vier Inc.  

## 2016-01-20 DIAGNOSIS — L570 Actinic keratosis: Secondary | ICD-10-CM | POA: Diagnosis not present

## 2016-01-20 DIAGNOSIS — D485 Neoplasm of uncertain behavior of skin: Secondary | ICD-10-CM | POA: Diagnosis not present

## 2016-01-20 DIAGNOSIS — L821 Other seborrheic keratosis: Secondary | ICD-10-CM | POA: Diagnosis not present

## 2016-01-20 DIAGNOSIS — Z85828 Personal history of other malignant neoplasm of skin: Secondary | ICD-10-CM | POA: Diagnosis not present

## 2016-01-20 DIAGNOSIS — C44519 Basal cell carcinoma of skin of other part of trunk: Secondary | ICD-10-CM | POA: Diagnosis not present

## 2016-01-20 DIAGNOSIS — C4361 Malignant melanoma of right upper limb, including shoulder: Secondary | ICD-10-CM | POA: Diagnosis not present

## 2016-02-01 ENCOUNTER — Ambulatory Visit (INDEPENDENT_AMBULATORY_CARE_PROVIDER_SITE_OTHER): Payer: Medicare Other | Admitting: Cardiology

## 2016-02-01 ENCOUNTER — Encounter: Payer: Self-pay | Admitting: Cardiology

## 2016-02-01 VITALS — BP 132/70 | HR 90 | Ht 68.0 in | Wt 263.0 lb

## 2016-02-01 DIAGNOSIS — E669 Obesity, unspecified: Secondary | ICD-10-CM

## 2016-02-01 DIAGNOSIS — I10 Essential (primary) hypertension: Secondary | ICD-10-CM

## 2016-02-01 DIAGNOSIS — I350 Nonrheumatic aortic (valve) stenosis: Secondary | ICD-10-CM | POA: Diagnosis not present

## 2016-02-01 DIAGNOSIS — R0600 Dyspnea, unspecified: Secondary | ICD-10-CM

## 2016-02-01 DIAGNOSIS — Z87891 Personal history of nicotine dependence: Secondary | ICD-10-CM

## 2016-02-01 DIAGNOSIS — G4733 Obstructive sleep apnea (adult) (pediatric): Secondary | ICD-10-CM

## 2016-02-01 NOTE — Patient Instructions (Signed)
Medication Instructions:  The current medical regimen is effective;  continue present plan and medications.  Follow-Up: Follow up as needed with Dr Skains.  If you need a refill on your cardiac medications before your next appointment, please call your pharmacy.  Thank you for choosing [facility]!!     

## 2016-02-01 NOTE — Progress Notes (Signed)
Cardiology Office Note   Date:  02/01/2016   ID:  Autumn Chambers, DOB Mar 28, 1941, MRN [id number]  PCP:  Tawanna Solo, MD  Cardiologist:   Candee Furbish, MD       History of Present Illness: Autumn Chambers is a 75 y.o. female who follows up for dyspnea. Hemoglobin was noted to be 10.4, on iron supplementation. Lower extremity edema.    She saw Dr. Kathyrn Lass on 05/11/15 for a recheck on shortness of breath and she was not much better. She is tired after vacuuming 2 rooms. Winded.  No chest pain or pressure.   NUC stress: 07/02/15:   Nuclear stress EF: 65%.   Defect 1: There is a small defect of mild severity present in the mid anterior and apex location secondary to breast attenuation.   The study is normal.   This is a low risk study.  OSA - wearing CPAP.   Retired Manufacturing engineer.  ECHO 04/14/15: - Left ventricle: The cavity size was normal. Systolic function wasnormal. The estimated ejection fraction was in the range of 60%to 65%. Wall motion was normal; there were no regional wall motion abnormalities. Doppler parameters are consistent withabnormal left ventricular relaxation (grade 1 diastolic dysfunction). There was no evidence of elevated ventricular filling pressure by Doppler parameters. - Aortic valve: There was mild stenosis. Mean gradient (S): 8 mmHg. Valve area (VTI): 2.18 cm^2. Valve area (Vmean): 2.07 cm^2. - Mitral valve: Calcified annulus. There was no significant regurgitation. - Left atrium: The atrium was mildly dilated. - Right ventricle: Systolic function was normal. - Right atrium: The atrium was normal in size. - Tricuspid valve: There was no regurgitation. - Pulmonic valve: There was no regurgitation. - Pulmonary arteries: Systolic pressure was within the normalrange. - Inferior vena cava: The vessel was normal in size. - Pericardium, extracardiac: There was no pericardial effusion.   overall reassuring cardiac  workup. She does state that during days that are very humid, her breathing seems to be worse.  Past Medical History  Diagnosis Date  . Hypertension   . Hyperlipidemia   . Shortness of breath     "mild upon exertion"  . Diabetes mellitus     oral  . Hypothyroidism   . Anemia   . Urinary tract infection     hx of  . H/O hiatal hernia   . Irritable bowel   . Cancer ([facility])     hx of skin ca  . Arthritis   . Osteoporosis 02/2013    T score -2.9  . PONV (postoperative nausea and vomiting)     yearsago  . Hearing loss   . Chronic kidney disease     CRI- sees Dr Posey Pronto  . Gout     Past Surgical History  Procedure Laterality Date  . Skin cancer excision      approx 4 years ago.  Nose  . Achilles tendon surgery    . Eye surgery      bilateral cataract surgery  . Tonsillectomy    . Dilation and curettage of uterus    . Breast lumpectomy      left, with 2 nodes removed  . Total knee arthroplasty  01/04/2012    Procedure: TOTAL KNEE ARTHROPLASTY;  Surgeon: Newt Minion, MD;  Location: [facility];  Service: Orthopedics;  Laterality: Right;  Right Total Knee Arthroplasty  . Total knee arthroplasty Left 07/31/2013    Procedure: LEFT TOTAL KNEE ARTHROPLASTY;  Surgeon: Newt Minion, MD;  Location: [facility];  Service: Orthopedics;  Laterality: Left;  Left Total Knee Arthroplasty     Current Outpatient Prescriptions  Medication Sig Dispense Refill  . alendronate (FOSAMAX) 70 MG tablet Take 70 mg by mouth every 7 (seven) days. Take with a full glass of water on an empty stomach.    Marland Kitchen allopurinol (ZYLOPRIM) 100 MG tablet Take 100 mg by mouth daily.     Marland Kitchen CALCIUM PO Take 1,500 mg by mouth 2 (two) times daily.    . diphenhydramine-acetaminophen (TYLENOL PM) 25-500 MG TABS Take 1 tablet by mouth at bedtime as needed. For sleep    . irbesartan (AVAPRO) 300 MG tablet Take 300 mg by mouth at bedtime.    . pioglitazone (ACTOS) 30 MG tablet Take 30 mg by mouth daily.    . sitaGLIPtin (JANUVIA) 100 MG  tablet Take 100 mg by mouth daily.    Marland Kitchen SYNTHROID 137 MCG tablet Take 137 mcg by mouth daily before breakfast.     . timolol (TIMOPTIC) 0.5 % ophthalmic solution Place 1 drop into both eyes daily.     Marland Kitchen triamterene-hydrochlorothiazide (MAXZIDE-25) 37.5-25 MG per tablet Take 1 tablet by mouth daily.     No current facility-administered medications for this visit.    Allergies:   Darvon    Social History:  The patient  reports that she has quit smoking. Her smoking use included Cigarettes. She quit after 40 years of use. She has never used smokeless tobacco. She reports that she does not drink alcohol or use illicit drugs. Smoked until she retired at year 4 of age.  Family History:  The patient's family history includes Cancer in her brother, father, and sister; Heart disease in her mother; Hypertension in her brother.    ROS:  Please see the history of present illness.   Otherwise, review of systems are positive for  Shortness of breath , hearing loss , leg edema.   All other systems are reviewed and negative.    PHYSICAL EXAM: VS:  BP 132/70 mmHg  Pulse 90  Ht 5\' 8"  (1.727 m)  Wt 263 lb (119.296 kg)  BMI 40.00 kg/m2 , BMI Body mass index is 40 kg/(m^2). GEN: Well nourished, well developed, in no acute distress HEENT: normal Neck: no JVD, carotid bruits, or masses Cardiac: RRR; 2/6 systolic murmur RUSB, no rubs, or gallops,no edema  Respiratory:  clear to auscultation bilaterally, normal work of breathing GI: soft, nontender, nondistended, + BS MS: no deformity or atrophy Skin: warm and dry, no rash Neuro:  Strength and sensation are intact Psych: euthymic mood, full affect   EKG:  EKG is ordered today. The ekg ordered today demonstrates  06/26/15-sinus rhythm, 89, no other abnormalities.   Recent Labs: No results found for requested labs within last 365 days.   hemoglobin 10.4  Lipid Panel No results found for: CHOL, TRIG, HDL, CHOLHDL, VLDL, LDLCALC, LDLDIRECT    Wt  Readings from Last 3 Encounters:  02/01/16 263 lb (119.296 kg)  07/01/15 264 lb (119.75 kg)  06/26/15 264 lb 8 oz (119.976 kg)      Other studies Reviewed: Additional studies/ records that were reviewed today include:  Prior office notes, lab work. Review of the above records demonstrates:  As above   ASSESSMENT AND PLAN:  1.  Dyspnea- certainly could be multifactorial from deconditioning, obesity, anemia , former smoker for over 40 years. Stress test was reassuring. Echocardiogram reassuring. No further cardiac workup at this time. Continue to encourage exercise.  2. Obesity -  Continue to encourage weight loss  3. Obstructive sleep apnea-wears CPAP. Excellent.  4. Former smoker- several years. She amazed herself when she quit on her retirement date.  If stress test is reassuring, I would have her evaluated for COPD with PFTs.  last chest x-ray on 07/23/13 showed no active pulmonary disease.  5. Aortic stenosis-mild in severity. Should be of no clinical consequence at this point. Consider repeating echocardiogram in approximate 5 years.   6. Mild anemia- she is on iron supplementation. She states that she has had anemia off and on her entire life.   7. Diabetes -medications reviewed. Dr. Sabra Heck.   8. Essential hypertension -currently controlled, medications reviewed.   9. History of breast cancer -radiation 2009.   Current medicines are reviewed at length with the patient today.  The patient does not have concerns regarding medicines.  The following changes have been made:  no change  Labs/ tests ordered today include:   No orders of the defined types were placed in this encounter.     Disposition:   FU with Colie Josten in 6 months,    Signed, Candee Furbish, MD  02/01/2016 2:14 PM    [facility] [address], [place], [place]  [address] Phone: [phone]; Fax: [phone]

## 2016-02-11 DIAGNOSIS — G4733 Obstructive sleep apnea (adult) (pediatric): Secondary | ICD-10-CM | POA: Diagnosis not present

## 2016-02-11 DIAGNOSIS — I10 Essential (primary) hypertension: Secondary | ICD-10-CM | POA: Diagnosis not present

## 2016-02-11 DIAGNOSIS — E1122 Type 2 diabetes mellitus with diabetic chronic kidney disease: Secondary | ICD-10-CM | POA: Diagnosis not present

## 2016-02-11 DIAGNOSIS — Z7984 Long term (current) use of oral hypoglycemic drugs: Secondary | ICD-10-CM | POA: Diagnosis not present

## 2016-02-11 DIAGNOSIS — E039 Hypothyroidism, unspecified: Secondary | ICD-10-CM | POA: Diagnosis not present

## 2016-02-11 DIAGNOSIS — D649 Anemia, unspecified: Secondary | ICD-10-CM | POA: Diagnosis not present

## 2016-02-11 DIAGNOSIS — N183 Chronic kidney disease, stage 3 (moderate): Secondary | ICD-10-CM | POA: Diagnosis not present

## 2016-02-11 DIAGNOSIS — M810 Age-related osteoporosis without current pathological fracture: Secondary | ICD-10-CM | POA: Diagnosis not present

## 2016-02-19 DIAGNOSIS — D0361 Melanoma in situ of right upper limb, including shoulder: Secondary | ICD-10-CM | POA: Diagnosis not present

## 2016-02-19 DIAGNOSIS — C4361 Malignant melanoma of right upper limb, including shoulder: Secondary | ICD-10-CM | POA: Diagnosis not present

## 2016-03-09 DIAGNOSIS — E119 Type 2 diabetes mellitus without complications: Secondary | ICD-10-CM | POA: Diagnosis not present

## 2016-03-09 DIAGNOSIS — H401131 Primary open-angle glaucoma, bilateral, mild stage: Secondary | ICD-10-CM | POA: Diagnosis not present

## 2016-03-09 DIAGNOSIS — H353131 Nonexudative age-related macular degeneration, bilateral, early dry stage: Secondary | ICD-10-CM | POA: Diagnosis not present

## 2016-03-09 DIAGNOSIS — Z961 Presence of intraocular lens: Secondary | ICD-10-CM | POA: Diagnosis not present

## 2016-03-23 ENCOUNTER — Ambulatory Visit (INDEPENDENT_AMBULATORY_CARE_PROVIDER_SITE_OTHER): Payer: Medicare Other | Admitting: Podiatry

## 2016-03-23 ENCOUNTER — Encounter: Payer: Self-pay | Admitting: Podiatry

## 2016-03-23 DIAGNOSIS — B351 Tinea unguium: Secondary | ICD-10-CM | POA: Diagnosis not present

## 2016-03-23 DIAGNOSIS — M79676 Pain in unspecified toe(s): Secondary | ICD-10-CM | POA: Diagnosis not present

## 2016-03-23 NOTE — Patient Instructions (Signed)
Okay to use over-the-counter Fungi-Nail to apply topically to nails on a daily basis 6-12 months  Diabetes and Foot Care Diabetes may cause you to have problems because of poor blood supply (circulation) to your feet and legs. This may cause the skin on your feet to become thinner, break easier, and heal more slowly. Your skin may become dry, and the skin may peel and crack. You may also have nerve damage in your legs and feet causing decreased feeling in them. You may not notice minor injuries to your feet that could lead to infections or more serious problems. Taking care of your feet is one of the most important things you can do for yourself.  HOME CARE INSTRUCTIONS  Wear shoes at all times, even in the house. Do not go barefoot. Bare feet are easily injured.  Check your feet daily for blisters, cuts, and redness. If you cannot see the bottom of your feet, use a mirror or ask someone for help.  Wash your feet with warm water (do not use hot water) and mild soap. Then pat your feet and the areas between your toes until they are completely dry. Do not soak your feet as this can dry your skin.  Apply a moisturizing lotion or petroleum jelly (that does not contain alcohol and is unscented) to the skin on your feet and to dry, brittle toenails. Do not apply lotion between your toes.  Trim your toenails straight across. Do not dig under them or around the cuticle. File the edges of your nails with an emery board or nail file.  Do not cut corns or calluses or try to remove them with medicine.  Wear clean socks or stockings every day. Make sure they are not too tight. Do not wear knee-high stockings since they may decrease blood flow to your legs.  Wear shoes that fit properly and have enough cushioning. To break in new shoes, wear them for just a few hours a day. This prevents you from injuring your feet. Always look in your shoes before you put them on to be sure there are no objects inside.  Do  not cross your legs. This may decrease the blood flow to your feet.  If you find a minor scrape, cut, or break in the skin on your feet, keep it and the skin around it clean and dry. These areas may be cleansed with mild soap and water. Do not cleanse the area with peroxide, alcohol, or iodine.  When you remove an adhesive bandage, be sure not to damage the skin around it.  If you have a wound, look at it several times a day to make sure it is healing.  Do not use heating pads or hot water bottles. They may burn your skin. If you have lost feeling in your feet or legs, you may not know it is happening until it is too late.  Make sure your health care provider performs a complete foot exam at least annually or more often if you have foot problems. Report any cuts, sores, or bruises to your health care provider immediately. SEEK MEDICAL CARE IF:   You have an injury that is not healing.  You have cuts or breaks in the skin.  You have an ingrown nail.  You notice redness on your legs or feet.  You feel burning or tingling in your legs or feet.  You have pain or cramps in your legs and feet.  Your legs or feet are numb.  Your feet always feel cold. SEEK IMMEDIATE MEDICAL CARE IF:   There is increasing redness, swelling, or pain in or around a wound.  There is a red line that goes up your leg.  Pus is coming from a wound.  You develop a fever or as directed by your health care provider.  You notice a bad smell coming from an ulcer or wound.   This information is not intended to replace advice given to you by your health care provider. Make sure you discuss any questions you have with your health care provider.   Document Released: 10/14/2000 Document Revised: 06/19/2013 Document Reviewed: 03/26/2013 Elsevier Interactive Patient Education [employer].

## 2016-03-23 NOTE — Progress Notes (Signed)
Patient ID: Autumn Chambers, female   DOB: 1941-06-12, 75 y.o.   MRN: [id number]  Subjective: This patient presents for scheduled visit complaining of painful toenails and walking wearing screws and requests toenail debridement  Objective: No open skin lesions bilaterally The toenails are elongated, hypertrophic, discolored, deformed and tender to direct palpation 6-10  Assessment: Symptomatic onychomycoses 6-10 Diabetic  Plan: Debridement toenails 10 mechanically and electrically without any bleeding  Reappoint 3 months

## 2016-04-08 DIAGNOSIS — M25531 Pain in right wrist: Secondary | ICD-10-CM | POA: Diagnosis not present

## 2016-04-08 DIAGNOSIS — M25532 Pain in left wrist: Secondary | ICD-10-CM | POA: Diagnosis not present

## 2016-04-27 DIAGNOSIS — C44311 Basal cell carcinoma of skin of nose: Secondary | ICD-10-CM | POA: Diagnosis not present

## 2016-04-27 DIAGNOSIS — D485 Neoplasm of uncertain behavior of skin: Secondary | ICD-10-CM | POA: Diagnosis not present

## 2016-04-27 DIAGNOSIS — L570 Actinic keratosis: Secondary | ICD-10-CM | POA: Diagnosis not present

## 2016-04-27 DIAGNOSIS — C44519 Basal cell carcinoma of skin of other part of trunk: Secondary | ICD-10-CM | POA: Diagnosis not present

## 2016-04-29 DIAGNOSIS — M25532 Pain in left wrist: Secondary | ICD-10-CM | POA: Diagnosis not present

## 2016-04-29 DIAGNOSIS — M25531 Pain in right wrist: Secondary | ICD-10-CM | POA: Diagnosis not present

## 2016-05-16 DIAGNOSIS — N189 Chronic kidney disease, unspecified: Secondary | ICD-10-CM | POA: Diagnosis not present

## 2016-05-16 DIAGNOSIS — N2581 Secondary hyperparathyroidism of renal origin: Secondary | ICD-10-CM | POA: Diagnosis not present

## 2016-05-24 DIAGNOSIS — I129 Hypertensive chronic kidney disease with stage 1 through stage 4 chronic kidney disease, or unspecified chronic kidney disease: Secondary | ICD-10-CM | POA: Diagnosis not present

## 2016-05-24 DIAGNOSIS — N189 Chronic kidney disease, unspecified: Secondary | ICD-10-CM | POA: Diagnosis not present

## 2016-05-24 DIAGNOSIS — Z6841 Body Mass Index (BMI) 40.0 and over, adult: Secondary | ICD-10-CM | POA: Diagnosis not present

## 2016-05-24 DIAGNOSIS — D631 Anemia in chronic kidney disease: Secondary | ICD-10-CM | POA: Diagnosis not present

## 2016-05-24 DIAGNOSIS — N2581 Secondary hyperparathyroidism of renal origin: Secondary | ICD-10-CM | POA: Diagnosis not present

## 2016-06-01 ENCOUNTER — Ambulatory Visit: Payer: Medicare Other | Admitting: Podiatry

## 2016-06-06 DIAGNOSIS — M25571 Pain in right ankle and joints of right foot: Secondary | ICD-10-CM | POA: Diagnosis not present

## 2016-06-06 DIAGNOSIS — M79671 Pain in right foot: Secondary | ICD-10-CM | POA: Diagnosis not present

## 2016-06-22 ENCOUNTER — Encounter: Payer: Self-pay | Admitting: Podiatry

## 2016-06-22 ENCOUNTER — Ambulatory Visit (INDEPENDENT_AMBULATORY_CARE_PROVIDER_SITE_OTHER): Payer: Medicare Other | Admitting: Podiatry

## 2016-06-22 DIAGNOSIS — B351 Tinea unguium: Secondary | ICD-10-CM

## 2016-06-22 DIAGNOSIS — M79676 Pain in unspecified toe(s): Secondary | ICD-10-CM | POA: Diagnosis not present

## 2016-06-22 NOTE — Patient Instructions (Signed)
Diabetes and Foot Care Diabetes may cause you to have problems because of poor blood supply (circulation) to your feet and legs. This may cause the skin on your feet to become thinner, break easier, and heal more slowly. Your skin may become dry, and the skin may peel and crack. You may also have nerve damage in your legs and feet causing decreased feeling in them. You may not notice minor injuries to your feet that could lead to infections or more serious problems. Taking care of your feet is one of the most important things you can do for yourself.  HOME CARE INSTRUCTIONS  Wear shoes at all times, even in the house. Do not go barefoot. Bare feet are easily injured.  Check your feet daily for blisters, cuts, and redness. If you cannot see the bottom of your feet, use a mirror or ask someone for help.  Wash your feet with warm water (do not use hot water) and mild soap. Then pat your feet and the areas between your toes until they are completely dry. Do not soak your feet as this can dry your skin.  Apply a moisturizing lotion or petroleum jelly (that does not contain alcohol and is unscented) to the skin on your feet and to dry, brittle toenails. Do not apply lotion between your toes.  Trim your toenails straight across. Do not dig under them or around the cuticle. File the edges of your nails with an emery board or nail file.  Do not cut corns or calluses or try to remove them with medicine.  Wear clean socks or stockings every day. Make sure they are not too tight. Do not wear knee-high stockings since they may decrease blood flow to your legs.  Wear shoes that fit properly and have enough cushioning. To break in new shoes, wear them for just a few hours a day. This prevents you from injuring your feet. Always look in your shoes before you put them on to be sure there are no objects inside.  Do not cross your legs. This may decrease the blood flow to your feet.  If you find a minor scrape,  cut, or break in the skin on your feet, keep it and the skin around it clean and dry. These areas may be cleansed with mild soap and water. Do not cleanse the area with peroxide, alcohol, or iodine.  When you remove an adhesive bandage, be sure not to damage the skin around it.  If you have a wound, look at it several times a day to make sure it is healing.  Do not use heating pads or hot water bottles. They may burn your skin. If you have lost feeling in your feet or legs, you may not know it is happening until it is too late.  Make sure your health care provider performs a complete foot exam at least annually or more often if you have foot problems. Report any cuts, sores, or bruises to your health care provider immediately. SEEK MEDICAL CARE IF:   You have an injury that is not healing.  You have cuts or breaks in the skin.  You have an ingrown nail.  You notice redness on your legs or feet.  You feel burning or tingling in your legs or feet.  You have pain or cramps in your legs and feet.  Your legs or feet are numb.  Your feet always feel cold. SEEK IMMEDIATE MEDICAL CARE IF:   There is increasing redness,   swelling, or pain in or around a wound.  There is a red line that goes up your leg.  Pus is coming from a wound.  You develop a fever or as directed by your health care provider.  You notice a bad smell coming from an ulcer or wound.   This information is not intended to replace advice given to you by your health care provider. Make sure you discuss any questions you have with your health care provider.   Document Released: 10/14/2000 Document Revised: 06/19/2013 Document Reviewed: 03/26/2013 Elsevier Interactive Patient Education 2016 [employer]vier Inc.  

## 2016-06-22 NOTE — Progress Notes (Signed)
Patient ID: Autumn Chambers, female   DOB: [birth year], 75 y.o.   MRN: [id number]    Subjective: This patient presents for scheduled visit complaining of painful toenails and walking wearing screws and requests toenail debridement  Objective: No open skin lesions bilaterally The toenails are elongated, hypertrophic, discolored, deformed and tender to direct palpation 6-10 DP and PT pulses 2/4 bilaterally  Capillary Reflex immediate bilaterally Sensation to 10 g monofilament wire intact 3/5 right 4/5 left Vibratory sensation nonreactive bilaterally Ankle reflex equal and reactive bilaterally  Assessment: Symptomatic onychomycoses 6-10 Diabetic peripheral neuropathy  Plan: Debridement toenails 10 mechanically and electrically without any bleeding  Reappoint 3 months

## 2016-07-05 DIAGNOSIS — C44311 Basal cell carcinoma of skin of nose: Secondary | ICD-10-CM | POA: Diagnosis not present

## 2016-08-02 DIAGNOSIS — Z23 Encounter for immunization: Secondary | ICD-10-CM | POA: Diagnosis not present

## 2016-08-02 DIAGNOSIS — S76311A Strain of muscle, fascia and tendon of the posterior muscle group at thigh level, right thigh, initial encounter: Secondary | ICD-10-CM | POA: Diagnosis not present

## 2016-08-12 DIAGNOSIS — I10 Essential (primary) hypertension: Secondary | ICD-10-CM | POA: Diagnosis not present

## 2016-08-12 DIAGNOSIS — E1122 Type 2 diabetes mellitus with diabetic chronic kidney disease: Secondary | ICD-10-CM | POA: Diagnosis not present

## 2016-08-12 DIAGNOSIS — M810 Age-related osteoporosis without current pathological fracture: Secondary | ICD-10-CM | POA: Diagnosis not present

## 2016-08-12 DIAGNOSIS — N183 Chronic kidney disease, stage 3 (moderate): Secondary | ICD-10-CM | POA: Diagnosis not present

## 2016-08-12 DIAGNOSIS — E669 Obesity, unspecified: Secondary | ICD-10-CM | POA: Diagnosis not present

## 2016-08-12 DIAGNOSIS — Z6841 Body Mass Index (BMI) 40.0 and over, adult: Secondary | ICD-10-CM | POA: Diagnosis not present

## 2016-08-12 DIAGNOSIS — Z7984 Long term (current) use of oral hypoglycemic drugs: Secondary | ICD-10-CM | POA: Diagnosis not present

## 2016-08-12 DIAGNOSIS — E039 Hypothyroidism, unspecified: Secondary | ICD-10-CM | POA: Diagnosis not present

## 2016-09-05 DIAGNOSIS — H401122 Primary open-angle glaucoma, left eye, moderate stage: Secondary | ICD-10-CM | POA: Diagnosis not present

## 2016-09-05 DIAGNOSIS — H401111 Primary open-angle glaucoma, right eye, mild stage: Secondary | ICD-10-CM | POA: Diagnosis not present

## 2016-09-21 ENCOUNTER — Ambulatory Visit (INDEPENDENT_AMBULATORY_CARE_PROVIDER_SITE_OTHER): Payer: Medicare Other | Admitting: Podiatry

## 2016-09-21 ENCOUNTER — Encounter: Payer: Self-pay | Admitting: Podiatry

## 2016-09-21 VITALS — BP 143/76 | HR 84 | Resp 18

## 2016-09-21 DIAGNOSIS — B351 Tinea unguium: Secondary | ICD-10-CM

## 2016-09-21 DIAGNOSIS — M79676 Pain in unspecified toe(s): Secondary | ICD-10-CM | POA: Diagnosis not present

## 2016-09-21 NOTE — Patient Instructions (Signed)

## 2016-09-21 NOTE — Progress Notes (Signed)
Patient ID: Autumn Chambers, female   DOB: [birth year], 75 y.o.   MRN: [id number]    Subjective: This patient presents for scheduled visit complaining of painful toenails and walking wearing screws and requests toenail debridement  Objective: No open skin lesions bilaterally Atrophic skin bilaterally The toenails are elongated, hypertrophic, discolored, deformed and tender to direct palpation 6-10 Mild peripheral pitting edema bilaterally DP and PT pulses 2/4 bilaterally  Capillary Reflex immediate bilaterally Sensation to 10 g monofilament wire intact 3/5 right 4/5 left Vibratory sensation nonreactive bilaterally Ankle reflex equal and reactive bilaterally  Assessment: Symptomatic onychomycoses 6-10 Diabetic peripheral neuropathy  Plan: Debridement toenails 10 mechanically and electrically without any bleeding  Reappoint 3 months

## 2016-09-27 DIAGNOSIS — G4733 Obstructive sleep apnea (adult) (pediatric): Secondary | ICD-10-CM | POA: Diagnosis not present

## 2016-11-14 DIAGNOSIS — N2581 Secondary hyperparathyroidism of renal origin: Secondary | ICD-10-CM | POA: Diagnosis not present

## 2016-11-14 DIAGNOSIS — N189 Chronic kidney disease, unspecified: Secondary | ICD-10-CM | POA: Diagnosis not present

## 2016-11-22 DIAGNOSIS — N2581 Secondary hyperparathyroidism of renal origin: Secondary | ICD-10-CM | POA: Diagnosis not present

## 2016-11-22 DIAGNOSIS — N183 Chronic kidney disease, stage 3 (moderate): Secondary | ICD-10-CM | POA: Diagnosis not present

## 2016-11-22 DIAGNOSIS — D631 Anemia in chronic kidney disease: Secondary | ICD-10-CM | POA: Diagnosis not present

## 2016-11-22 DIAGNOSIS — I129 Hypertensive chronic kidney disease with stage 1 through stage 4 chronic kidney disease, or unspecified chronic kidney disease: Secondary | ICD-10-CM | POA: Diagnosis not present

## 2016-11-22 DIAGNOSIS — Z6841 Body Mass Index (BMI) 40.0 and over, adult: Secondary | ICD-10-CM | POA: Diagnosis not present

## 2016-12-12 ENCOUNTER — Ambulatory Visit (INDEPENDENT_AMBULATORY_CARE_PROVIDER_SITE_OTHER): Payer: Medicare Other | Admitting: Family

## 2016-12-12 DIAGNOSIS — M1A00X0 Idiopathic chronic gout, unspecified site, without tophus (tophi): Secondary | ICD-10-CM | POA: Diagnosis not present

## 2016-12-12 LAB — HEPATIC FUNCTION PANEL
ALK PHOS: 64 U/L (ref 33–130)
ALT: 11 U/L (ref 6–29)
AST: 17 U/L (ref 10–35)
Albumin: 3.7 g/dL (ref 3.6–5.1)
BILIRUBIN TOTAL: 0.4 mg/dL (ref 0.2–1.2)
Bilirubin, Direct: 0.1 mg/dL (ref <=0.2)
Indirect Bilirubin: 0.3 mg/dL (ref 0.2–1.2)
Total Protein: 6.8 g/dL (ref 6.1–8.1)

## 2016-12-12 NOTE — Progress Notes (Signed)
Office Visit Note   Patient: Autumn Chambers           Date of Birth: [birth year]           MRN: [id number] Visit Date: 12/12/2016              Requested by: Kathyrn Lass, MD [place], [place] [id number] PCP: Tawanna Solo, MD  No chief complaint on file.   HPI: The patient is a 76 year old woman who presents in follow up for recheck of her gout.  She previously has had pain in her ankles with gout flares.  She states that she is asymptomatic at this time.  She is taking allopurinol.  Cannot remember her last flare. Is well controlled.     Assessment & Plan: Visit Diagnoses:  1. Idiopathic chronic gout without tophus, unspecified site     Plan: continue allopurinol. Follow-up in office as needed. We will call her with lab results and adjust her medications accordingly.  Follow-Up Instructions: Return in about 6 months (around 06/11/2017).   Ortho Exam Physical Exam  Constitutional: Appears well-developed. Steady gait. No assistive devices. Head: Normocephalic.  Eyes: EOM are normal.  Neck: Normal range of motion.  Cardiovascular: Normal rate.   Pulmonary/Chest: Effort normal.  Neurological: Is alert.  Skin: Skin is warm.  Psychiatric: Has a normal mood and affect. Musculoskeletal: No joint swelling. No erythema. No tenderness. Painless and free range of motion ankle joints.   Imaging: No results found.  Orders:  No orders of the defined types were placed in this encounter.  No orders of the defined types were placed in this encounter.    Procedures: No procedures performed  Clinical Data: No additional findings.  Subjective: Review of Systems  Constitutional: Negative for chills and fever.  Cardiovascular: Negative for leg swelling.  Musculoskeletal: Negative for arthralgias, gait problem and joint swelling.    Objective: Vital Signs: There were no vitals taken for this visit.  Specialty Comments:  No specialty comments  available.  PMFS History: Patient Active Problem List   Diagnosis Date Noted  . Idiopathic chronic gout, unspecified site, without tophus (tophi) 12/12/2016  . Dyspnea 06/26/2015  . Angina pectoris ([place]) 06/26/2015  . Former smoker 06/26/2015  . Obesity 06/26/2015  . Type 2 diabetes mellitus without complication ([place]) 123456  . OSA (obstructive sleep apnea) 06/26/2015  . Aortic stenosis 06/26/2015  . Breast cancer of upper-inner quadrant of left female breast ([place]) 09/05/2013  . Osteoporosis 08/22/2013  . GERD (gastroesophageal reflux disease) 08/22/2013  . Hypothyroidism 08/22/2013  . Anxiety 08/22/2013  . Hypertension 08/22/2013  . Diabetes due to undrl condition w oth diabetic kidney comp ([place]) 08/22/2013  . Osteoarthritis of left knee 08/02/2013    Class: Diagnosis of   Past Medical History:  Diagnosis Date  . Anemia   . Arthritis   . Cancer ([place])    hx of skin ca  . Chronic kidney disease    CRI- sees Dr Posey Pronto  . Diabetes mellitus    oral  . Gout   . H/O hiatal hernia   . Hearing loss   . Hyperlipidemia   . Hypertension   . Hypothyroidism   . Irritable bowel   . Osteoporosis 02/2013   T score -2.9  . PONV (postoperative nausea and vomiting)    yearsago  . Shortness of breath    "mild upon exertion"  . Urinary tract infection    hx of    Family History  Problem Relation Age of Onset  . Cancer Father     Prostate  . Cancer Sister     Pancreatic cancer  . Cancer Brother     Brain cancer  . Hypertension Brother   . Heart disease Mother     Past Surgical History:  Procedure Laterality Date  . ACHILLES TENDON SURGERY    . BREAST LUMPECTOMY     left, with 2 nodes removed  . DILATION AND CURETTAGE OF UTERUS    . EYE SURGERY     bilateral cataract surgery  . SKIN CANCER EXCISION     approx 4 years ago.  Nose  . TONSILLECTOMY    . TOTAL KNEE ARTHROPLASTY  01/04/2012   Procedure: TOTAL KNEE ARTHROPLASTY;  Surgeon: Newt Minion, MD;  Location: [facility];   Service: Orthopedics;  Laterality: Right;  Right Total Knee Arthroplasty  . TOTAL KNEE ARTHROPLASTY Left 07/31/2013   Procedure: LEFT TOTAL KNEE ARTHROPLASTY;  Surgeon: Newt Minion, MD;  Location: [facility];  Service: Orthopedics;  Laterality: Left;  Left Total Knee Arthroplasty   Social History   Occupational History  . Not on file.   Social History Main Topics  . Smoking status: Former Smoker    Years: 40.00    Types: Cigarettes  . Smokeless tobacco: Never Used     Comment: stopped smoking 2009  . Alcohol use No  . Drug use: No  . Sexual activity: Not on file

## 2016-12-13 LAB — URIC ACID: Uric Acid, Serum: 6.3 mg/dL (ref 2.5–7.0)

## 2016-12-28 ENCOUNTER — Ambulatory Visit (INDEPENDENT_AMBULATORY_CARE_PROVIDER_SITE_OTHER): Payer: Medicare Other | Admitting: Podiatry

## 2016-12-28 ENCOUNTER — Encounter: Payer: Self-pay | Admitting: Podiatry

## 2016-12-28 VITALS — BP 88/56 | HR 86 | Resp 18

## 2016-12-28 DIAGNOSIS — B351 Tinea unguium: Secondary | ICD-10-CM

## 2016-12-28 DIAGNOSIS — E1142 Type 2 diabetes mellitus with diabetic polyneuropathy: Secondary | ICD-10-CM

## 2016-12-28 DIAGNOSIS — M79676 Pain in unspecified toe(s): Secondary | ICD-10-CM | POA: Diagnosis not present

## 2016-12-28 NOTE — Progress Notes (Signed)
Patient ID: Autumn Chambers, female   DOB: Nov 20, 1940, 76 y.o.   MRN: [id number]    Subjective: This patient presents for scheduled visit complaining of painful toenails and walking wearing screws and requests toenail debridement  Objective: No open skin lesions bilaterally Atrophic skin bilaterally The toenails are elongated, hypertrophic, discolored, deformed and tender to direct palpation 6-10 Mild peripheral pitting edema bilaterally DP and PT pulses 2/4 bilaterally Capillary Reflex immediate bilaterally Sensation to 10 g monofilament wire intact 3/5 right 4/5 left Vibratory sensation nonreactive bilaterally Ankle reflex equal and reactive bilaterally Manual motor testing dorsi flexion, plantar flexion 5/5 bilaterally  Assessment: Symptomatic onychomycoses 6-10 Diabeticperipheral neuropathy  Plan: Debridement toenails 10 mechanically and electrically without any bleeding  Reappoint 3 months

## 2016-12-28 NOTE — Patient Instructions (Signed)

## 2017-01-19 DIAGNOSIS — L821 Other seborrheic keratosis: Secondary | ICD-10-CM | POA: Diagnosis not present

## 2017-01-19 DIAGNOSIS — L570 Actinic keratosis: Secondary | ICD-10-CM | POA: Diagnosis not present

## 2017-01-19 DIAGNOSIS — D1801 Hemangioma of skin and subcutaneous tissue: Secondary | ICD-10-CM | POA: Diagnosis not present

## 2017-01-19 DIAGNOSIS — Z85820 Personal history of malignant melanoma of skin: Secondary | ICD-10-CM | POA: Diagnosis not present

## 2017-01-19 DIAGNOSIS — L814 Other melanin hyperpigmentation: Secondary | ICD-10-CM | POA: Diagnosis not present

## 2017-01-19 DIAGNOSIS — Z85828 Personal history of other malignant neoplasm of skin: Secondary | ICD-10-CM | POA: Diagnosis not present

## 2017-02-10 DIAGNOSIS — M810 Age-related osteoporosis without current pathological fracture: Secondary | ICD-10-CM | POA: Diagnosis not present

## 2017-02-10 DIAGNOSIS — Z86010 Personal history of colonic polyps: Secondary | ICD-10-CM | POA: Diagnosis not present

## 2017-02-10 DIAGNOSIS — M1000 Idiopathic gout, unspecified site: Secondary | ICD-10-CM | POA: Diagnosis not present

## 2017-02-10 DIAGNOSIS — N183 Chronic kidney disease, stage 3 (moderate): Secondary | ICD-10-CM | POA: Diagnosis not present

## 2017-02-10 DIAGNOSIS — E669 Obesity, unspecified: Secondary | ICD-10-CM | POA: Diagnosis not present

## 2017-02-10 DIAGNOSIS — E785 Hyperlipidemia, unspecified: Secondary | ICD-10-CM | POA: Diagnosis not present

## 2017-02-10 DIAGNOSIS — E039 Hypothyroidism, unspecified: Secondary | ICD-10-CM | POA: Diagnosis not present

## 2017-02-10 DIAGNOSIS — G4733 Obstructive sleep apnea (adult) (pediatric): Secondary | ICD-10-CM | POA: Diagnosis not present

## 2017-02-10 DIAGNOSIS — E1122 Type 2 diabetes mellitus with diabetic chronic kidney disease: Secondary | ICD-10-CM | POA: Diagnosis not present

## 2017-02-10 DIAGNOSIS — D631 Anemia in chronic kidney disease: Secondary | ICD-10-CM | POA: Diagnosis not present

## 2017-02-10 DIAGNOSIS — I10 Essential (primary) hypertension: Secondary | ICD-10-CM | POA: Diagnosis not present

## 2017-02-10 DIAGNOSIS — H9113 Presbycusis, bilateral: Secondary | ICD-10-CM | POA: Diagnosis not present

## 2017-03-08 DIAGNOSIS — I10 Essential (primary) hypertension: Secondary | ICD-10-CM | POA: Diagnosis not present

## 2017-03-08 DIAGNOSIS — N183 Chronic kidney disease, stage 3 (moderate): Secondary | ICD-10-CM | POA: Diagnosis not present

## 2017-03-20 DIAGNOSIS — H401131 Primary open-angle glaucoma, bilateral, mild stage: Secondary | ICD-10-CM | POA: Diagnosis not present

## 2017-03-20 DIAGNOSIS — H52203 Unspecified astigmatism, bilateral: Secondary | ICD-10-CM | POA: Diagnosis not present

## 2017-03-20 DIAGNOSIS — E119 Type 2 diabetes mellitus without complications: Secondary | ICD-10-CM | POA: Diagnosis not present

## 2017-03-29 ENCOUNTER — Encounter: Payer: Self-pay | Admitting: Podiatry

## 2017-03-29 ENCOUNTER — Ambulatory Visit (INDEPENDENT_AMBULATORY_CARE_PROVIDER_SITE_OTHER): Payer: Medicare Other | Admitting: Podiatry

## 2017-03-29 VITALS — BP 150/74 | HR 81 | Resp 18

## 2017-03-29 DIAGNOSIS — B351 Tinea unguium: Secondary | ICD-10-CM | POA: Diagnosis not present

## 2017-03-29 DIAGNOSIS — E1142 Type 2 diabetes mellitus with diabetic polyneuropathy: Secondary | ICD-10-CM | POA: Diagnosis not present

## 2017-03-29 DIAGNOSIS — M79676 Pain in unspecified toe(s): Secondary | ICD-10-CM

## 2017-03-29 NOTE — Patient Instructions (Signed)

## 2017-03-29 NOTE — Progress Notes (Signed)
Patient ID: Autumn Chambers, female   DOB: 1941-09-05, 76 y.o.   MRN: [id number]    Subjective: This patient presents for scheduled visit complaining of painful toenails and walking wearing screws and requests toenail debridement  Objective: No open skin lesions bilaterally Atrophic skin and absent  bilaterally The toenails are elongated, hypertrophic, discolored, deformed and tender to direct palpation 6-10 Mild peripheral pitting edema bilaterally DP and PT pulses 2/4 bilaterally Capillary Reflex immediate bilaterally Sensation to 10 g monofilament wire intact 3/5 right 4/5 left Vibratory sensation nonreactive bilaterally Ankle reflex equal and reactive bilaterally Manual motor testing dorsi flexion, plantar flexion 5/5 bilaterally  Assessment: Symptomatic onychomycoses 6-10 Diabeticperipheral neuropathy  Plan: Debridement toenails 10 mechanically and electrically without any bleeding  Reappoint 3 months

## 2017-06-12 ENCOUNTER — Ambulatory Visit (INDEPENDENT_AMBULATORY_CARE_PROVIDER_SITE_OTHER): Payer: Medicare Other | Admitting: Orthopedic Surgery

## 2017-07-05 ENCOUNTER — Ambulatory Visit: Payer: Medicare Other | Admitting: Podiatry

## 2017-07-10 ENCOUNTER — Encounter: Payer: Self-pay | Admitting: Podiatry

## 2017-07-10 ENCOUNTER — Ambulatory Visit (INDEPENDENT_AMBULATORY_CARE_PROVIDER_SITE_OTHER): Payer: Medicare Other | Admitting: Podiatry

## 2017-07-10 DIAGNOSIS — M79676 Pain in unspecified toe(s): Secondary | ICD-10-CM | POA: Diagnosis not present

## 2017-07-10 DIAGNOSIS — B351 Tinea unguium: Secondary | ICD-10-CM

## 2017-07-10 DIAGNOSIS — E1142 Type 2 diabetes mellitus with diabetic polyneuropathy: Secondary | ICD-10-CM

## 2017-07-10 NOTE — Patient Instructions (Signed)

## 2017-07-10 NOTE — Progress Notes (Signed)
Patient ID: Autumn Chambers, female   DOB: Mar 07, 1941, 76 y.o.   MRN: [id number]     Subjective: This patient presents for scheduled visit complaining of painful toenails and walking wearing screws and requests toenail debridement  Objective: No open skin lesions bilaterally Atrophic skin and absent  bilaterally The toenails are elongated, hypertrophic, discolored, deformed and tender to direct palpation 6-10 Mild peripheral pitting edema bilaterally Atrophic skin with absent hair growth bilaterally DP and PT pulses 2/4 bilaterally Capillary Reflex immediate bilaterally Sensation to 10 g monofilament wire intact 3/5 right 4/5 left Vibratory sensation nonreactive bilaterally Ankle reflex equal and reactive bilaterally Manual motor testing dorsi flexion, plantar flexion 5/5 bilaterally  Assessment: Symptomatic onychomycoses 6-10 Diabeticperipheral neuropathy  Plan: Debridement toenails 10 mechanically and electrically without any bleeding  Reappoint 3 months

## 2017-07-27 DIAGNOSIS — C44329 Squamous cell carcinoma of skin of other parts of face: Secondary | ICD-10-CM | POA: Diagnosis not present

## 2017-07-27 DIAGNOSIS — L570 Actinic keratosis: Secondary | ICD-10-CM | POA: Diagnosis not present

## 2017-07-27 DIAGNOSIS — D1801 Hemangioma of skin and subcutaneous tissue: Secondary | ICD-10-CM | POA: Diagnosis not present

## 2017-07-27 DIAGNOSIS — Z85820 Personal history of malignant melanoma of skin: Secondary | ICD-10-CM | POA: Diagnosis not present

## 2017-07-27 DIAGNOSIS — Z85828 Personal history of other malignant neoplasm of skin: Secondary | ICD-10-CM | POA: Diagnosis not present

## 2017-07-27 DIAGNOSIS — D485 Neoplasm of uncertain behavior of skin: Secondary | ICD-10-CM | POA: Diagnosis not present

## 2017-07-27 DIAGNOSIS — L814 Other melanin hyperpigmentation: Secondary | ICD-10-CM | POA: Diagnosis not present

## 2017-07-27 DIAGNOSIS — L821 Other seborrheic keratosis: Secondary | ICD-10-CM | POA: Diagnosis not present

## 2017-08-15 DIAGNOSIS — Z23 Encounter for immunization: Secondary | ICD-10-CM | POA: Diagnosis not present

## 2017-09-05 DIAGNOSIS — R2689 Other abnormalities of gait and mobility: Secondary | ICD-10-CM | POA: Diagnosis not present

## 2017-09-05 DIAGNOSIS — E1122 Type 2 diabetes mellitus with diabetic chronic kidney disease: Secondary | ICD-10-CM | POA: Diagnosis not present

## 2017-09-11 DIAGNOSIS — R531 Weakness: Secondary | ICD-10-CM | POA: Diagnosis not present

## 2017-09-11 DIAGNOSIS — M6281 Muscle weakness (generalized): Secondary | ICD-10-CM | POA: Diagnosis not present

## 2017-09-11 DIAGNOSIS — R262 Difficulty in walking, not elsewhere classified: Secondary | ICD-10-CM | POA: Diagnosis not present

## 2017-09-11 DIAGNOSIS — M6250 Muscle wasting and atrophy, not elsewhere classified, unspecified site: Secondary | ICD-10-CM | POA: Diagnosis not present

## 2017-09-13 DIAGNOSIS — R262 Difficulty in walking, not elsewhere classified: Secondary | ICD-10-CM | POA: Diagnosis not present

## 2017-09-13 DIAGNOSIS — M6281 Muscle weakness (generalized): Secondary | ICD-10-CM | POA: Diagnosis not present

## 2017-09-13 DIAGNOSIS — R531 Weakness: Secondary | ICD-10-CM | POA: Diagnosis not present

## 2017-09-13 DIAGNOSIS — M6250 Muscle wasting and atrophy, not elsewhere classified, unspecified site: Secondary | ICD-10-CM | POA: Diagnosis not present

## 2017-09-15 DIAGNOSIS — M6250 Muscle wasting and atrophy, not elsewhere classified, unspecified site: Secondary | ICD-10-CM | POA: Diagnosis not present

## 2017-09-15 DIAGNOSIS — R531 Weakness: Secondary | ICD-10-CM | POA: Diagnosis not present

## 2017-09-15 DIAGNOSIS — R262 Difficulty in walking, not elsewhere classified: Secondary | ICD-10-CM | POA: Diagnosis not present

## 2017-09-15 DIAGNOSIS — M6281 Muscle weakness (generalized): Secondary | ICD-10-CM | POA: Diagnosis not present

## 2017-09-17 DIAGNOSIS — R531 Weakness: Secondary | ICD-10-CM | POA: Diagnosis not present

## 2017-09-17 DIAGNOSIS — M6281 Muscle weakness (generalized): Secondary | ICD-10-CM | POA: Diagnosis not present

## 2017-09-17 DIAGNOSIS — R262 Difficulty in walking, not elsewhere classified: Secondary | ICD-10-CM | POA: Diagnosis not present

## 2017-09-17 DIAGNOSIS — M6250 Muscle wasting and atrophy, not elsewhere classified, unspecified site: Secondary | ICD-10-CM | POA: Diagnosis not present

## 2017-09-18 DIAGNOSIS — M6250 Muscle wasting and atrophy, not elsewhere classified, unspecified site: Secondary | ICD-10-CM | POA: Diagnosis not present

## 2017-09-18 DIAGNOSIS — R531 Weakness: Secondary | ICD-10-CM | POA: Diagnosis not present

## 2017-09-18 DIAGNOSIS — M6281 Muscle weakness (generalized): Secondary | ICD-10-CM | POA: Diagnosis not present

## 2017-09-18 DIAGNOSIS — R262 Difficulty in walking, not elsewhere classified: Secondary | ICD-10-CM | POA: Diagnosis not present

## 2017-09-20 DIAGNOSIS — R531 Weakness: Secondary | ICD-10-CM | POA: Diagnosis not present

## 2017-09-20 DIAGNOSIS — M6250 Muscle wasting and atrophy, not elsewhere classified, unspecified site: Secondary | ICD-10-CM | POA: Diagnosis not present

## 2017-09-20 DIAGNOSIS — M6281 Muscle weakness (generalized): Secondary | ICD-10-CM | POA: Diagnosis not present

## 2017-09-20 DIAGNOSIS — R262 Difficulty in walking, not elsewhere classified: Secondary | ICD-10-CM | POA: Diagnosis not present

## 2017-09-25 DIAGNOSIS — M6250 Muscle wasting and atrophy, not elsewhere classified, unspecified site: Secondary | ICD-10-CM | POA: Diagnosis not present

## 2017-09-25 DIAGNOSIS — M6281 Muscle weakness (generalized): Secondary | ICD-10-CM | POA: Diagnosis not present

## 2017-09-25 DIAGNOSIS — R262 Difficulty in walking, not elsewhere classified: Secondary | ICD-10-CM | POA: Diagnosis not present

## 2017-09-25 DIAGNOSIS — R531 Weakness: Secondary | ICD-10-CM | POA: Diagnosis not present

## 2017-09-27 DIAGNOSIS — R262 Difficulty in walking, not elsewhere classified: Secondary | ICD-10-CM | POA: Diagnosis not present

## 2017-09-27 DIAGNOSIS — M6250 Muscle wasting and atrophy, not elsewhere classified, unspecified site: Secondary | ICD-10-CM | POA: Diagnosis not present

## 2017-09-27 DIAGNOSIS — R531 Weakness: Secondary | ICD-10-CM | POA: Diagnosis not present

## 2017-09-27 DIAGNOSIS — M6281 Muscle weakness (generalized): Secondary | ICD-10-CM | POA: Diagnosis not present

## 2017-09-29 DIAGNOSIS — M6250 Muscle wasting and atrophy, not elsewhere classified, unspecified site: Secondary | ICD-10-CM | POA: Diagnosis not present

## 2017-09-29 DIAGNOSIS — R262 Difficulty in walking, not elsewhere classified: Secondary | ICD-10-CM | POA: Diagnosis not present

## 2017-09-29 DIAGNOSIS — R531 Weakness: Secondary | ICD-10-CM | POA: Diagnosis not present

## 2017-09-29 DIAGNOSIS — M6281 Muscle weakness (generalized): Secondary | ICD-10-CM | POA: Diagnosis not present

## 2017-10-02 DIAGNOSIS — M6250 Muscle wasting and atrophy, not elsewhere classified, unspecified site: Secondary | ICD-10-CM | POA: Diagnosis not present

## 2017-10-02 DIAGNOSIS — R531 Weakness: Secondary | ICD-10-CM | POA: Diagnosis not present

## 2017-10-02 DIAGNOSIS — M6281 Muscle weakness (generalized): Secondary | ICD-10-CM | POA: Diagnosis not present

## 2017-10-02 DIAGNOSIS — R262 Difficulty in walking, not elsewhere classified: Secondary | ICD-10-CM | POA: Diagnosis not present

## 2017-10-04 DIAGNOSIS — R262 Difficulty in walking, not elsewhere classified: Secondary | ICD-10-CM | POA: Diagnosis not present

## 2017-10-04 DIAGNOSIS — R531 Weakness: Secondary | ICD-10-CM | POA: Diagnosis not present

## 2017-10-04 DIAGNOSIS — M6281 Muscle weakness (generalized): Secondary | ICD-10-CM | POA: Diagnosis not present

## 2017-10-04 DIAGNOSIS — M6250 Muscle wasting and atrophy, not elsewhere classified, unspecified site: Secondary | ICD-10-CM | POA: Diagnosis not present

## 2017-10-05 DIAGNOSIS — R531 Weakness: Secondary | ICD-10-CM | POA: Diagnosis not present

## 2017-10-05 DIAGNOSIS — R262 Difficulty in walking, not elsewhere classified: Secondary | ICD-10-CM | POA: Diagnosis not present

## 2017-10-05 DIAGNOSIS — M6250 Muscle wasting and atrophy, not elsewhere classified, unspecified site: Secondary | ICD-10-CM | POA: Diagnosis not present

## 2017-10-05 DIAGNOSIS — M6281 Muscle weakness (generalized): Secondary | ICD-10-CM | POA: Diagnosis not present

## 2017-10-06 DIAGNOSIS — H401131 Primary open-angle glaucoma, bilateral, mild stage: Secondary | ICD-10-CM | POA: Diagnosis not present

## 2017-10-16 ENCOUNTER — Ambulatory Visit: Payer: Medicare Other | Admitting: Podiatry

## 2017-10-17 DIAGNOSIS — M6281 Muscle weakness (generalized): Secondary | ICD-10-CM | POA: Diagnosis not present

## 2017-10-17 DIAGNOSIS — R262 Difficulty in walking, not elsewhere classified: Secondary | ICD-10-CM | POA: Diagnosis not present

## 2017-10-17 DIAGNOSIS — R531 Weakness: Secondary | ICD-10-CM | POA: Diagnosis not present

## 2017-10-17 DIAGNOSIS — M6250 Muscle wasting and atrophy, not elsewhere classified, unspecified site: Secondary | ICD-10-CM | POA: Diagnosis not present

## 2017-10-19 DIAGNOSIS — R262 Difficulty in walking, not elsewhere classified: Secondary | ICD-10-CM | POA: Diagnosis not present

## 2017-10-19 DIAGNOSIS — M6281 Muscle weakness (generalized): Secondary | ICD-10-CM | POA: Diagnosis not present

## 2017-10-19 DIAGNOSIS — R531 Weakness: Secondary | ICD-10-CM | POA: Diagnosis not present

## 2017-10-19 DIAGNOSIS — M6250 Muscle wasting and atrophy, not elsewhere classified, unspecified site: Secondary | ICD-10-CM | POA: Diagnosis not present

## 2017-10-20 DIAGNOSIS — M6281 Muscle weakness (generalized): Secondary | ICD-10-CM | POA: Diagnosis not present

## 2017-10-20 DIAGNOSIS — M6250 Muscle wasting and atrophy, not elsewhere classified, unspecified site: Secondary | ICD-10-CM | POA: Diagnosis not present

## 2017-10-20 DIAGNOSIS — R262 Difficulty in walking, not elsewhere classified: Secondary | ICD-10-CM | POA: Diagnosis not present

## 2017-10-20 DIAGNOSIS — R531 Weakness: Secondary | ICD-10-CM | POA: Diagnosis not present

## 2017-10-22 DIAGNOSIS — M6250 Muscle wasting and atrophy, not elsewhere classified, unspecified site: Secondary | ICD-10-CM | POA: Diagnosis not present

## 2017-10-22 DIAGNOSIS — R531 Weakness: Secondary | ICD-10-CM | POA: Diagnosis not present

## 2017-10-22 DIAGNOSIS — M6281 Muscle weakness (generalized): Secondary | ICD-10-CM | POA: Diagnosis not present

## 2017-10-22 DIAGNOSIS — R262 Difficulty in walking, not elsewhere classified: Secondary | ICD-10-CM | POA: Diagnosis not present

## 2017-10-26 DIAGNOSIS — R262 Difficulty in walking, not elsewhere classified: Secondary | ICD-10-CM | POA: Diagnosis not present

## 2017-10-26 DIAGNOSIS — R531 Weakness: Secondary | ICD-10-CM | POA: Diagnosis not present

## 2017-10-26 DIAGNOSIS — M6250 Muscle wasting and atrophy, not elsewhere classified, unspecified site: Secondary | ICD-10-CM | POA: Diagnosis not present

## 2017-10-26 DIAGNOSIS — M6281 Muscle weakness (generalized): Secondary | ICD-10-CM | POA: Diagnosis not present

## 2017-10-27 DIAGNOSIS — M6281 Muscle weakness (generalized): Secondary | ICD-10-CM | POA: Diagnosis not present

## 2017-10-27 DIAGNOSIS — R262 Difficulty in walking, not elsewhere classified: Secondary | ICD-10-CM | POA: Diagnosis not present

## 2017-10-27 DIAGNOSIS — R531 Weakness: Secondary | ICD-10-CM | POA: Diagnosis not present

## 2017-10-27 DIAGNOSIS — M6250 Muscle wasting and atrophy, not elsewhere classified, unspecified site: Secondary | ICD-10-CM | POA: Diagnosis not present

## 2017-11-02 DIAGNOSIS — R262 Difficulty in walking, not elsewhere classified: Secondary | ICD-10-CM | POA: Diagnosis not present

## 2017-11-02 DIAGNOSIS — R531 Weakness: Secondary | ICD-10-CM | POA: Diagnosis not present

## 2017-11-02 DIAGNOSIS — M6281 Muscle weakness (generalized): Secondary | ICD-10-CM | POA: Diagnosis not present

## 2017-11-02 DIAGNOSIS — M6250 Muscle wasting and atrophy, not elsewhere classified, unspecified site: Secondary | ICD-10-CM | POA: Diagnosis not present

## 2017-11-03 DIAGNOSIS — R531 Weakness: Secondary | ICD-10-CM | POA: Diagnosis not present

## 2017-11-03 DIAGNOSIS — R262 Difficulty in walking, not elsewhere classified: Secondary | ICD-10-CM | POA: Diagnosis not present

## 2017-11-03 DIAGNOSIS — M6281 Muscle weakness (generalized): Secondary | ICD-10-CM | POA: Diagnosis not present

## 2017-11-03 DIAGNOSIS — M6250 Muscle wasting and atrophy, not elsewhere classified, unspecified site: Secondary | ICD-10-CM | POA: Diagnosis not present

## 2017-11-07 DIAGNOSIS — M6250 Muscle wasting and atrophy, not elsewhere classified, unspecified site: Secondary | ICD-10-CM | POA: Diagnosis not present

## 2017-11-07 DIAGNOSIS — M6281 Muscle weakness (generalized): Secondary | ICD-10-CM | POA: Diagnosis not present

## 2017-11-07 DIAGNOSIS — R531 Weakness: Secondary | ICD-10-CM | POA: Diagnosis not present

## 2017-11-07 DIAGNOSIS — R262 Difficulty in walking, not elsewhere classified: Secondary | ICD-10-CM | POA: Diagnosis not present

## 2017-11-09 DIAGNOSIS — R262 Difficulty in walking, not elsewhere classified: Secondary | ICD-10-CM | POA: Diagnosis not present

## 2017-11-09 DIAGNOSIS — M6281 Muscle weakness (generalized): Secondary | ICD-10-CM | POA: Diagnosis not present

## 2017-11-09 DIAGNOSIS — M6250 Muscle wasting and atrophy, not elsewhere classified, unspecified site: Secondary | ICD-10-CM | POA: Diagnosis not present

## 2017-11-09 DIAGNOSIS — R531 Weakness: Secondary | ICD-10-CM | POA: Diagnosis not present

## 2017-11-10 DIAGNOSIS — R262 Difficulty in walking, not elsewhere classified: Secondary | ICD-10-CM | POA: Diagnosis not present

## 2017-11-10 DIAGNOSIS — M6250 Muscle wasting and atrophy, not elsewhere classified, unspecified site: Secondary | ICD-10-CM | POA: Diagnosis not present

## 2017-11-10 DIAGNOSIS — M6281 Muscle weakness (generalized): Secondary | ICD-10-CM | POA: Diagnosis not present

## 2017-11-10 DIAGNOSIS — R531 Weakness: Secondary | ICD-10-CM | POA: Diagnosis not present

## 2017-11-15 DIAGNOSIS — R262 Difficulty in walking, not elsewhere classified: Secondary | ICD-10-CM | POA: Diagnosis not present

## 2017-11-15 DIAGNOSIS — M6250 Muscle wasting and atrophy, not elsewhere classified, unspecified site: Secondary | ICD-10-CM | POA: Diagnosis not present

## 2017-11-15 DIAGNOSIS — R531 Weakness: Secondary | ICD-10-CM | POA: Diagnosis not present

## 2017-11-15 DIAGNOSIS — M6281 Muscle weakness (generalized): Secondary | ICD-10-CM | POA: Diagnosis not present

## 2017-11-16 DIAGNOSIS — M6250 Muscle wasting and atrophy, not elsewhere classified, unspecified site: Secondary | ICD-10-CM | POA: Diagnosis not present

## 2017-11-16 DIAGNOSIS — M6281 Muscle weakness (generalized): Secondary | ICD-10-CM | POA: Diagnosis not present

## 2017-11-16 DIAGNOSIS — R262 Difficulty in walking, not elsewhere classified: Secondary | ICD-10-CM | POA: Diagnosis not present

## 2017-11-16 DIAGNOSIS — R531 Weakness: Secondary | ICD-10-CM | POA: Diagnosis not present

## 2017-11-20 ENCOUNTER — Ambulatory Visit: Payer: Medicare Other | Admitting: Podiatry

## 2017-11-20 ENCOUNTER — Ambulatory Visit (INDEPENDENT_AMBULATORY_CARE_PROVIDER_SITE_OTHER): Payer: Medicare Other | Admitting: Podiatry

## 2017-11-20 ENCOUNTER — Encounter: Payer: Self-pay | Admitting: Podiatry

## 2017-11-20 DIAGNOSIS — B351 Tinea unguium: Secondary | ICD-10-CM | POA: Diagnosis not present

## 2017-11-20 DIAGNOSIS — E119 Type 2 diabetes mellitus without complications: Secondary | ICD-10-CM

## 2017-11-20 DIAGNOSIS — M79674 Pain in right toe(s): Secondary | ICD-10-CM | POA: Diagnosis not present

## 2017-11-20 DIAGNOSIS — M79675 Pain in left toe(s): Secondary | ICD-10-CM

## 2017-11-20 NOTE — Progress Notes (Signed)
Complaint:  Visit Type: Patient returns to my office for continued preventative foot care services. Complaint: Patient states" my nails have grown long and thick and become painful to walk and wear shoes" . The patient presents for preventative foot care services. No changes to ROS.  Patient is diagnosed with diabetes.  Podiatric Exam: Vascular: dorsalis pedis and posterior tibial pulses are palpable bilateral. Capillary return is immediate. Temperature gradient is WNL. Skin turgor WNL  Sensorium: Normal Semmes Weinstein monofilament test. Normal tactile sensation bilaterally. Nail Exam: Pt has thick disfigured discolored nails with subungual debris noted bilateral entire nail hallux through fifth toenails Ulcer Exam: There is no evidence of ulcer or pre-ulcerative changes or infection. Orthopedic Exam: Muscle tone and strength are WNL. No limitations in general ROM. No crepitus or effusions noted. Foot type and digits show no abnormalities. Bony prominences are unremarkable.Swelling  B/L. Skin: No Porokeratosis. No infection or ulcers  Diagnosis:  Onychomycosis, , Pain in right toe, pain in left toes  Treatment & Plan Procedures and Treatment: Consent by patient was obtained for treatment procedures.   Debridement of mycotic and hypertrophic toenails, 1 through 5 bilateral and clearing of subungual debris. No ulceration, no infection noted. ABN signed for 2019.  Return Visit-Office Procedure: Patient instructed to return to the office for a follow up visit 3 months for continued evaluation and treatment.    Assad Harbeson DPM 

## 2017-11-22 DIAGNOSIS — R262 Difficulty in walking, not elsewhere classified: Secondary | ICD-10-CM | POA: Diagnosis not present

## 2017-11-22 DIAGNOSIS — R531 Weakness: Secondary | ICD-10-CM | POA: Diagnosis not present

## 2017-11-22 DIAGNOSIS — M6250 Muscle wasting and atrophy, not elsewhere classified, unspecified site: Secondary | ICD-10-CM | POA: Diagnosis not present

## 2017-11-22 DIAGNOSIS — M6281 Muscle weakness (generalized): Secondary | ICD-10-CM | POA: Diagnosis not present

## 2017-11-24 DIAGNOSIS — M6250 Muscle wasting and atrophy, not elsewhere classified, unspecified site: Secondary | ICD-10-CM | POA: Diagnosis not present

## 2017-11-24 DIAGNOSIS — R531 Weakness: Secondary | ICD-10-CM | POA: Diagnosis not present

## 2017-11-24 DIAGNOSIS — R262 Difficulty in walking, not elsewhere classified: Secondary | ICD-10-CM | POA: Diagnosis not present

## 2017-11-24 DIAGNOSIS — M6281 Muscle weakness (generalized): Secondary | ICD-10-CM | POA: Diagnosis not present

## 2017-11-28 DIAGNOSIS — R262 Difficulty in walking, not elsewhere classified: Secondary | ICD-10-CM | POA: Diagnosis not present

## 2017-11-28 DIAGNOSIS — M6281 Muscle weakness (generalized): Secondary | ICD-10-CM | POA: Diagnosis not present

## 2017-11-28 DIAGNOSIS — R531 Weakness: Secondary | ICD-10-CM | POA: Diagnosis not present

## 2017-11-28 DIAGNOSIS — M6250 Muscle wasting and atrophy, not elsewhere classified, unspecified site: Secondary | ICD-10-CM | POA: Diagnosis not present

## 2017-11-29 DIAGNOSIS — M6281 Muscle weakness (generalized): Secondary | ICD-10-CM | POA: Diagnosis not present

## 2017-11-29 DIAGNOSIS — M6250 Muscle wasting and atrophy, not elsewhere classified, unspecified site: Secondary | ICD-10-CM | POA: Diagnosis not present

## 2017-11-29 DIAGNOSIS — R262 Difficulty in walking, not elsewhere classified: Secondary | ICD-10-CM | POA: Diagnosis not present

## 2017-11-29 DIAGNOSIS — R531 Weakness: Secondary | ICD-10-CM | POA: Diagnosis not present

## 2018-01-23 DIAGNOSIS — L570 Actinic keratosis: Secondary | ICD-10-CM | POA: Diagnosis not present

## 2018-01-23 DIAGNOSIS — Z85820 Personal history of malignant melanoma of skin: Secondary | ICD-10-CM | POA: Diagnosis not present

## 2018-01-23 DIAGNOSIS — L821 Other seborrheic keratosis: Secondary | ICD-10-CM | POA: Diagnosis not present

## 2018-01-23 DIAGNOSIS — L814 Other melanin hyperpigmentation: Secondary | ICD-10-CM | POA: Diagnosis not present

## 2018-01-29 DIAGNOSIS — H90A31 Mixed conductive and sensorineural hearing loss, unilateral, right ear with restricted hearing on the contralateral side: Secondary | ICD-10-CM | POA: Diagnosis not present

## 2018-01-29 DIAGNOSIS — H90A22 Sensorineural hearing loss, unilateral, left ear, with restricted hearing on the contralateral side: Secondary | ICD-10-CM | POA: Diagnosis not present

## 2018-02-13 ENCOUNTER — Encounter: Payer: Self-pay | Admitting: Podiatry

## 2018-02-13 ENCOUNTER — Ambulatory Visit (INDEPENDENT_AMBULATORY_CARE_PROVIDER_SITE_OTHER): Payer: Medicare Other | Admitting: Podiatry

## 2018-02-13 DIAGNOSIS — M79675 Pain in left toe(s): Secondary | ICD-10-CM | POA: Diagnosis not present

## 2018-02-13 DIAGNOSIS — M79674 Pain in right toe(s): Secondary | ICD-10-CM

## 2018-02-13 DIAGNOSIS — B351 Tinea unguium: Secondary | ICD-10-CM

## 2018-02-13 DIAGNOSIS — E119 Type 2 diabetes mellitus without complications: Secondary | ICD-10-CM

## 2018-02-13 NOTE — Progress Notes (Signed)
Complaint:  Visit Type: Patient returns to my office for continued preventative foot care services. Complaint: Patient states" my nails have grown long and thick and become painful to walk and wear shoes" . The patient presents for preventative foot care services. No changes to ROS.  Patient is diagnosed with diabetes.  Podiatric Exam: Vascular: dorsalis pedis and posterior tibial pulses are palpable bilateral. Capillary return is immediate. Temperature gradient is WNL. Skin turgor WNL  Sensorium: Normal Semmes Weinstein monofilament test. Normal tactile sensation bilaterally. Nail Exam: Pt has thick disfigured discolored nails with subungual debris noted bilateral entire nail hallux through fifth toenails Ulcer Exam: There is no evidence of ulcer or pre-ulcerative changes or infection. Orthopedic Exam: Muscle tone and strength are WNL. No limitations in general ROM. No crepitus or effusions noted. Foot type and digits show no abnormalities. Bony prominences are unremarkable.Swelling  B/L. Skin: No Porokeratosis. No infection or ulcers  Diagnosis:  Onychomycosis, , Pain in right toe, pain in left toes  Treatment & Plan Procedures and Treatment: Consent by patient was obtained for treatment procedures.   Debridement of mycotic and hypertrophic toenails, 1 through 5 bilateral and clearing of subungual debris. No ulceration, no infection noted. ABN signed for 2019.  Return Visit-Office Procedure: Patient instructed to return to the office for a follow up visit 3 months for continued evaluation and treatment.    Lihanna Biever DPM 

## 2018-02-14 DIAGNOSIS — Z1231 Encounter for screening mammogram for malignant neoplasm of breast: Secondary | ICD-10-CM | POA: Diagnosis not present

## 2018-02-14 DIAGNOSIS — Z853 Personal history of malignant neoplasm of breast: Secondary | ICD-10-CM | POA: Diagnosis not present

## 2018-03-06 DIAGNOSIS — N183 Chronic kidney disease, stage 3 (moderate): Secondary | ICD-10-CM | POA: Diagnosis not present

## 2018-03-06 DIAGNOSIS — D631 Anemia in chronic kidney disease: Secondary | ICD-10-CM | POA: Diagnosis not present

## 2018-03-06 DIAGNOSIS — E1122 Type 2 diabetes mellitus with diabetic chronic kidney disease: Secondary | ICD-10-CM | POA: Diagnosis not present

## 2018-03-06 DIAGNOSIS — M1000 Idiopathic gout, unspecified site: Secondary | ICD-10-CM | POA: Diagnosis not present

## 2018-03-06 DIAGNOSIS — E039 Hypothyroidism, unspecified: Secondary | ICD-10-CM | POA: Diagnosis not present

## 2018-03-06 DIAGNOSIS — M810 Age-related osteoporosis without current pathological fracture: Secondary | ICD-10-CM | POA: Diagnosis not present

## 2018-03-06 DIAGNOSIS — I10 Essential (primary) hypertension: Secondary | ICD-10-CM | POA: Diagnosis not present

## 2018-03-06 DIAGNOSIS — Z6841 Body Mass Index (BMI) 40.0 and over, adult: Secondary | ICD-10-CM | POA: Diagnosis not present

## 2018-03-07 DIAGNOSIS — M1000 Idiopathic gout, unspecified site: Secondary | ICD-10-CM | POA: Diagnosis not present

## 2018-03-07 DIAGNOSIS — E875 Hyperkalemia: Secondary | ICD-10-CM | POA: Diagnosis not present

## 2018-03-19 ENCOUNTER — Encounter (HOSPITAL_COMMUNITY): Payer: Self-pay

## 2018-03-19 ENCOUNTER — Emergency Department (HOSPITAL_COMMUNITY)
Admission: EM | Admit: 2018-03-19 | Discharge: 2018-03-19 | Disposition: A | Discharge status: Home / Self Care | Payer: Medicare Other | Attending: Emergency Medicine | Admitting: Emergency Medicine

## 2018-03-19 ENCOUNTER — Other Ambulatory Visit: Payer: Self-pay

## 2018-03-19 DIAGNOSIS — E039 Hypothyroidism, unspecified: Secondary | ICD-10-CM | POA: Diagnosis not present

## 2018-03-19 DIAGNOSIS — E1122 Type 2 diabetes mellitus with diabetic chronic kidney disease: Secondary | ICD-10-CM | POA: Diagnosis not present

## 2018-03-19 DIAGNOSIS — N189 Chronic kidney disease, unspecified: Secondary | ICD-10-CM | POA: Insufficient documentation

## 2018-03-19 DIAGNOSIS — Z96653 Presence of artificial knee joint, bilateral: Secondary | ICD-10-CM | POA: Diagnosis not present

## 2018-03-19 DIAGNOSIS — Z853 Personal history of malignant neoplasm of breast: Secondary | ICD-10-CM | POA: Insufficient documentation

## 2018-03-19 DIAGNOSIS — Z87891 Personal history of nicotine dependence: Secondary | ICD-10-CM | POA: Diagnosis not present

## 2018-03-19 DIAGNOSIS — Z85828 Personal history of other malignant neoplasm of skin: Secondary | ICD-10-CM | POA: Insufficient documentation

## 2018-03-19 DIAGNOSIS — T8522XA Displacement of intraocular lens, initial encounter: Secondary | ICD-10-CM | POA: Diagnosis not present

## 2018-03-19 DIAGNOSIS — Z79899 Other long term (current) drug therapy: Secondary | ICD-10-CM | POA: Insufficient documentation

## 2018-03-19 DIAGNOSIS — H538 Other visual disturbances: Secondary | ICD-10-CM | POA: Diagnosis not present

## 2018-03-19 DIAGNOSIS — I129 Hypertensive chronic kidney disease with stage 1 through stage 4 chronic kidney disease, or unspecified chronic kidney disease: Secondary | ICD-10-CM | POA: Insufficient documentation

## 2018-03-19 LAB — CBC WITH DIFFERENTIAL/PLATELET
Basophils Absolute: 0 10*3/uL (ref 0.0–0.1)
Basophils Relative: 0 %
EOS PCT: 3 %
Eosinophils Absolute: 0.2 10*3/uL (ref 0.0–0.7)
HCT: 32.8 % — ABNORMAL LOW (ref 36.0–46.0)
Hemoglobin: 10.3 g/dL — ABNORMAL LOW (ref 12.0–15.0)
LYMPHS ABS: 1.4 10*3/uL (ref 0.7–4.0)
LYMPHS PCT: 22 %
MCH: 30.1 pg (ref 26.0–34.0)
MCHC: 31.4 g/dL (ref 30.0–36.0)
MCV: 95.9 fL (ref 78.0–100.0)
MONO ABS: 0.5 10*3/uL (ref 0.1–1.0)
Monocytes Relative: 7 %
Neutro Abs: 4.2 10*3/uL (ref 1.7–7.7)
Neutrophils Relative %: 68 %
PLATELETS: 259 10*3/uL (ref 150–400)
RBC: 3.42 MIL/uL — ABNORMAL LOW (ref 3.87–5.11)
RDW: 14.7 % (ref 11.5–15.5)
WBC: 6.2 10*3/uL (ref 4.0–10.5)

## 2018-03-19 LAB — BASIC METABOLIC PANEL
Anion gap: 10 (ref 5–15)
BUN: 33 mg/dL — AB (ref 6–20)
CO2: 22 mmol/L (ref 22–32)
CREATININE: 1.5 mg/dL — AB (ref 0.44–1.00)
Calcium: 9.5 mg/dL (ref 8.9–10.3)
Chloride: 111 mmol/L (ref 101–111)
GFR calc Af Amer: 38 mL/min — ABNORMAL LOW (ref >60)
GFR calc non Af Amer: 32 mL/min — ABNORMAL LOW (ref >60)
GLUCOSE: 138 mg/dL — AB (ref 65–99)
POTASSIUM: 4.5 mmol/L (ref 3.5–5.1)
SODIUM: 143 mmol/L (ref 135–145)

## 2018-03-19 NOTE — ED Notes (Signed)
Pt right eye is 20/60, left eye is 20/30, both eyes are 20/40

## 2018-03-19 NOTE — Discharge Instructions (Addendum)
Please proceed directly to Dr. Colan Neptune office following discharge from the ED.

## 2018-03-19 NOTE — ED Provider Notes (Signed)
[facility] DEPT Provider Note   CSN: [id number] Arrival date & time: 03/19/18  1015     History   Chief Complaint Chief Complaint  Patient presents with  . Blurred Vision    HPI [facility] is a 77 y.o. female.  HPI  [facility] is a 77 y.o. female, with a history of DM, HTN, hyperlipidemia, presenting to the ED with blurry vision in the right eye beginning around 1:30 PM yesterday.  States, "At first I thought a hair fell in my eye, but then after I tried to brush my hair out of my eye, nothing changed."  Patient states she called her PCP, Dr. Sabra Heck, and was told to come to the ED because they would have a ophthalmology services available, if needed.  Denies eye pain, discharge, double vision, vision loss, extremity weakness, numbness, difficulty speaking or swallowing, shortness of breath, chest pain, headache, neck pain, or any other complaints.      Past Medical History:  Diagnosis Date  . Anemia   . Arthritis   . Cancer ([place])    hx of skin ca  . Chronic kidney disease    CRI- sees Dr Posey Pronto  . Diabetes mellitus    oral  . Gout   . H/O hiatal hernia   . Hearing loss   . Hyperlipidemia   . Hypertension   . Hypothyroidism   . Irritable bowel   . Osteoporosis 02/2013   T score -2.9  . PONV (postoperative nausea and vomiting)    yearsago  . Shortness of breath    "mild upon exertion"  . Urinary tract infection    hx of    Patient Active Problem List   Diagnosis Date Noted  . Idiopathic chronic gout, unspecified site, without tophus (tophi) 12/12/2016  . Dyspnea 06/26/2015  . Angina pectoris ([place]) 06/26/2015  . Former smoker 06/26/2015  . Obesity 06/26/2015  . Type 2 diabetes mellitus without complication ([place]) 25/42/7062  . OSA (obstructive sleep apnea) 06/26/2015  . Aortic stenosis 06/26/2015  . Breast cancer of upper-inner quadrant of left female breast ([place]) 09/05/2013  . Osteoporosis 08/22/2013  .  GERD (gastroesophageal reflux disease) 08/22/2013  . Hypothyroidism 08/22/2013  . Anxiety 08/22/2013  . Hypertension 08/22/2013  . Diabetes due to undrl condition w oth diabetic kidney comp ([place]) 08/22/2013  . Osteoarthritis of left knee 08/02/2013    Class: Diagnosis of    Past Surgical History:  Procedure Laterality Date  . ACHILLES TENDON SURGERY    . BREAST LUMPECTOMY     left, with 2 nodes removed  . DILATION AND CURETTAGE OF UTERUS    . EYE SURGERY     bilateral cataract surgery  . SKIN CANCER EXCISION     approx 4 years ago.  Nose  . TONSILLECTOMY    . TOTAL KNEE ARTHROPLASTY  01/04/2012   Procedure: TOTAL KNEE ARTHROPLASTY;  Surgeon: Newt Minion, MD;  Location: [facility];  Service: Orthopedics;  Laterality: Right;  Right Total Knee Arthroplasty  . TOTAL KNEE ARTHROPLASTY Left 07/31/2013   Procedure: LEFT TOTAL KNEE ARTHROPLASTY;  Surgeon: Newt Minion, MD;  Location: [facility];  Service: Orthopedics;  Laterality: Left;  Left Total Knee Arthroplasty     OB History   None      Home Medications    Prior to Admission medications   Medication Sig Start Date End Date Taking? Authorizing Provider  alendronate (FOSAMAX) 70 MG tablet Take 70 mg by mouth  every 7 (seven) days. Take with a full glass of water on an empty stomach.   Yes [provider]  allopurinol (ZYLOPRIM) 100 MG tablet Take 100 mg by mouth daily.  12/29/14  Yes [provider]  diphenhydramine-acetaminophen (TYLENOL PM) 25-500 MG TABS Take 1 tablet by mouth at bedtime as needed. For sleep   Yes [provider]  ferrous sulfate 325 (65 FE) MG tablet Take 325 mg by mouth daily with breakfast.   Yes [provider]  hydrochlorothiazide (HYDRODIURIL) 25 MG tablet Take 25 mg by mouth.    Yes [provider]  irbesartan (AVAPRO) 300 MG tablet Take 300 mg by mouth daily.    Yes [provider]  pioglitazone (ACTOS) 15 MG tablet Take 15 mg by mouth daily.    Yes [provider]  sitaGLIPtin (JANUVIA) 100 MG tablet Take 100 mg by mouth daily.   Yes [provider]  SYNTHROID 137 MCG tablet Take 137 mcg by mouth daily before breakfast.  06/09/15  Yes [provider]  timolol (TIMOPTIC) 0.5 % ophthalmic solution Place 1 drop into both eyes daily.  04/20/15  Yes [provider]    Family History Family History  Problem Relation Age of Onset  . Cancer Father        Prostate  . Cancer Sister        Pancreatic cancer  . Cancer Brother        Brain cancer  . Hypertension Brother   . Heart disease Mother     Social History Social History   Tobacco Use  . Smoking status: Former Smoker    Years: 40.00    Types: Cigarettes  . Smokeless tobacco: Never Used  . Tobacco comment: stopped smoking 2009  Substance Use Topics  . Alcohol use: No  . Drug use: No     Allergies   Darvon   Review of Systems Review of Systems  Constitutional: Negative for fever.  Eyes: Positive for visual disturbance.  Respiratory: Negative for shortness of breath.   Cardiovascular: Negative for chest pain.  Gastrointestinal: Negative for nausea and vomiting.  Musculoskeletal: Negative for neck pain and neck stiffness.  Neurological: Negative for dizziness, weakness, light-headedness, numbness and headaches.  All other systems reviewed and are negative.    Physical Exam Updated Vital Signs BP (!) 150/95 (BP Location: Right Arm)   Pulse 92   Temp 97.9 F (36.6 C) (Oral)   Resp 20   Ht 5\' 8"  (1.727 m)   Wt 117.9 kg (260 lb)   SpO2 98%   BMI 39.53 kg/m   Physical Exam  Constitutional: She is oriented to person, place, and time. She appears well-developed and well-nourished. No distress.  HENT:  Head: Normocephalic and atraumatic.  Eyes: Pupils are equal, round, and reactive to light. Conjunctivae and EOM are normal.  Appears to have no peripheral field loss.  No pain with EOMS. Globe appears to be intact. No eye redness or  discharge.   Pt right eye is 20/60, left eye is 20/30, both eyes are 20/40  Neck: Normal range of motion. Neck supple.  Cardiovascular: Normal rate, regular rhythm and intact distal pulses.  Pulmonary/Chest: Effort normal. No respiratory distress.  Abdominal: There is no guarding.  Musculoskeletal: She exhibits no edema.  Lymphadenopathy:    She has no cervical adenopathy.  Neurological: She is alert and oriented to person, place, and time.  No sensory deficits.  No noted speech deficits. No aphasia. Patient  handles oral secretions without difficulty. No noted swallowing defects.  Equal grip strength bilaterally. Strength 5/5 in the upper extremities. Strength 5/5 with flexion and extension of the hips, knees, and ankles bilaterally.  Negative Romberg. No gait disturbance.  Coordination intact including heel to shin and finger to nose.  Cranial nerves III-XII grossly intact.  No facial droop.   Skin: Skin is warm and dry. She is not diaphoretic.  Psychiatric: She has a normal mood and affect. Her behavior is normal.  Nursing note and vitals reviewed.    ED Treatments / Results  Labs (all labs ordered are listed, but only abnormal results are displayed) Labs Reviewed  BASIC METABOLIC PANEL - Abnormal; Notable for the following components:      Result Value   Glucose, Bld 138 (*)    BUN 33 (*)    Creatinine, Ser 1.50 (*)    GFR calc non Af Amer 32 (*)    GFR calc Af Amer 38 (*)    All other components within normal limits  CBC WITH DIFFERENTIAL/PLATELET - Abnormal; Notable for the following components:   RBC 3.42 (*)    Hemoglobin 10.3 (*)    HCT 32.8 (*)    All other components within normal limits    EKG None  Radiology No results found.  Procedures Procedures (including critical care time)  Medications Ordered in ED Medications - No data to display   Initial Impression / Assessment and Plan / ED Course  I have reviewed the triage vital signs and the  nursing notes.  Pertinent labs & imaging results that were available during my care of the patient were reviewed by me and considered in my medical decision making (see chart for details).  Clinical Course as of Mar 19 1624  Mon Mar 19, 2018  1236 Spoke with Dr. Alanda Slim, ophthalmologist. Requests patient come to his office after discharge from the ED. No further work up or imaging needed here in the ED.    [SJ]  4166 Consistent with previous values.  Creatinine(!): 1.50 [SJ]  1240 Consistent with previous values.  Hemoglobin(!): 10.3 [SJ]    Clinical Course User Index [SJ] Oseph Imburgia C, PA-C    Patient presents with onset of blurry vision in the right eye beginning yesterday afternoon.  VAN negative.  Low suspicion for stroke.  Consulted with ophthalmology, recommended patient be discharged from the ED to be seen in their office.    Findings and plan of care discussed with Dorie Rank, MD.    Final Clinical Impressions(s) / ED Diagnoses   Final diagnoses:  Blurred vision    ED Discharge Orders    None       Layla Maw 03/19/18 1625    Dorie Rank, MD 03/21/18 [phone]

## 2018-03-19 NOTE — ED Triage Notes (Addendum)
Patient reports that she noticed blurred vision since approx 1330. Patient states the blurred vision is not getting worse since yesterday. Patient also reports that she has had a change in her medications recently.

## 2018-03-20 DIAGNOSIS — Z01818 Encounter for other preprocedural examination: Secondary | ICD-10-CM | POA: Diagnosis not present

## 2018-03-20 DIAGNOSIS — T8522XA Displacement of intraocular lens, initial encounter: Secondary | ICD-10-CM | POA: Diagnosis not present

## 2018-03-21 DIAGNOSIS — T8522XA Displacement of intraocular lens, initial encounter: Secondary | ICD-10-CM | POA: Diagnosis not present

## 2018-03-21 DIAGNOSIS — H5789 Other specified disorders of eye and adnexa: Secondary | ICD-10-CM | POA: Diagnosis not present

## 2018-04-05 DIAGNOSIS — H353131 Nonexudative age-related macular degeneration, bilateral, early dry stage: Secondary | ICD-10-CM | POA: Diagnosis not present

## 2018-04-11 DIAGNOSIS — H401131 Primary open-angle glaucoma, bilateral, mild stage: Secondary | ICD-10-CM | POA: Diagnosis not present

## 2018-04-11 DIAGNOSIS — H1820 Unspecified corneal edema: Secondary | ICD-10-CM | POA: Diagnosis not present

## 2018-04-11 DIAGNOSIS — H52201 Unspecified astigmatism, right eye: Secondary | ICD-10-CM | POA: Diagnosis not present

## 2018-04-11 DIAGNOSIS — H31421 Serous choroidal detachment, right eye: Secondary | ICD-10-CM | POA: Diagnosis not present

## 2018-05-08 DIAGNOSIS — T8522XA Displacement of intraocular lens, initial encounter: Secondary | ICD-10-CM | POA: Diagnosis not present

## 2018-05-08 DIAGNOSIS — H5789 Other specified disorders of eye and adnexa: Secondary | ICD-10-CM | POA: Diagnosis not present

## 2018-05-15 ENCOUNTER — Ambulatory Visit: Payer: Medicare Other | Admitting: Podiatry

## 2018-05-22 DIAGNOSIS — H353131 Nonexudative age-related macular degeneration, bilateral, early dry stage: Secondary | ICD-10-CM | POA: Diagnosis not present

## 2018-05-30 DIAGNOSIS — H4312 Vitreous hemorrhage, left eye: Secondary | ICD-10-CM | POA: Diagnosis not present

## 2018-06-22 ENCOUNTER — Ambulatory Visit: Payer: Medicare Other | Admitting: Podiatry

## 2018-07-04 ENCOUNTER — Encounter: Payer: Self-pay | Admitting: Podiatry

## 2018-07-04 ENCOUNTER — Ambulatory Visit (INDEPENDENT_AMBULATORY_CARE_PROVIDER_SITE_OTHER): Payer: Medicare Other | Admitting: Podiatry

## 2018-07-04 DIAGNOSIS — M79674 Pain in right toe(s): Secondary | ICD-10-CM

## 2018-07-04 DIAGNOSIS — B351 Tinea unguium: Secondary | ICD-10-CM | POA: Diagnosis not present

## 2018-07-04 DIAGNOSIS — M79675 Pain in left toe(s): Secondary | ICD-10-CM

## 2018-07-04 DIAGNOSIS — E119 Type 2 diabetes mellitus without complications: Secondary | ICD-10-CM

## 2018-07-04 NOTE — Progress Notes (Signed)
Complaint:  Visit Type: Patient returns to my office for continued preventative foot care services. Complaint: Patient states" my nails have grown long and thick and become painful to walk and wear shoes" . The patient presents for preventative foot care services. No changes to ROS.  Patient is diagnosed with diabetes.  Podiatric Exam: Vascular: dorsalis pedis and posterior tibial pulses are palpable bilateral. Capillary return is immediate. Temperature gradient is WNL. Skin turgor WNL  Sensorium: Normal Semmes Weinstein monofilament test. Normal tactile sensation bilaterally. Nail Exam: Pt has thick disfigured discolored nails with subungual debris noted bilateral entire nail hallux through fifth toenails Ulcer Exam: There is no evidence of ulcer or pre-ulcerative changes or infection. Orthopedic Exam: Muscle tone and strength are WNL. No limitations in general ROM. No crepitus or effusions noted. Foot type and digits show no abnormalities. Bony prominences are unremarkable.Swelling  B/L. Skin: No Porokeratosis. No infection or ulcers  Diagnosis:  Onychomycosis, , Pain in right toe, pain in left toes  Treatment & Plan Procedures and Treatment: Consent by patient was obtained for treatment procedures.   Debridement of mycotic and hypertrophic toenails, 1 through 5 bilateral and clearing of subungual debris. No ulceration, no infection noted. ABN signed for 2019.  Return Visit-Office Procedure: Patient instructed to return to the office for a follow up visit 3 months for continued evaluation and treatment.    Kaori Jumper DPM 

## 2018-08-08 DIAGNOSIS — Z23 Encounter for immunization: Secondary | ICD-10-CM | POA: Diagnosis not present

## 2018-08-16 DIAGNOSIS — E1169 Type 2 diabetes mellitus with other specified complication: Secondary | ICD-10-CM | POA: Diagnosis not present

## 2018-08-16 DIAGNOSIS — G4733 Obstructive sleep apnea (adult) (pediatric): Secondary | ICD-10-CM | POA: Diagnosis not present

## 2018-08-30 DIAGNOSIS — E119 Type 2 diabetes mellitus without complications: Secondary | ICD-10-CM | POA: Diagnosis not present

## 2018-08-30 DIAGNOSIS — H401131 Primary open-angle glaucoma, bilateral, mild stage: Secondary | ICD-10-CM | POA: Diagnosis not present

## 2018-08-30 DIAGNOSIS — H5213 Myopia, bilateral: Secondary | ICD-10-CM | POA: Diagnosis not present

## 2018-08-30 DIAGNOSIS — Z961 Presence of intraocular lens: Secondary | ICD-10-CM | POA: Diagnosis not present

## 2018-09-10 DIAGNOSIS — D1801 Hemangioma of skin and subcutaneous tissue: Secondary | ICD-10-CM | POA: Diagnosis not present

## 2018-09-10 DIAGNOSIS — L570 Actinic keratosis: Secondary | ICD-10-CM | POA: Diagnosis not present

## 2018-09-10 DIAGNOSIS — Z85828 Personal history of other malignant neoplasm of skin: Secondary | ICD-10-CM | POA: Diagnosis not present

## 2018-09-10 DIAGNOSIS — L821 Other seborrheic keratosis: Secondary | ICD-10-CM | POA: Diagnosis not present

## 2018-09-10 DIAGNOSIS — D225 Melanocytic nevi of trunk: Secondary | ICD-10-CM | POA: Diagnosis not present

## 2018-09-17 DIAGNOSIS — N183 Chronic kidney disease, stage 3 (moderate): Secondary | ICD-10-CM | POA: Diagnosis not present

## 2018-09-17 DIAGNOSIS — E1122 Type 2 diabetes mellitus with diabetic chronic kidney disease: Secondary | ICD-10-CM | POA: Diagnosis not present

## 2018-09-17 DIAGNOSIS — Z0000 Encounter for general adult medical examination without abnormal findings: Secondary | ICD-10-CM | POA: Diagnosis not present

## 2018-09-17 DIAGNOSIS — Z1389 Encounter for screening for other disorder: Secondary | ICD-10-CM | POA: Diagnosis not present

## 2018-09-17 DIAGNOSIS — E039 Hypothyroidism, unspecified: Secondary | ICD-10-CM | POA: Diagnosis not present

## 2018-09-17 DIAGNOSIS — M1000 Idiopathic gout, unspecified site: Secondary | ICD-10-CM | POA: Diagnosis not present

## 2018-09-17 DIAGNOSIS — I10 Essential (primary) hypertension: Secondary | ICD-10-CM | POA: Diagnosis not present

## 2018-09-17 DIAGNOSIS — M810 Age-related osteoporosis without current pathological fracture: Secondary | ICD-10-CM | POA: Diagnosis not present

## 2018-10-03 ENCOUNTER — Encounter: Payer: Self-pay | Admitting: Podiatry

## 2018-10-03 ENCOUNTER — Ambulatory Visit (INDEPENDENT_AMBULATORY_CARE_PROVIDER_SITE_OTHER): Payer: Medicare Other | Admitting: Podiatry

## 2018-10-03 DIAGNOSIS — M79675 Pain in left toe(s): Secondary | ICD-10-CM | POA: Diagnosis not present

## 2018-10-03 DIAGNOSIS — B351 Tinea unguium: Secondary | ICD-10-CM

## 2018-10-03 DIAGNOSIS — E119 Type 2 diabetes mellitus without complications: Secondary | ICD-10-CM

## 2018-10-03 DIAGNOSIS — M79674 Pain in right toe(s): Secondary | ICD-10-CM

## 2018-10-03 NOTE — Progress Notes (Signed)
Complaint:  Visit Type: Patient returns to my office for continued preventative foot care services. Complaint: Patient states" my nails have grown long and thick and become painful to walk and wear shoes" . The patient presents for preventative foot care services. No changes to ROS.  Patient is diagnosed with diabetes.  Podiatric Exam: Vascular: dorsalis pedis and posterior tibial pulses are palpable bilateral. Capillary return is immediate. Temperature gradient is WNL. Skin turgor WNL  Sensorium: Normal Semmes Weinstein monofilament test. Normal tactile sensation bilaterally. Nail Exam: Pt has thick disfigured discolored nails with subungual debris noted bilateral entire nail hallux through fifth toenails Ulcer Exam: There is no evidence of ulcer or pre-ulcerative changes or infection. Orthopedic Exam: Muscle tone and strength are WNL. No limitations in general ROM. No crepitus or effusions noted. Foot type and digits show no abnormalities. Bony prominences are unremarkable.Swelling  B/L. Skin: No Porokeratosis. No infection or ulcers  Diagnosis:  Onychomycosis, , Pain in right toe, pain in left toes  Treatment & Plan Procedures and Treatment: Consent by patient was obtained for treatment procedures.   Debridement of mycotic and hypertrophic toenails, 1 through 5 bilateral and clearing of subungual debris. No ulceration, no infection noted. ABN signed for 2019.  Return Visit-Office Procedure: Patient instructed to return to the office for a follow up visit 3 months for continued evaluation and treatment.    Gardiner Barefoot DPM

## 2019-01-02 ENCOUNTER — Encounter: Payer: Self-pay | Admitting: Podiatry

## 2019-01-02 ENCOUNTER — Ambulatory Visit (INDEPENDENT_AMBULATORY_CARE_PROVIDER_SITE_OTHER): Payer: Medicare Other | Admitting: Podiatry

## 2019-01-02 DIAGNOSIS — E119 Type 2 diabetes mellitus without complications: Secondary | ICD-10-CM

## 2019-01-02 DIAGNOSIS — M79675 Pain in left toe(s): Secondary | ICD-10-CM

## 2019-01-02 DIAGNOSIS — M79674 Pain in right toe(s): Secondary | ICD-10-CM

## 2019-01-02 DIAGNOSIS — B351 Tinea unguium: Secondary | ICD-10-CM | POA: Diagnosis not present

## 2019-01-02 NOTE — Progress Notes (Signed)
Complaint:  Visit Type: Patient returns to my office for continued preventative foot care services. Complaint: Patient states" my nails have grown long and thick and become painful to walk and wear shoes" . The patient presents for preventative foot care services. No changes to ROS.  Patient is diagnosed with diabetes.  Podiatric Exam: Vascular: dorsalis pedis and posterior tibial pulses are palpable bilateral. Capillary return is immediate. Temperature gradient is WNL. Skin turgor WNL  Sensorium: Normal Semmes Weinstein monofilament test. Normal tactile sensation bilaterally. Nail Exam: Pt has thick disfigured discolored nails with subungual debris noted bilateral entire nail hallux through fifth toenails Ulcer Exam: There is no evidence of ulcer or pre-ulcerative changes or infection. Orthopedic Exam: Muscle tone and strength are WNL. No limitations in general ROM. No crepitus or effusions noted. Foot type and digits show no abnormalities. Bony prominences are unremarkable.Swelling  B/L. Skin: No Porokeratosis. No infection or ulcers  Diagnosis:  Onychomycosis, , Pain in right toe, pain in left toes  Treatment & Plan Procedures and Treatment: Consent by patient was obtained for treatment procedures.   Debridement of mycotic and hypertrophic toenails, 1 through 5 bilateral and clearing of subungual debris. No ulceration, no infection noted. ABN signed for 2019.  Return Visit-Office Procedure: Patient instructed to return to the office for a follow up visit 3 months for continued evaluation and treatment.    Gardiner Barefoot DPM

## 2019-03-06 DIAGNOSIS — H401131 Primary open-angle glaucoma, bilateral, mild stage: Secondary | ICD-10-CM | POA: Diagnosis not present

## 2019-03-29 DIAGNOSIS — M810 Age-related osteoporosis without current pathological fracture: Secondary | ICD-10-CM | POA: Diagnosis not present

## 2019-03-29 DIAGNOSIS — M1000 Idiopathic gout, unspecified site: Secondary | ICD-10-CM | POA: Diagnosis not present

## 2019-03-29 DIAGNOSIS — N183 Chronic kidney disease, stage 3 (moderate): Secondary | ICD-10-CM | POA: Diagnosis not present

## 2019-03-29 DIAGNOSIS — E1122 Type 2 diabetes mellitus with diabetic chronic kidney disease: Secondary | ICD-10-CM | POA: Diagnosis not present

## 2019-03-29 DIAGNOSIS — G4733 Obstructive sleep apnea (adult) (pediatric): Secondary | ICD-10-CM | POA: Diagnosis not present

## 2019-03-29 DIAGNOSIS — Z7984 Long term (current) use of oral hypoglycemic drugs: Secondary | ICD-10-CM | POA: Diagnosis not present

## 2019-03-29 DIAGNOSIS — E039 Hypothyroidism, unspecified: Secondary | ICD-10-CM | POA: Diagnosis not present

## 2019-04-03 ENCOUNTER — Other Ambulatory Visit: Payer: Self-pay

## 2019-04-03 ENCOUNTER — Ambulatory Visit (INDEPENDENT_AMBULATORY_CARE_PROVIDER_SITE_OTHER): Payer: Medicare Other | Admitting: Podiatry

## 2019-04-03 ENCOUNTER — Encounter: Payer: Self-pay | Admitting: Podiatry

## 2019-04-03 VITALS — Temp 98.2°F

## 2019-04-03 DIAGNOSIS — M79675 Pain in left toe(s): Secondary | ICD-10-CM

## 2019-04-03 DIAGNOSIS — M79674 Pain in right toe(s): Secondary | ICD-10-CM | POA: Diagnosis not present

## 2019-04-03 DIAGNOSIS — B351 Tinea unguium: Secondary | ICD-10-CM

## 2019-04-03 DIAGNOSIS — E119 Type 2 diabetes mellitus without complications: Secondary | ICD-10-CM

## 2019-04-03 DIAGNOSIS — E0829 Diabetes mellitus due to underlying condition with other diabetic kidney complication: Secondary | ICD-10-CM

## 2019-04-03 NOTE — Progress Notes (Signed)
Complaint:  Visit Type: Patient returns to my office for continued preventative foot care services. Complaint: Patient states" my nails have grown long and thick and become painful to walk and wear shoes" . The patient presents for preventative foot care services. No changes to ROS.  Patient is diagnosed with diabetes.  Podiatric Exam: Vascular: dorsalis pedis and posterior tibial pulses are palpable bilateral. Capillary return is immediate. Temperature gradient is WNL. Skin turgor WNL  Sensorium: Normal Semmes Weinstein monofilament test. Normal tactile sensation bilaterally. Nail Exam: Pt has thick disfigured discolored nails with subungual debris noted bilateral entire nail hallux through fifth toenails.  Red inflamed medial border second toe left.   Ulcer Exam: There is no evidence of ulcer or pre-ulcerative changes or infection. Orthopedic Exam: Muscle tone and strength are WNL. No limitations in general ROM. No crepitus or effusions noted. Foot type and digits show no abnormalities. Bony prominences are unremarkable.Swelling  B/L. Skin: No Porokeratosis. No infection or ulcers  Diagnosis:  Onychomycosis, , Pain in right toe, pain in left toes  Treatment & Plan Procedures and Treatment: Consent by patient was obtained for treatment procedures.   Debridement of mycotic and hypertrophic toenails, 1 through 5 bilateral and clearing of subungual debris. No ulceration, no infection noted.  Return Visit-Office Procedure: Patient instructed to return to the office for a follow up visit 3 months for continued evaluation and treatment.    Gardiner Barefoot DPM

## 2019-05-31 DIAGNOSIS — D485 Neoplasm of uncertain behavior of skin: Secondary | ICD-10-CM | POA: Diagnosis not present

## 2019-05-31 DIAGNOSIS — C44319 Basal cell carcinoma of skin of other parts of face: Secondary | ICD-10-CM | POA: Diagnosis not present

## 2019-05-31 DIAGNOSIS — L821 Other seborrheic keratosis: Secondary | ICD-10-CM | POA: Diagnosis not present

## 2019-05-31 DIAGNOSIS — L570 Actinic keratosis: Secondary | ICD-10-CM | POA: Diagnosis not present

## 2019-05-31 DIAGNOSIS — C44629 Squamous cell carcinoma of skin of left upper limb, including shoulder: Secondary | ICD-10-CM | POA: Diagnosis not present

## 2019-05-31 DIAGNOSIS — L304 Erythema intertrigo: Secondary | ICD-10-CM | POA: Diagnosis not present

## 2019-06-09 ENCOUNTER — Emergency Department (HOSPITAL_COMMUNITY): Payer: Medicare Other

## 2019-06-09 ENCOUNTER — Other Ambulatory Visit: Payer: Self-pay

## 2019-06-09 ENCOUNTER — Inpatient Hospital Stay (HOSPITAL_COMMUNITY)
Admission: EM | Admit: 2019-06-09 | Discharge: 2019-06-14 | DRG: 516 | Disposition: A | Discharge status: Skilled Nursing Facility | Payer: Medicare Other | Attending: Specialist | Admitting: Specialist

## 2019-06-09 ENCOUNTER — Encounter (HOSPITAL_COMMUNITY): Payer: Self-pay | Admitting: *Deleted

## 2019-06-09 DIAGNOSIS — Z85828 Personal history of other malignant neoplasm of skin: Secondary | ICD-10-CM

## 2019-06-09 DIAGNOSIS — H9190 Unspecified hearing loss, unspecified ear: Secondary | ICD-10-CM | POA: Diagnosis present

## 2019-06-09 DIAGNOSIS — N189 Chronic kidney disease, unspecified: Secondary | ICD-10-CM | POA: Diagnosis not present

## 2019-06-09 DIAGNOSIS — Z419 Encounter for procedure for purposes other than remedying health state, unspecified: Secondary | ICD-10-CM

## 2019-06-09 DIAGNOSIS — Z808 Family history of malignant neoplasm of other organs or systems: Secondary | ICD-10-CM

## 2019-06-09 DIAGNOSIS — Y92008 Other place in unspecified non-institutional (private) residence as the place of occurrence of the external cause: Secondary | ICD-10-CM | POA: Diagnosis not present

## 2019-06-09 DIAGNOSIS — Z7984 Long term (current) use of oral hypoglycemic drugs: Secondary | ICD-10-CM | POA: Diagnosis not present

## 2019-06-09 DIAGNOSIS — S82031A Displaced transverse fracture of right patella, initial encounter for closed fracture: Principal | ICD-10-CM

## 2019-06-09 DIAGNOSIS — E1122 Type 2 diabetes mellitus with diabetic chronic kidney disease: Secondary | ICD-10-CM | POA: Diagnosis present

## 2019-06-09 DIAGNOSIS — R52 Pain, unspecified: Secondary | ICD-10-CM | POA: Diagnosis not present

## 2019-06-09 DIAGNOSIS — I5032 Chronic diastolic (congestive) heart failure: Secondary | ICD-10-CM | POA: Diagnosis present

## 2019-06-09 DIAGNOSIS — Z79899 Other long term (current) drug therapy: Secondary | ICD-10-CM

## 2019-06-09 DIAGNOSIS — W19XXXA Unspecified fall, initial encounter: Secondary | ICD-10-CM

## 2019-06-09 DIAGNOSIS — Z79890 Hormone replacement therapy (postmenopausal): Secondary | ICD-10-CM | POA: Diagnosis not present

## 2019-06-09 DIAGNOSIS — S82034A Nondisplaced transverse fracture of right patella, initial encounter for closed fracture: Secondary | ICD-10-CM | POA: Diagnosis not present

## 2019-06-09 DIAGNOSIS — E785 Hyperlipidemia, unspecified: Secondary | ICD-10-CM | POA: Diagnosis present

## 2019-06-09 DIAGNOSIS — M810 Age-related osteoporosis without current pathological fracture: Secondary | ICD-10-CM | POA: Diagnosis present

## 2019-06-09 DIAGNOSIS — R011 Cardiac murmur, unspecified: Secondary | ICD-10-CM | POA: Diagnosis present

## 2019-06-09 DIAGNOSIS — Z20828 Contact with and (suspected) exposure to other viral communicable diseases: Secondary | ICD-10-CM | POA: Diagnosis present

## 2019-06-09 DIAGNOSIS — M9711XA Periprosthetic fracture around internal prosthetic right knee joint, initial encounter: Secondary | ICD-10-CM | POA: Diagnosis present

## 2019-06-09 DIAGNOSIS — Z6841 Body Mass Index (BMI) 40.0 and over, adult: Secondary | ICD-10-CM

## 2019-06-09 DIAGNOSIS — N183 Chronic kidney disease, stage 3 (moderate): Secondary | ICD-10-CM | POA: Diagnosis present

## 2019-06-09 DIAGNOSIS — Z7983 Long term (current) use of bisphosphonates: Secondary | ICD-10-CM

## 2019-06-09 DIAGNOSIS — S82001A Unspecified fracture of right patella, initial encounter for closed fracture: Secondary | ICD-10-CM | POA: Diagnosis not present

## 2019-06-09 DIAGNOSIS — D649 Anemia, unspecified: Secondary | ICD-10-CM | POA: Diagnosis not present

## 2019-06-09 DIAGNOSIS — M2550 Pain in unspecified joint: Secondary | ICD-10-CM | POA: Diagnosis not present

## 2019-06-09 DIAGNOSIS — R609 Edema, unspecified: Secondary | ICD-10-CM | POA: Diagnosis not present

## 2019-06-09 DIAGNOSIS — I10 Essential (primary) hypertension: Secondary | ICD-10-CM | POA: Diagnosis not present

## 2019-06-09 DIAGNOSIS — W1830XA Fall on same level, unspecified, initial encounter: Secondary | ICD-10-CM | POA: Diagnosis present

## 2019-06-09 DIAGNOSIS — R0902 Hypoxemia: Secondary | ICD-10-CM | POA: Diagnosis not present

## 2019-06-09 DIAGNOSIS — M109 Gout, unspecified: Secondary | ICD-10-CM | POA: Diagnosis present

## 2019-06-09 DIAGNOSIS — Z87891 Personal history of nicotine dependence: Secondary | ICD-10-CM

## 2019-06-09 DIAGNOSIS — R0609 Other forms of dyspnea: Secondary | ICD-10-CM | POA: Diagnosis not present

## 2019-06-09 DIAGNOSIS — T84032A Mechanical loosening of internal right knee prosthetic joint, initial encounter: Secondary | ICD-10-CM | POA: Diagnosis present

## 2019-06-09 DIAGNOSIS — K5900 Constipation, unspecified: Secondary | ICD-10-CM | POA: Diagnosis not present

## 2019-06-09 DIAGNOSIS — G8918 Other acute postprocedural pain: Secondary | ICD-10-CM | POA: Diagnosis not present

## 2019-06-09 DIAGNOSIS — R0600 Dyspnea, unspecified: Secondary | ICD-10-CM

## 2019-06-09 DIAGNOSIS — D631 Anemia in chronic kidney disease: Secondary | ICD-10-CM | POA: Diagnosis present

## 2019-06-09 DIAGNOSIS — Z800 Family history of malignant neoplasm of digestive organs: Secondary | ICD-10-CM

## 2019-06-09 DIAGNOSIS — I130 Hypertensive heart and chronic kidney disease with heart failure and stage 1 through stage 4 chronic kidney disease, or unspecified chronic kidney disease: Secondary | ICD-10-CM | POA: Diagnosis present

## 2019-06-09 DIAGNOSIS — R21 Rash and other nonspecific skin eruption: Secondary | ICD-10-CM | POA: Diagnosis not present

## 2019-06-09 DIAGNOSIS — S82001D Unspecified fracture of right patella, subsequent encounter for closed fracture with routine healing: Secondary | ICD-10-CM | POA: Diagnosis not present

## 2019-06-09 DIAGNOSIS — R262 Difficulty in walking, not elsewhere classified: Secondary | ICD-10-CM | POA: Diagnosis not present

## 2019-06-09 DIAGNOSIS — S82041A Displaced comminuted fracture of right patella, initial encounter for closed fracture: Secondary | ICD-10-CM | POA: Diagnosis not present

## 2019-06-09 DIAGNOSIS — M150 Primary generalized (osteo)arthritis: Secondary | ICD-10-CM | POA: Diagnosis not present

## 2019-06-09 DIAGNOSIS — E039 Hypothyroidism, unspecified: Secondary | ICD-10-CM | POA: Diagnosis present

## 2019-06-09 DIAGNOSIS — K219 Gastro-esophageal reflux disease without esophagitis: Secondary | ICD-10-CM | POA: Diagnosis present

## 2019-06-09 DIAGNOSIS — T84033A Mechanical loosening of internal left knee prosthetic joint, initial encounter: Secondary | ICD-10-CM | POA: Diagnosis present

## 2019-06-09 DIAGNOSIS — Z888 Allergy status to other drugs, medicaments and biological substances status: Secondary | ICD-10-CM | POA: Diagnosis not present

## 2019-06-09 DIAGNOSIS — Z111 Encounter for screening for respiratory tuberculosis: Secondary | ICD-10-CM | POA: Diagnosis not present

## 2019-06-09 DIAGNOSIS — F419 Anxiety disorder, unspecified: Secondary | ICD-10-CM | POA: Diagnosis not present

## 2019-06-09 DIAGNOSIS — Z03818 Encounter for observation for suspected exposure to other biological agents ruled out: Secondary | ICD-10-CM | POA: Diagnosis not present

## 2019-06-09 DIAGNOSIS — M1990 Unspecified osteoarthritis, unspecified site: Secondary | ICD-10-CM | POA: Diagnosis not present

## 2019-06-09 DIAGNOSIS — Z7401 Bed confinement status: Secondary | ICD-10-CM | POA: Diagnosis not present

## 2019-06-09 DIAGNOSIS — S82034D Nondisplaced transverse fracture of right patella, subsequent encounter for closed fracture with routine healing: Secondary | ICD-10-CM | POA: Diagnosis not present

## 2019-06-09 DIAGNOSIS — E559 Vitamin D deficiency, unspecified: Secondary | ICD-10-CM | POA: Diagnosis present

## 2019-06-09 DIAGNOSIS — Z96653 Presence of artificial knee joint, bilateral: Secondary | ICD-10-CM | POA: Diagnosis present

## 2019-06-09 DIAGNOSIS — G4730 Sleep apnea, unspecified: Secondary | ICD-10-CM | POA: Diagnosis present

## 2019-06-09 DIAGNOSIS — Z8249 Family history of ischemic heart disease and other diseases of the circulatory system: Secondary | ICD-10-CM | POA: Diagnosis not present

## 2019-06-09 DIAGNOSIS — E119 Type 2 diabetes mellitus without complications: Secondary | ICD-10-CM | POA: Diagnosis not present

## 2019-06-09 DIAGNOSIS — M6281 Muscle weakness (generalized): Secondary | ICD-10-CM | POA: Diagnosis not present

## 2019-06-09 LAB — COMPREHENSIVE METABOLIC PANEL
ALT: 18 U/L (ref 0–44)
AST: 18 U/L (ref 15–41)
Albumin: 3.7 g/dL (ref 3.5–5.0)
Alkaline Phosphatase: 74 U/L (ref 38–126)
Anion gap: 10 (ref 5–15)
BUN: 29 mg/dL — ABNORMAL HIGH (ref 8–23)
CO2: 19 mmol/L — ABNORMAL LOW (ref 22–32)
Calcium: 9.6 mg/dL (ref 8.9–10.3)
Chloride: 108 mmol/L (ref 98–111)
Creatinine, Ser: 1.25 mg/dL — ABNORMAL HIGH (ref 0.44–1.00)
GFR calc Af Amer: 48 mL/min — ABNORMAL LOW (ref >60)
GFR calc non Af Amer: 41 mL/min — ABNORMAL LOW (ref >60)
Glucose, Bld: 211 mg/dL — ABNORMAL HIGH (ref 70–99)
Potassium: 4.7 mmol/L (ref 3.5–5.1)
Sodium: 137 mmol/L (ref 135–145)
Total Bilirubin: 0.6 mg/dL (ref 0.3–1.2)
Total Protein: 7.6 g/dL (ref 6.5–8.1)

## 2019-06-09 LAB — CBC
HCT: 35.3 % — ABNORMAL LOW (ref 36.0–46.0)
Hemoglobin: 11.2 g/dL — ABNORMAL LOW (ref 12.0–15.0)
MCH: 30.9 pg (ref 26.0–34.0)
MCHC: 31.7 g/dL (ref 30.0–36.0)
MCV: 97.5 fL (ref 80.0–100.0)
Platelets: 253 10*3/uL (ref 150–400)
RBC: 3.62 MIL/uL — ABNORMAL LOW (ref 3.87–5.11)
RDW: 14.7 % (ref 11.5–15.5)
WBC: 12 10*3/uL — ABNORMAL HIGH (ref 4.0–10.5)
nRBC: 0 % (ref 0.0–0.2)

## 2019-06-09 MED ORDER — ENOXAPARIN SODIUM 30 MG/0.3ML ~~LOC~~ SOLN
30.0000 mg | Freq: Every day | SUBCUTANEOUS | Status: DC
  Administered 2019-06-10: 30 mg via SUBCUTANEOUS
  Filled 2019-06-09: qty 0.3

## 2019-06-09 MED ORDER — ONDANSETRON HCL 4 MG/2ML IJ SOLN: 4.0000 mg | Freq: Four times a day (QID) | INTRAMUSCULAR | Status: DC | PRN

## 2019-06-09 MED ORDER — METHOCARBAMOL 1000 MG/10ML IJ SOLN
500.0000 mg | Freq: Four times a day (QID) | INTRAVENOUS | Status: DC | PRN
  Filled 2019-06-09: qty 5

## 2019-06-09 MED ORDER — MORPHINE SULFATE (PF) 2 MG/ML IV SOLN: 0.5000 mg | INTRAVENOUS | Status: DC | PRN

## 2019-06-09 MED ORDER — TIMOLOL MALEATE 0.5 % OP SOLN
1.0000 [drp] | Freq: Two times a day (BID) | OPHTHALMIC | Status: DC
  Administered 2019-06-10 – 2019-06-14 (×8): 1 [drp] via OPHTHALMIC
  Filled 2019-06-09: qty 5

## 2019-06-09 MED ORDER — FERROUS SULFATE 325 (65 FE) MG PO TABS
325.0000 mg | ORAL_TABLET | Freq: Every day | ORAL | Status: DC
  Administered 2019-06-10 – 2019-06-14 (×4): 325 mg via ORAL
  Filled 2019-06-09 (×4): qty 1

## 2019-06-09 MED ORDER — ALLOPURINOL 100 MG PO TABS
100.0000 mg | ORAL_TABLET | Freq: Every day | ORAL | Status: DC
  Administered 2019-06-10 – 2019-06-14 (×4): 100 mg via ORAL
  Filled 2019-06-09 (×6): qty 1

## 2019-06-09 MED ORDER — ONDANSETRON HCL 4 MG PO TABS: 4.0000 mg | ORAL_TABLET | Freq: Four times a day (QID) | ORAL | Status: DC | PRN

## 2019-06-09 MED ORDER — SODIUM CHLORIDE 0.9 % IV SOLN
INTRAVENOUS | Status: DC
  Administered 2019-06-10 – 2019-06-11 (×2): via INTRAVENOUS

## 2019-06-09 MED ORDER — POLYETHYLENE GLYCOL 3350 17 G PO PACK: 17.0000 g | PACK | Freq: Every day | ORAL | Status: DC | PRN

## 2019-06-09 MED ORDER — MORPHINE SULFATE (PF) 2 MG/ML IV SOLN
2.0000 mg | Freq: Once | INTRAVENOUS | Status: AC
  Administered 2019-06-09: 2 mg via INTRAVENOUS
  Filled 2019-06-09: qty 1

## 2019-06-09 MED ORDER — METOCLOPRAMIDE HCL 10 MG PO TABS: 5.0000 mg | ORAL_TABLET | Freq: Three times a day (TID) | ORAL | Status: DC | PRN

## 2019-06-09 MED ORDER — PROSIGHT PO TABS
1.0000 | ORAL_TABLET | Freq: Two times a day (BID) | ORAL | Status: DC
  Administered 2019-06-10 – 2019-06-14 (×8): 1 via ORAL
  Filled 2019-06-09 (×8): qty 1

## 2019-06-09 MED ORDER — HYDROCHLOROTHIAZIDE 25 MG PO TABS
25.0000 mg | ORAL_TABLET | Freq: Every day | ORAL | Status: DC
  Administered 2019-06-10: 25 mg via ORAL
  Filled 2019-06-09: qty 1

## 2019-06-09 MED ORDER — BISACODYL 10 MG RE SUPP
10.0000 mg | Freq: Every day | RECTAL | Status: DC | PRN
  Administered 2019-06-13: 09:00:00 10 mg via RECTAL
  Filled 2019-06-09: qty 1

## 2019-06-09 MED ORDER — HYDROCODONE-ACETAMINOPHEN 7.5-325 MG PO TABS: 1.0000 | ORAL_TABLET | ORAL | Status: DC | PRN

## 2019-06-09 MED ORDER — METOCLOPRAMIDE HCL 5 MG/ML IJ SOLN: 5.0000 mg | Freq: Three times a day (TID) | INTRAMUSCULAR | Status: DC | PRN

## 2019-06-09 MED ORDER — KETOCONAZOLE 2 % EX CREA: 1.0000 "application " | TOPICAL_CREAM | Freq: Two times a day (BID) | CUTANEOUS | Status: DC | PRN

## 2019-06-09 MED ORDER — IRBESARTAN 150 MG PO TABS
300.0000 mg | ORAL_TABLET | Freq: Every day | ORAL | Status: DC
  Administered 2019-06-10: 300 mg via ORAL
  Filled 2019-06-09: qty 2
  Filled 2019-06-09: qty 1

## 2019-06-09 MED ORDER — LEVOTHYROXINE SODIUM 25 MCG PO TABS
137.0000 ug | ORAL_TABLET | Freq: Every day | ORAL | Status: DC
  Administered 2019-06-10 – 2019-06-14 (×4): 137 ug via ORAL
  Filled 2019-06-09 (×5): qty 1

## 2019-06-09 MED ORDER — FLEET ENEMA 7-19 GM/118ML RE ENEM: 1.0000 | ENEMA | Freq: Once | RECTAL | Status: DC | PRN

## 2019-06-09 MED ORDER — ACETAMINOPHEN 500 MG PO TABS
500.0000 mg | ORAL_TABLET | Freq: Four times a day (QID) | ORAL | Status: DC
  Administered 2019-06-10 (×3): 500 mg via ORAL
  Filled 2019-06-09 (×3): qty 1

## 2019-06-09 MED ORDER — ACETAMINOPHEN 325 MG PO TABS
325.0000 mg | ORAL_TABLET | Freq: Four times a day (QID) | ORAL | Status: DC | PRN
  Administered 2019-06-11: 650 mg via ORAL
  Filled 2019-06-09 (×3): qty 2

## 2019-06-09 MED ORDER — DOCUSATE SODIUM 100 MG PO CAPS
100.0000 mg | ORAL_CAPSULE | Freq: Two times a day (BID) | ORAL | Status: DC
  Administered 2019-06-10 – 2019-06-13 (×7): 100 mg via ORAL
  Filled 2019-06-09 (×7): qty 1

## 2019-06-09 MED ORDER — HYDROCODONE-ACETAMINOPHEN 5-325 MG PO TABS
1.0000 | ORAL_TABLET | ORAL | Status: DC | PRN
  Administered 2019-06-10: 1 via ORAL
  Filled 2019-06-09: qty 1

## 2019-06-09 MED ORDER — LINAGLIPTIN 5 MG PO TABS
5.0000 mg | ORAL_TABLET | Freq: Every day | ORAL | Status: DC
  Administered 2019-06-10: 5 mg via ORAL
  Filled 2019-06-09: qty 1

## 2019-06-09 MED ORDER — TRAMADOL HCL 50 MG PO TABS
50.0000 mg | ORAL_TABLET | Freq: Four times a day (QID) | ORAL | Status: DC
  Administered 2019-06-10 – 2019-06-14 (×11): 50 mg via ORAL
  Filled 2019-06-09 (×14): qty 1

## 2019-06-09 MED ORDER — PIOGLITAZONE HCL 15 MG PO TABS
15.0000 mg | ORAL_TABLET | Freq: Every day | ORAL | Status: DC
  Administered 2019-06-10: 15 mg via ORAL
  Filled 2019-06-09 (×3): qty 1

## 2019-06-09 MED ORDER — METHOCARBAMOL 500 MG PO TABS
500.0000 mg | ORAL_TABLET | Freq: Four times a day (QID) | ORAL | Status: DC | PRN
  Administered 2019-06-11 – 2019-06-13 (×2): 500 mg via ORAL
  Filled 2019-06-09 (×3): qty 1

## 2019-06-09 MED ORDER — INSULIN ASPART 100 UNIT/ML ~~LOC~~ SOLN
0.0000 [IU] | Freq: Three times a day (TID) | SUBCUTANEOUS | Status: DC
  Administered 2019-06-10: 08:00:00 2 [IU] via SUBCUTANEOUS
  Administered 2019-06-10 (×2): 3 [IU] via SUBCUTANEOUS
  Administered 2019-06-11: 5 [IU] via SUBCUTANEOUS
  Administered 2019-06-11: 3 [IU] via SUBCUTANEOUS
  Administered 2019-06-12 – 2019-06-13 (×6): 2 [IU] via SUBCUTANEOUS
  Administered 2019-06-14: 3 [IU] via SUBCUTANEOUS
  Administered 2019-06-14: 12:00:00 2 [IU] via SUBCUTANEOUS

## 2019-06-09 MED ORDER — VITAMIN D 25 MCG (1000 UNIT) PO TABS
1000.0000 [IU] | ORAL_TABLET | Freq: Every day | ORAL | Status: DC
  Administered 2019-06-10 – 2019-06-13 (×3): 1000 [IU] via ORAL
  Filled 2019-06-09 (×3): qty 1

## 2019-06-09 MED ORDER — DIPHENHYDRAMINE-APAP (SLEEP) 25-500 MG PO TABS: 1.0000 | ORAL_TABLET | Freq: Every evening | ORAL | Status: DC | PRN

## 2019-06-09 NOTE — ED Provider Notes (Signed)
[facility] DEPT Provider Note   CSN: [id number] Arrival date & time: 06/09/19  2056     History   Chief Complaint Chief Complaint  Patient presents with  . Knee Pain    HPI [facility] is a 78 y.o. female.     HPI 78 yo female with mechanical fall today and now has pain in right knee.  Patient with previous knee surgery by Dr. Sharol Given.  Denies other injury.  She lives alone was unable to get up.  She states her neighbor heard her and called EMS.  They transported her here and gave her pain medicine in route.  She denies being on blood thinners or head injury.  Past Medical History:  Diagnosis Date  . Anemia   . Arthritis   . Cancer ([place])    hx of skin ca  . Chronic kidney disease    CRI- sees Dr Posey Pronto  . Diabetes mellitus    oral  . Gout   . H/O hiatal hernia   . Hearing loss   . Hyperlipidemia   . Hypertension   . Hypothyroidism   . Irritable bowel   . Osteoporosis 02/2013   T score -2.9  . PONV (postoperative nausea and vomiting)    yearsago  . Shortness of breath    "mild upon exertion"  . Urinary tract infection    hx of    Patient Active Problem List   Diagnosis Date Noted  . Idiopathic chronic gout, unspecified site, without tophus (tophi) 12/12/2016  . Dyspnea 06/26/2015  . Angina pectoris ([place]) 06/26/2015  . Former smoker 06/26/2015  . Obesity 06/26/2015  . Type 2 diabetes mellitus without complication ([place]) 13/24/4010  . OSA (obstructive sleep apnea) 06/26/2015  . Aortic stenosis 06/26/2015  . Breast cancer of upper-inner quadrant of left female breast ([place]) 09/05/2013  . Osteoporosis 08/22/2013  . GERD (gastroesophageal reflux disease) 08/22/2013  . Hypothyroidism 08/22/2013  . Anxiety 08/22/2013  . Hypertension 08/22/2013  . Diabetes due to undrl condition w oth diabetic kidney comp ([place]) 08/22/2013  . Osteoarthritis of left knee 08/02/2013    Class: Diagnosis of    Past Surgical History:  Procedure  Laterality Date  . ACHILLES TENDON SURGERY    . BREAST LUMPECTOMY     left, with 2 nodes removed  . DILATION AND CURETTAGE OF UTERUS    . EYE SURGERY     bilateral cataract surgery  . SKIN CANCER EXCISION     approx 4 years ago.  Nose  . TONSILLECTOMY    . TOTAL KNEE ARTHROPLASTY  01/04/2012   Procedure: TOTAL KNEE ARTHROPLASTY;  Surgeon: Newt Minion, MD;  Location: [facility];  Service: Orthopedics;  Laterality: Right;  Right Total Knee Arthroplasty  . TOTAL KNEE ARTHROPLASTY Left 07/31/2013   Procedure: LEFT TOTAL KNEE ARTHROPLASTY;  Surgeon: Newt Minion, MD;  Location: [facility];  Service: Orthopedics;  Laterality: Left;  Left Total Knee Arthroplasty     OB History   No obstetric history on file.      Home Medications    Prior to Admission medications   Medication Sig Start Date End Date Taking? Authorizing Provider  alendronate (FOSAMAX) 70 MG tablet Take 70 mg by mouth every 7 (seven) days. Take with a full glass of water on an empty stomach.    [provider]  allopurinol (ZYLOPRIM) 100 MG tablet Take 100 mg by mouth daily.  12/29/14   [provider]  diphenhydramine-acetaminophen (TYLENOL  PM) 25-500 MG TABS Take 1 tablet by mouth at bedtime as needed. For sleep    [provider]  ferrous sulfate 325 (65 FE) MG tablet Take 325 mg by mouth daily with breakfast.    [provider]  hydrochlorothiazide (HYDRODIURIL) 25 MG tablet Take 25 mg by mouth.     [provider]  irbesartan (AVAPRO) 300 MG tablet Take 300 mg by mouth daily.     [provider]  pioglitazone (ACTOS) 15 MG tablet Take 15 mg by mouth daily.     [provider]  sitaGLIPtin (JANUVIA) 100 MG tablet Take 100 mg by mouth daily.    [provider]  SYNTHROID 137 MCG tablet Take 137 mcg by mouth daily before breakfast.  06/09/15   [provider]  timolol (TIMOPTIC) 0.5 % ophthalmic solution Place 1 drop into both eyes daily.  04/20/15    [provider]    Family History Family History  Problem Relation Age of Onset  . Cancer Father        Prostate  . Cancer Sister        Pancreatic cancer  . Cancer Brother        Brain cancer  . Hypertension Brother   . Heart disease Mother     Social History Social History   Tobacco Use  . Smoking status: Former Smoker    Years: 40.00    Types: Cigarettes  . Smokeless tobacco: Never Used  . Tobacco comment: stopped smoking 2009  Substance Use Topics  . Alcohol use: No  . Drug use: No     Allergies   Darvon   Review of Systems Review of Systems  All other systems reviewed and are negative.    Physical Exam Updated Vital Signs BP (!) 156/60 (BP Location: Right Arm)   Pulse 100   Temp 98.6 F (37 C) (Oral)   Resp 20   SpO2 97%   Physical Exam Vitals signs and nursing note reviewed.  Constitutional:      Appearance: Normal appearance. She is obese.  HENT:     Head: Normocephalic.     Nose: Nose normal.     Mouth/Throat:     Mouth: Mucous membranes are moist.  Eyes:     Pupils: Pupils are equal, round, and reactive to light.  Neck:     Musculoskeletal: Normal range of motion.  Cardiovascular:     Rate and Rhythm: Normal rate and regular rhythm.     Pulses: Normal pulses.     Heart sounds: Normal heart sounds.  Pulmonary:     Effort: Pulmonary effort is normal.  Abdominal:     General: Abdomen is flat.  Musculoskeletal: Normal range of motion.     Right knee: She exhibits swelling.  Neurological:     Mental Status: She is alert.      ED Treatments / Results  Labs (all labs ordered are listed, but only abnormal results are displayed) Labs Reviewed - No data to display  EKG None  Radiology Dg Knee Complete 4 Views Right  Result Date: 06/09/2019 CLINICAL DATA:  Pain. EXAM: RIGHT KNEE - COMPLETE 4+ VIEW COMPARISON:  January 04, 2012 FINDINGS: The patient is status post total knee arthroplasty on the right. The hardware appears  grossly intact. There is a displace/distracted transverse fracture through the patella. There is extensive surrounding soft tissue swelling. There is a small to moderate-sized joint effusion. IMPRESSION: 1. Acute displaced transverse fracture through the patella.  2. Status post total knee arthroplasty on the right without evidence for a periprosthetic fracture. Electronically Signed   By: Constance Holster M.D.   On: 06/09/2019 21:54    Procedures Procedures (including critical care time)  Medications Ordered in ED Medications - No data to display   Initial Impression / Assessment and Plan / ED Course  I have reviewed the triage vital signs and the nursing notes.  Pertinent labs & imaging results that were available during my care of the patient were reviewed by me and considered in my medical decision making (see chart for details).       78 year old female with mechanical fall tonight.  Presents with right knee pain.  Right knee x-Damika Harmon shows transverse patellar fracture with displacement. Discussed with Dr. Louanne Skye, on-call for Dr. Sharol Given and he will see and admit patient 1 fall 2 patellar fracture 3 anemia stable from prior 4 chronic kidney disease with stable creatinine  Final Clinical Impressions(s) / ED Diagnoses   Final diagnoses:  Fall, initial encounter  Closed nondisplaced transverse fracture of right patella, initial encounter    ED Discharge Orders    None       Pattricia Boss, MD 06/09/19 2221

## 2019-06-09 NOTE — ED Triage Notes (Signed)
Pt from home with EMS fell onto right knee when trying to lower the umbrella on her deck. Pt has not been able to place weight onto RLE since fall. No obvious deformity noted by EMS.

## 2019-06-09 NOTE — H&P (Signed)
PREOPERATIVE H&P  Chief Complaint: Right displaced patella fracture, transverse, displaced, closed, periprosthetic TKR.  HPI: Autumn Chambers is a 78 y.o. female who presents for preoperative history and physical with a diagnosis of Right displaced patella fracture, transverse, displaced, closed, periprosthetic TKR. Marland Kitchen Symptoms are rated as moderate to severe, and have been worsening.  This is significantly impairing activities of daily living. Fell at 5 PM this evening at home on the deck. Neighbors helped her, she was unable to stand with right knee pain and inability ot move the right knee.   She has elected for admission for evaluation for  surgical management.  She lives alone and not able to care for her self or walk due to right knee pain at this time. Fall was mechanical.   Past Medical History:  Diagnosis Date  . Anemia   . Arthritis   . Cancer ([place])    hx of skin ca  . Chronic kidney disease    CRI- sees Dr Posey Pronto  . Diabetes mellitus    oral  . Gout   . H/O hiatal hernia   . Hearing loss   . Hyperlipidemia   . Hypertension   . Hypothyroidism   . Irritable bowel   . Osteoporosis 02/2013   T score -2.9  . PONV (postoperative nausea and vomiting)    yearsago  . Shortness of breath    "mild upon exertion"  . Urinary tract infection    hx of   Past Surgical History:  Procedure Laterality Date  . ACHILLES TENDON SURGERY    . BREAST LUMPECTOMY     left, with 2 nodes removed  . DILATION AND CURETTAGE OF UTERUS    . EYE SURGERY     bilateral cataract surgery  . SKIN CANCER EXCISION     approx 4 years ago.  Nose  . TONSILLECTOMY    . TOTAL KNEE ARTHROPLASTY  01/04/2012   Procedure: TOTAL KNEE ARTHROPLASTY;  Surgeon: Newt Minion, MD;  Location: [facility];  Service: Orthopedics;  Laterality: Right;  Right Total Knee Arthroplasty  . TOTAL KNEE ARTHROPLASTY Left 07/31/2013   Procedure: LEFT TOTAL KNEE ARTHROPLASTY;  Surgeon: Newt Minion, MD;  Location: [facility];  Service:  Orthopedics;  Laterality: Left;  Left Total Knee Arthroplasty   Social History   Socioeconomic History  . Marital status: Single    Spouse name: Not on file  . Number of children: Not on file  . Years of education: Not on file  . Highest education level: Not on file  Occupational History  . Not on file  Social Needs  . Financial resource strain: Not on file  . Food insecurity    Worry: Not on file    Inability: Not on file  . Transportation needs    Medical: Not on file    Non-medical: Not on file  Tobacco Use  . Smoking status: Former Smoker    Years: 40.00    Types: Cigarettes  . Smokeless tobacco: Never Used  . Tobacco comment: stopped smoking 2009  Substance and Sexual Activity  . Alcohol use: No  . Drug use: No  . Sexual activity: Not on file  Lifestyle  . Physical activity    Days per week: Not on file    Minutes per session: Not on file  . Stress: Not on file  Relationships  . Social Herbalist on phone: Not on file    Gets together: Not on file  Attends religious service: Not on file    Active member of club or organization: Not on file    Attends meetings of clubs or organizations: Not on file    Relationship status: Not on file  Other Topics Concern  . Not on file  Social History Narrative  . Not on file   Family History  Problem Relation Age of Onset  . Cancer Father        Prostate  . Cancer Sister        Pancreatic cancer  . Cancer Brother        Brain cancer  . Hypertension Brother   . Heart disease Mother    Allergies  Allergen Reactions  . Darvon Other (See Comments)    Gets her "high"   Prior to Admission medications   Medication Sig Start Date End Date Taking? Authorizing Provider  alendronate (FOSAMAX) 70 MG tablet Take 70 mg by mouth every 7 (seven) days. Take with a full glass of water on an empty stomach.    [provider]  allopurinol (ZYLOPRIM) 100 MG tablet Take 100 mg by mouth daily.  12/29/14    [provider]  diphenhydramine-acetaminophen (TYLENOL PM) 25-500 MG TABS Take 1 tablet by mouth at bedtime as needed. For sleep    [provider]  ferrous sulfate 325 (65 FE) MG tablet Take 325 mg by mouth daily with breakfast.    [provider]  hydrochlorothiazide (HYDRODIURIL) 25 MG tablet Take 25 mg by mouth.     [provider]  irbesartan (AVAPRO) 300 MG tablet Take 300 mg by mouth daily.     [provider]  pioglitazone (ACTOS) 15 MG tablet Take 15 mg by mouth daily.     [provider]  sitaGLIPtin (JANUVIA) 100 MG tablet Take 100 mg by mouth daily.    [provider]  SYNTHROID 137 MCG tablet Take 137 mcg by mouth daily before breakfast.  06/09/15   [provider]  timolol (TIMOPTIC) 0.5 % ophthalmic solution Place 1 drop into both eyes daily.  04/20/15   [provider]     Positive ROS: All other systems have been reviewed and were otherwise negative with the exception of those mentioned in the HPI and as above.  Physical Exam: General: Alert, no acute distress Cardiovascular: No pedal edema Respiratory: No cyanosis, no use of accessory musculature GI: No organomegaly, abdomen is soft and non-tender Skin: No lesions in the area of chief complaint Neurologic: Sensation intact distally Psychiatric: Patient is competent for consent with normal mood and affect Lymphatic: No axillary or cervical lymphadenopathy  MUSCULOSKELETAL:Atraumatic upper extremities. Right knee with ecchymosis and swelling and right knee effusion present. The knee is stable to varus and valgus stress. Negative anterior drawer.  Right foot is neurovascular normal. Sensation and motor intact but unable to extend actively the right knee due to disruption of the extensor mechanism. Radiographs with right TKR with displaced transverse patella fracture at the lower 1/3 of the patella implant. The patella implant appears fixed to the  upper patella fragment. AP with lateral displacement of the upper patella fragment. Right knee effusion. Both the femoral and tibial components are in good position and appear well fixed with no sign of loosening. DP2+, PT 2+  Assessment: Right displaced patella fracture, transverse, displaced, closed, periprosthetic TKR. Shortness of breath with bedrest, HTN, diabetes, history of angina pectoralis, Dr. Carolyne Littles is her cardiologist. Stage 3 CRF.  Plan: Plan for admit, medical evaluation  preop for ORIF of right patella fracture, periprosthetic right TKR.   The risks benefits and alternatives were discussed with the patient including but not limited to the risks of nonoperative treatment, versus surgical intervention including infection, bleeding, nerve injury,  blood clots, cardiopulmonary complications, morbidity, mortality, among others, and they were willing to proceed.   [name], MD Cell [phone] Office [phone] 06/09/2019 11:23 PM

## 2019-06-09 NOTE — ED Notes (Signed)
Patient transported to X-ray 

## 2019-06-10 ENCOUNTER — Encounter (HOSPITAL_COMMUNITY): Payer: Self-pay

## 2019-06-10 ENCOUNTER — Inpatient Hospital Stay (HOSPITAL_COMMUNITY): Payer: Medicare Other

## 2019-06-10 ENCOUNTER — Other Ambulatory Visit: Payer: Self-pay

## 2019-06-10 DIAGNOSIS — R0609 Other forms of dyspnea: Secondary | ICD-10-CM

## 2019-06-10 DIAGNOSIS — R0600 Dyspnea, unspecified: Secondary | ICD-10-CM

## 2019-06-10 LAB — GLUCOSE, CAPILLARY
Glucose-Capillary: 145 mg/dL — ABNORMAL HIGH (ref 70–99)
Glucose-Capillary: 161 mg/dL — ABNORMAL HIGH (ref 70–99)
Glucose-Capillary: 143 mg/dL — ABNORMAL HIGH (ref 70–99)
Glucose-Capillary: 141 mg/dL — ABNORMAL HIGH (ref 70–99)

## 2019-06-10 LAB — BASIC METABOLIC PANEL
Anion gap: 10 (ref 5–15)
BUN: 27 mg/dL — ABNORMAL HIGH (ref 8–23)
CO2: 20 mmol/L — ABNORMAL LOW (ref 22–32)
Calcium: 9.1 mg/dL (ref 8.9–10.3)
Chloride: 106 mmol/L (ref 98–111)
Creatinine, Ser: 1.25 mg/dL — ABNORMAL HIGH (ref 0.44–1.00)
GFR calc Af Amer: 48 mL/min — ABNORMAL LOW (ref >60)
GFR calc non Af Amer: 41 mL/min — ABNORMAL LOW (ref >60)
Glucose, Bld: 175 mg/dL — ABNORMAL HIGH (ref 70–99)
Potassium: 4.4 mmol/L (ref 3.5–5.1)
Sodium: 136 mmol/L (ref 135–145)

## 2019-06-10 LAB — CBC
HCT: 33.6 % — ABNORMAL LOW (ref 36.0–46.0)
Hemoglobin: 10.6 g/dL — ABNORMAL LOW (ref 12.0–15.0)
MCH: 30.7 pg (ref 26.0–34.0)
MCHC: 31.5 g/dL (ref 30.0–36.0)
MCV: 97.4 fL (ref 80.0–100.0)
Platelets: 253 10*3/uL (ref 150–400)
RBC: 3.45 MIL/uL — ABNORMAL LOW (ref 3.87–5.11)
RDW: 14.7 % (ref 11.5–15.5)
WBC: 9.9 10*3/uL (ref 4.0–10.5)
nRBC: 0 % (ref 0.0–0.2)

## 2019-06-10 LAB — ECHOCARDIOGRAM COMPLETE
Height: 66 in
Weight: 4204.61 oz

## 2019-06-10 LAB — HEMOGLOBIN A1C
Hgb A1c MFr Bld: 7.4 % — ABNORMAL HIGH (ref 4.8–5.6)
Mean Plasma Glucose: 165.68 mg/dL

## 2019-06-10 LAB — TROPONIN I (HIGH SENSITIVITY): Troponin I (High Sensitivity): 17 ng/L (ref <18)

## 2019-06-10 LAB — SARS CORONAVIRUS 2 BY RT PCR (HOSPITAL ORDER, PERFORMED IN ~~LOC~~ HOSPITAL LAB): SARS Coronavirus 2: NEGATIVE

## 2019-06-10 LAB — TSH: TSH: 0.719 u[IU]/mL (ref 0.350–4.500)

## 2019-06-10 LAB — BRAIN NATRIURETIC PEPTIDE: B Natriuretic Peptide: 57.9 pg/mL (ref 0.0–100.0)

## 2019-06-10 MED ORDER — CEFAZOLIN SODIUM-DEXTROSE 2-4 GM/100ML-% IV SOLN
2.0000 g | INTRAVENOUS | Status: AC
  Administered 2019-06-11: 13:00:00 2 g via INTRAVENOUS
  Filled 2019-06-10: qty 100

## 2019-06-10 MED ORDER — BUPIVACAINE LIPOSOME 1.3 % IJ SUSP
20.0000 mL | Freq: Once | INTRAMUSCULAR | Status: DC
  Filled 2019-06-10: qty 20

## 2019-06-10 MED ORDER — DIPHENHYDRAMINE HCL 25 MG PO CAPS: 25.0000 mg | ORAL_CAPSULE | Freq: Every evening | ORAL | Status: DC | PRN

## 2019-06-10 MED ORDER — ACETAMINOPHEN 500 MG PO TABS: 500.0000 mg | ORAL_TABLET | Freq: Every evening | ORAL | Status: DC | PRN

## 2019-06-10 MED ORDER — SODIUM CHLORIDE 0.9 % IV SOLN: INTRAVENOUS | Status: DC

## 2019-06-10 MED ORDER — CHLORHEXIDINE GLUCONATE 4 % EX LIQD
60.0000 mL | Freq: Once | CUTANEOUS | Status: AC
  Administered 2019-06-11: 4 via TOPICAL
  Filled 2019-06-10: qty 15

## 2019-06-10 MED ORDER — HYDRALAZINE HCL 20 MG/ML IJ SOLN: 10.0000 mg | Freq: Four times a day (QID) | INTRAMUSCULAR | Status: DC | PRN

## 2019-06-10 MED ORDER — POVIDONE-IODINE 10 % EX SWAB: 2.0000 "application " | Freq: Once | CUTANEOUS | Status: AC

## 2019-06-10 MED ORDER — POVIDONE-IODINE 10 % EX SWAB
2.0000 "application " | Freq: Once | CUTANEOUS | Status: AC
  Administered 2019-06-11: 2 via TOPICAL

## 2019-06-10 NOTE — Progress Notes (Addendum)
     Subjective:   Procedure(s) (LRB): OPEN REDUCTION INTERNAL (ORIF) FIXATION RIGHT PATELLA FRACTURE WITH PINS, WIRE, CANNULATED SCREWS, CERCLAGE WIRE PATELLA TO TIBIA (Right) Awake,alert and oriented x 4. Echocardiogram was to be done when seen this afternoon at 1330 HRs. ECHO with mild aortic calcification, EF 60-65 %. She is likely low risk for surgery scheduled for 1230 PM Tuesday.  Patient reports pain as moderate.    Objective:   VITALS:  Temp:  [97.6 F (36.4 C)-98.6 F (37 C)] 98.1 F (36.7 C) (08/10 1411) Pulse Rate:  [90-104] 90 (08/10 1411) Resp:  [15-21] 16 (08/10 1411) BP: (123-173)/(59-95) 123/61 (08/10 1411) SpO2:  [96 %-99 %] 98 % (08/10 1411) Weight:  [119.2 kg] 119.2 kg (08/10 0203)  Neurologically intact ABD soft Neurovascular intact Sensation intact distally Intact pulses distally Right knee immobilizer intact   LABS Recent Labs    06/09/19 2153 06/10/19 0246  HGB 11.2* 10.6*  WBC 12.0* 9.9  PLT 253 253   Recent Labs    06/09/19 2153 06/10/19 0246  NA 137 136  K 4.7 4.4  CL 108 106  CO2 19* 20*  BUN 29* 27*  CREATININE 1.25* 1.25*  GLUCOSE 211* 175*   No results for input(s): LABPT, INR in the last 72 hours.   Assessment/Plan:   Procedure(s) (LRB): OPEN REDUCTION INTERNAL (ORIF) FIXATION RIGHT PATELLA FRACTURE WITH PINS, WIRE, CANNULATED SCREWS, CERCLAGE WIRE PATELLA TO TIBIA (Right)  Advance diet Up with therapy Plan for OR tomorrow at 1230 at [facility]. NPO past 3 AM.   [name] 06/10/2019, 7:26 PMPatient ID: Jacklynn Lewis, female   DOB: [birth year], 78 y.o.   MRN: [id number]

## 2019-06-10 NOTE — Consult Note (Signed)
[facility] Consult H&P    Patient Demographics:    Autumn Chambers, is a 78 y.o. female  MRN: [id number]  DOB - 12-02-1940  Admit Date - 06/09/2019  Referring MD/NP/PA:  [name]  Outpatient Primary MD for the patient is Kathyrn Lass, MD Candee Furbish - cardiology Eston Esters - oncology => Autumn Chambers - oncology  Patient coming from:  home  Chief complaint-  Sob ?   HPI:    Demiya Chambers  is a 78 y.o. female,  w h/o L breast carcinoma s/p left lumpectomy w sentinel node dissection 05/29/2008 s/p XRT,  hypothyroidism, hypertension, hyperlipidemia,  Dm2, CKD stage3, Anemia, Gout, apparently had mechanical fall at home.    Pt seen by orthopedics, concerned about ? Sob.  In ED T 98.6, P 100 R 20, Bp 156/60  Pox 97% on RA  CXR IMPRESSION: No active disease.  covid - 19, negative  Wbc 12.0, hgb 11.2, Plt 253 Na 137, K 4.7, Bun 29, Creatinine 1.25 Ast 18, Alt 18 Hco3 19 Glucose 211  Pt denies dyspnea but does have heart murmer on exam.     Review of systems:    In addition to the HPI above,  No Fever-chills, No Headache, No changes with Vision or hearing, No problems swallowing food or Liquids, No Chest pain, Cough or Shortness of Breath, No Abdominal pain, No Nausea or Vomiting, bowel movements are regular, No Blood in stool or Urine, No dysuria, No new skin rashes or bruises, No new joints pains-aches,  No new weakness, tingling, numbness in any extremity, No recent weight gain or loss, No polyuria, polydypsia or polyphagia, No significant Mental Stressors.  All other systems reviewed and are negative.    Past History of the following :    Past Medical History:  Diagnosis Date  . Anemia   . Arthritis   . Cancer ([place])    hx of skin ca  . Chronic kidney disease    CRI- sees Dr Posey Pronto  . Diabetes mellitus    oral  . Gout   . H/O hiatal hernia   . Hearing loss   .  Hyperlipidemia   . Hypertension   . Hypothyroidism   . Irritable bowel   . Osteoporosis 02/2013   T score -2.9  . PONV (postoperative nausea and vomiting)    yearsago  . Shortness of breath    "mild upon exertion"  . Urinary tract infection    hx of      Past Surgical History:  Procedure Laterality Date  . ACHILLES TENDON SURGERY    . BREAST LUMPECTOMY     left, with 2 nodes removed  . DILATION AND CURETTAGE OF UTERUS    . EYE SURGERY     bilateral cataract surgery  . SKIN CANCER EXCISION     approx 4 years ago.  Nose  . TONSILLECTOMY    . TOTAL KNEE ARTHROPLASTY  01/04/2012   Procedure: TOTAL KNEE ARTHROPLASTY;  Surgeon: Newt Minion, MD;  Location: [facility];  Service: Orthopedics;  Laterality:  Right;  Right Total Knee Arthroplasty  . TOTAL KNEE ARTHROPLASTY Left 07/31/2013   Procedure: LEFT TOTAL KNEE ARTHROPLASTY;  Surgeon: Newt Minion, MD;  Location: [facility];  Service: Orthopedics;  Laterality: Left;  Left Total Knee Arthroplasty      Social History:      Social History   Tobacco Use  . Smoking status: Former Smoker    Years: 40.00    Types: Cigarettes  . Smokeless tobacco: Never Used  . Tobacco comment: stopped smoking 2009  Substance Use Topics  . Alcohol use: No       Family History :     Family History  Problem Relation Age of Onset  . Cancer Father        Prostate  . Cancer Sister        Pancreatic cancer  . Cancer Brother        Brain cancer  . Hypertension Brother   . Heart disease Mother       Home Medications:   Prior to Admission medications   Medication Sig Start Date End Date Taking? Authorizing Provider  alendronate (FOSAMAX) 70 MG tablet Take 70 mg by mouth every 7 (seven) days. Take with a full glass of water on an empty stomach.   Yes [provider]  allopurinol (ZYLOPRIM) 100 MG tablet Take 100 mg by mouth daily after breakfast.  12/29/14  Yes [provider]  cholecalciferol (VITAMIN D3) 25 MCG (1000 UT) tablet  Take 1,000 Units by mouth daily.   Yes [provider]  diphenhydramine-acetaminophen (TYLENOL PM) 25-500 MG TABS Take 1 tablet by mouth at bedtime as needed (sleep).    Yes [provider]  ferrous sulfate 325 (65 FE) MG tablet Take 325 mg by mouth daily with breakfast.   Yes [provider]  hydrochlorothiazide (HYDRODIURIL) 25 MG tablet Take 25 mg by mouth daily after breakfast.    Yes [provider]  irbesartan (AVAPRO) 300 MG tablet Take 300 mg by mouth daily after breakfast.    Yes [provider]  ketoconazole (NIZORAL) 2 % cream Apply 1 application topically 2 (two) times daily as needed for irritation.  06/01/19  Yes [provider]  Multiple Vitamins-Minerals (PRESERVISION AREDS 2 PO) Take 1 capsule by mouth 2 (two) times daily.   Yes [provider]  pioglitazone (ACTOS) 15 MG tablet Take 15 mg by mouth daily after breakfast.    Yes [provider]  sitaGLIPtin (JANUVIA) 100 MG tablet Take 100 mg by mouth daily after breakfast.    Yes [provider]  SYNTHROID 137 MCG tablet Take 137 mcg by mouth daily before breakfast.  06/09/15  Yes [provider]  timolol (TIMOPTIC) 0.5 % ophthalmic solution Place 1 drop into both eyes 2 (two) times daily.  04/20/15  Yes [provider]     Allergies:     Allergies  Allergen Reactions  . Darvon Other (See Comments)    Gets her "high"     Physical Exam:   Vitals  Blood pressure (!) 173/95, pulse (!) 104, temperature 98.6 F (37 C), temperature source Oral, resp. rate 20, SpO2 97 %.  1.  General: axoxo3  2. Psychiatric: euthymic  3. Neurologic: nonfocal  4. HEENMT:  Anicteric, pupils 1.65mm symmetric, direct, consensual, near intact  5. Respiratory : CTAB  6. Cardiovascular : Borderline tachy, s1, s2, no m/g/r  7. Gastrointestinal:  Abd: soft, nt, nd, +bs  8. Skin:  Ext: no c/c/e,  No rash  9.Musculoskeletal:  Pt unable to  move right knee,      Data Review:    CBC Recent Labs  Lab 06/09/19 2153  WBC 12.0*  HGB 11.2*  HCT 35.3*  PLT 253  MCV 97.5  MCH 30.9  MCHC 31.7  RDW 14.7   ------------------------------------------------------------------------------------------------------------------  Results for orders placed or performed during the hospital encounter of 06/09/19 (from the past 48 hour(s))  CBC     Status: Abnormal   Collection Time: 06/09/19  9:53 PM  Result Value Ref Range   WBC 12.0 (H) 4.0 - 10.5 K/uL   RBC 3.62 (L) 3.87 - 5.11 MIL/uL   Hemoglobin 11.2 (L) 12.0 - 15.0 g/dL   HCT 35.3 (L) 36.0 - 46.0 %   MCV 97.5 80.0 - 100.0 fL   MCH 30.9 26.0 - 34.0 pg   MCHC 31.7 30.0 - 36.0 g/dL   RDW 14.7 11.5 - 15.5 %   Platelets 253 150 - 400 K/uL   nRBC 0.0 0.0 - 0.2 %    Comment: Performed at [facility], [place] [address] [place], [place] [address]  Comprehensive metabolic panel     Status: Abnormal   Collection Time: 06/09/19  9:53 PM  Result Value Ref Range   Sodium 137 135 - 145 mmol/L   Potassium 4.7 3.5 - 5.1 mmol/L   Chloride 108 98 - 111 mmol/L   CO2 19 (L) 22 - 32 mmol/L   Glucose, Bld 211 (H) 70 - 99 mg/dL   BUN 29 (H) 8 - 23 mg/dL   Creatinine, Ser 1.25 (H) 0.44 - 1.00 mg/dL   Calcium 9.6 8.9 - 10.3 mg/dL   Total Protein 7.6 6.5 - 8.1 g/dL   Albumin 3.7 3.5 - 5.0 g/dL   AST 18 15 - 41 U/L   ALT 18 0 - 44 U/L   Alkaline Phosphatase 74 38 - 126 U/L   Total Bilirubin 0.6 0.3 - 1.2 mg/dL   GFR calc non Af Amer 41 (L) >60 mL/min   GFR calc Af Amer 48 (L) >60 mL/min   Anion gap 10 5 - 15    Comment: Performed at [facility], [address] [place], [place] [address]    Chemistries  Recent Labs  Lab 06/09/19 2153  NA 137  K 4.7  CL 108  CO2 19*  GLUCOSE 211*  BUN 29*  CREATININE 1.25*  CALCIUM 9.6  AST 18  ALT 18  ALKPHOS 74  BILITOT 0.6    ------------------------------------------------------------------------------------------------------------------  ------------------------------------------------------------------------------------------------------------------ GFR: CrCl cannot be calculated (Unknown ideal weight.). Liver Function Tests: Recent Labs  Lab 06/09/19 2153  AST 18  ALT 18  ALKPHOS 74  BILITOT 0.6  PROT 7.6  ALBUMIN 3.7   No results for input(s): LIPASE, AMYLASE in the last 168 hours. No results for input(s): AMMONIA in the last 168 hours. Coagulation Profile: No results for input(s): INR, PROTIME in the last 168 hours. Cardiac Enzymes: No results for input(s): CKTOTAL, CKMB, CKMBINDEX, TROPONINI in the last 168 hours. BNP (last 3 results) No results for input(s): PROBNP in the last 8760 hours. HbA1C: No results for input(s): HGBA1C in the last 72 hours. CBG: No results for input(s): GLUCAP in the last 168 hours. Lipid Profile: No results for input(s): CHOL, HDL, LDLCALC, TRIG, CHOLHDL, LDLDIRECT in the last 72 hours. Thyroid Function Tests: No results for input(s): TSH, T4TOTAL, FREET4, T3FREE, THYROIDAB in the last 72 hours. Anemia Panel: No results for  input(s): VITAMINB12, FOLATE, FERRITIN, TIBC, IRON, RETICCTPCT in the last 72 hours.  --------------------------------------------------------------------------------------------------------------- Urine analysis:    Component Value Date/Time   COLORURINE YELLOW 05/26/2008 1453   APPEARANCEUR CLOUDY (A) 05/26/2008 1453   LABSPEC 1.017 05/26/2008 1453   PHURINE 6.0 05/26/2008 1453   GLUCOSEU NEGATIVE 05/26/2008 1453   HGBUR NEGATIVE 05/26/2008 1453   BILIRUBINUR NEGATIVE 05/26/2008 1453   KETONESUR NEGATIVE 05/26/2008 1453   PROTEINUR NEGATIVE 05/26/2008 1453   UROBILINOGEN 0.2 05/26/2008 1453   NITRITE NEGATIVE 05/26/2008 1453   LEUKOCYTESUR LARGE (A) 05/26/2008 1453      Imaging Results:    Dg Chest Port 1 View  Result Date:  06/09/2019 CLINICAL DATA:  78 year old female with fall and fracture of the patella. Preop evaluation. EXAM: PORTABLE CHEST 1 VIEW COMPARISON:  Chest radiograph dated 05/25/2015 FINDINGS: The lungs are clear. There is no pleural effusion or pneumothorax. The cardiac silhouette is within normal limits. No acute osseous pathology. IMPRESSION: No active disease. Electronically Signed   By: Anner Crete M.D.   On: 06/09/2019 22:43   Dg Knee Complete 4 Views Right  Result Date: 06/09/2019 CLINICAL DATA:  Pain. EXAM: RIGHT KNEE - COMPLETE 4+ VIEW COMPARISON:  January 04, 2012 FINDINGS: The patient is status post total knee arthroplasty on the right. The hardware appears grossly intact. There is a displace/distracted transverse fracture through the patella. There is extensive surrounding soft tissue swelling. There is a small to moderate-sized joint effusion. IMPRESSION: 1. Acute displaced transverse fracture through the patella. 2. Status post total knee arthroplasty on the right without evidence for a periprosthetic fracture. Electronically Signed   By: Constance Holster M.D.   On: 06/09/2019 21:54       Assessment & Plan:    Active Problems:   Closed displaced fracture of right patella  R patellar fracture Orthopedic surgery consulted by ED, per Nit[facility] likely to OR Tuesday Defer to surgery regarding pain control and DVT prophylaxis  ? Sob per orthopedics, pt denies sob at current time CXR negative Check bnp Check tsh Check cardiac echo   Tachycardia most likely due to pain (prior nuclear stress test 07/02/2015 low risk) Tele Trop I q3h x2  Cardiac murmer Check cardiac echo  Hypothyroidism Check tsh Cont levothyroxine  DM2 Cont Actos  Cont Januvia fsbs ac and qhs, ISS  Hypertension Cont Irbesartan 300mg  poq day Cont Hydrochlorothiaizide 25mg  po qday  H/o Anemia Con Ferrous sulfate   H/o Gout Cont Allopurinol 100mg  po qday  Osteoporosis  DC Fosamax, recommend Prolia or  another antiresportive as outpatient  Glaucoma Cont Timoptic  DVT Prophylaxis-   Lovenox - SCDs   AM Labs Ordered, also please review Full Orders  Family Communication: Admission, patients condition and plan of care including tests being ordered have been discussed with the patient  who indicate understanding and agree with the plan and Code Status.  Code Status:  FULL CODE<    Admission status: Inpatient:  Based on patients clinical presentation and evaluation of above clinical data, I have made determination that patient meets Inpatient criteria at this time. I would agree with orthopedics that patient will likely need > 2 nites admission  Time spent in minutes : 50 minutes on consult    Jani Gravel M.D on 06/10/2019 at 12:12 AM

## 2019-06-10 NOTE — ED Notes (Signed)
Dr Louanne Skye at bedside and is placing a dressing and knee immobilizer to pt's right knee.

## 2019-06-10 NOTE — Progress Notes (Signed)
Patient is a 78 year old Caucasian female admitted under orthopedic service for ORIF of displaced right patella fracture.  Medical consultation was called for comanagement. Please see detailed consultation note from earlier this morning. Patient was seen and examined by me this morning again. Extremely hard of hearing, not in pain, not in distress.  On strict bedrest.  Cardiac echo was ordered, not resulted yet. Diabetes mellitus -continue Actos, Januvia, sliding scale insulin and Accu-Cheks.  Hypertension -patient takes a irbesartan and hydrochlorothiazide at home.  I will hold these medicines perioperatively.  If renal function is not impaired, can resume post procedure.  Hydralazine PRN.  Continue other medicines.  We will follow along.

## 2019-06-10 NOTE — Progress Notes (Signed)
  Echocardiogram 2D Echocardiogram has been performed.  Jannett Celestine 06/10/2019, 3:14 PM

## 2019-06-10 NOTE — Plan of Care (Signed)

## 2019-06-10 NOTE — Progress Notes (Signed)
Orthopedic Tech Progress Note Patient Details:  Autumn Chambers 1941-03-22 [id number] Pt already has immobilizer on.         Ladell [facility] 06/10/2019, 12:32 PM

## 2019-06-10 NOTE — ED Notes (Signed)
ED TO INPATIENT HANDOFF REPORT  Name/Age/Gender Autumn Chambers 78 y.o. female  Code Status    Code Status Orders  (From admission, onward)         Start     Ordered   06/09/19 2341  Full code  Continuous     06/09/19 2348        Code Status History    Date Active Date Inactive Code Status Order ID Comments User Context   07/31/2013 1547 08/03/2013 1525 Full Code [id number]  Newt Minion, MD Inpatient   01/04/2012 1418 01/07/2012 1655 Full Code [id number]  Grandville Silos, RN Inpatient   Advance Care Planning Activity      Home/SNF/Other Home  Chief Complaint Fall  Level of Care/Admitting Diagnosis ED Disposition    ED Disposition Condition [place]: [facility] [[id number]]  Level of Care: Med-Surg [16]  Covid Evaluation: Confirmed COVID Negative  Diagnosis: Closed displaced fracture of right patella, unspecified fracture morphology, initial encounter [[id number]]  Admitting Physician: Jessy Oto [[id number]]  Attending Physician: [name] E [[id number]]  Estimated length of stay: 3 - 4 days  Certification:: I certify this patient will need inpatient services for at least 2 midnights  Bed request comments: Ortho floor 6E, 5W,5E  PT Class (Do Not Modify): Inpatient [101]  PT Acc Code (Do Not Modify): Private [1]       Medical History Past Medical History:  Diagnosis Date  . Anemia   . Arthritis   . Cancer ([place])    hx of skin ca  . Chronic kidney disease    CRI- sees Dr Posey Pronto  . Diabetes mellitus    oral  . Gout   . H/O hiatal hernia   . Hearing loss   . Hyperlipidemia   . Hypertension   . Hypothyroidism   . Irritable bowel   . Osteoporosis 02/2013   T score -2.9  . PONV (postoperative nausea and vomiting)    yearsago  . Shortness of breath    "mild upon exertion"  . Urinary tract infection    hx of    Allergies Allergies  Allergen Reactions  . Darvon Other (See Comments)    Gets her "high"    IV  Location/Drains/Wounds Patient Lines/Drains/Airways Status   Active Line/Drains/Airways    Name:   Placement date:   Placement time:   Site:   Days:   Peripheral IV 06/09/19 Right Antecubital   06/09/19    -    Antecubital   1   Incision 01/04/12 Knee Right   01/04/12    1156     2714   Incision 07/31/13 Knee Left   07/31/13    0855     2140          Labs/Imaging Results for orders placed or performed during the hospital encounter of 06/09/19 (from the past 48 hour(s))  CBC     Status: Abnormal   Collection Time: 06/09/19  9:53 PM  Result Value Ref Range   WBC 12.0 (H) 4.0 - 10.5 K/uL   RBC 3.62 (L) 3.87 - 5.11 MIL/uL   Hemoglobin 11.2 (L) 12.0 - 15.0 g/dL   HCT 35.3 (L) 36.0 - 46.0 %   MCV 97.5 80.0 - 100.0 fL   MCH 30.9 26.0 - 34.0 pg   MCHC 31.7 30.0 - 36.0 g/dL   RDW 14.7 11.5 - 15.5 %   Platelets 253 150 - 400 K/uL  nRBC 0.0 0.0 - 0.2 %    Comment: Performed at [facility], [place] [address], [place], [place] [address]  Comprehensive metabolic panel     Status: Abnormal   Collection Time: 06/09/19  9:53 PM  Result Value Ref Range   Sodium 137 135 - 145 mmol/L   Potassium 4.7 3.5 - 5.1 mmol/L   Chloride 108 98 - 111 mmol/L   CO2 19 (L) 22 - 32 mmol/L   Glucose, Bld 211 (H) 70 - 99 mg/dL   BUN 29 (H) 8 - 23 mg/dL   Creatinine, Ser 1.25 (H) 0.44 - 1.00 mg/dL   Calcium 9.6 8.9 - 10.3 mg/dL   Total Protein 7.6 6.5 - 8.1 g/dL   Albumin 3.7 3.5 - 5.0 g/dL   AST 18 15 - 41 U/L   ALT 18 0 - 44 U/L   Alkaline Phosphatase 74 38 - 126 U/L   Total Bilirubin 0.6 0.3 - 1.2 mg/dL   GFR calc non Af Amer 41 (L) >60 mL/min   GFR calc Af Amer 48 (L) >60 mL/min   Anion gap 10 5 - 15    Comment: Performed at [facility], [place] [address], [place], [place] [address]  SARS Coronavirus 2 [facility] order, Performed in [facility] hospital lab) Nasopharyngeal Nasopharyngeal Swab     Status: None   Collection Time: 06/09/19  9:55 PM   Specimen:  Nasopharyngeal Swab  Result Value Ref Range   SARS Coronavirus 2 NEGATIVE NEGATIVE    Comment: (NOTE) If result is NEGATIVE SARS-CoV-2 target nucleic acids are NOT DETECTED. The SARS-CoV-2 RNA is generally detectable in upper and lower  respiratory specimens during the acute phase of infection. The lowest  concentration of SARS-CoV-2 viral copies this assay can detect is 250  copies / mL. A negative result does not preclude SARS-CoV-2 infection  and should not be used as the sole basis for treatment or other  patient management decisions.  A negative result may occur with  improper specimen collection / handling, submission of specimen other  than nasopharyngeal swab, presence of viral mutation(s) within the  areas targeted by this assay, and inadequate number of viral copies  (<250 copies / mL). A negative result must be combined with clinical  observations, patient history, and epidemiological information. If result is POSITIVE SARS-CoV-2 target nucleic acids are DETECTED. The SARS-CoV-2 RNA is generally detectable in upper and lower  respiratory specimens dur ing the acute phase of infection.  Positive  results are indicative of active infection with SARS-CoV-2.  Clinical  correlation with patient history and other diagnostic information is  necessary to determine patient infection status.  Positive results do  not rule out bacterial infection or co-infection with other viruses. If result is PRESUMPTIVE POSTIVE SARS-CoV-2 nucleic acids MAY BE PRESENT.   A presumptive positive result was obtained on the submitted specimen  and confirmed on repeat testing.  While 2019 novel coronavirus  (SARS-CoV-2) nucleic acids may be present in the submitted sample  additional confirmatory testing may be necessary for epidemiological  and / or clinical management purposes  to differentiate between  SARS-CoV-2 and other Sarbecovirus currently known to infect humans.  If clinically indicated  additional testing with an alternate test  methodology [phone]) is advised. The SARS-CoV-2 RNA is generally  detectable in upper and lower respiratory sp ecimens during the acute  phase of infection. The expected result is Negative. Fact Sheet for Patients:  StrictlyIdeas.no Fact Sheet for Healthcare  Providers: BankingDealers.co.za This test is not yet approved or cleared by the [place] and has been authorized for detection and/or diagnosis of SARS-CoV-2 by FDA under an Emergency Use Authorization (EUA).  This EUA will remain in effect (meaning this test can be used) for the duration of the COVID-19 declaration under Section 564(b)(1) of the Act, 21 U.S.C. section 360bbb-3(b)(1), unless the authorization is terminated or revoked sooner. Performed at [facility], [place] [address] [place], [place] [address]    Dg Chest Port 1 View  Result Date: 06/09/2019 CLINICAL DATA:  78 year old female with fall and fracture of the patella. Preop evaluation. EXAM: PORTABLE CHEST 1 VIEW COMPARISON:  Chest radiograph dated 05/25/2015 FINDINGS: The lungs are clear. There is no pleural effusion or pneumothorax. The cardiac silhouette is within normal limits. No acute osseous pathology. IMPRESSION: No active disease. Electronically Signed   By: Anner Crete M.D.   On: 06/09/2019 22:43   Dg Knee Complete 4 Views Right  Result Date: 06/09/2019 CLINICAL DATA:  Pain. EXAM: RIGHT KNEE - COMPLETE 4+ VIEW COMPARISON:  January 04, 2012 FINDINGS: The patient is status post total knee arthroplasty on the right. The hardware appears grossly intact. There is a displace/distracted transverse fracture through the patella. There is extensive surrounding soft tissue swelling. There is a small to moderate-sized joint effusion. IMPRESSION: 1. Acute displaced transverse fracture through the patella. 2. Status post total knee arthroplasty on the right  without evidence for a periprosthetic fracture. Electronically Signed   By: Constance Holster M.D.   On: 06/09/2019 21:54    Pending Labs Unresulted Labs (From admission, onward)    Start     Ordered   06/16/19 0500  Creatinine, serum  (enoxaparin (LOVENOX)    CrCl < 30 ml/min)  Weekly,   R    Comments: while on enoxaparin therapy.    06/09/19 2348   06/10/19 0500  CBC  Tomorrow morning,   R     06/09/19 2348   06/10/19 6979  Basic metabolic panel  Tomorrow morning,   R     06/09/19 2348   06/09/19 2348  Hemoglobin A1c  Once,   STAT    Comments: To assess prior glycemic control    06/09/19 2348   06/09/19 2342  CBC  (enoxaparin (LOVENOX)    CrCl < 30 ml/min)  Once,   STAT    Comments: Baseline for enoxaparin therapy IF NOT ALREADY DRAWN.  Notify MD if PLT < 100 K.    06/09/19 2348   06/09/19 2342  Creatinine, serum  (enoxaparin (LOVENOX)    CrCl < 30 ml/min)  Once,   STAT    Comments: Baseline for enoxaparin therapy IF NOT ALREADY DRAWN.    06/09/19 2348          Vitals/Pain Today's Vitals   06/09/19 2200 06/09/19 2300 06/10/19 0030 06/10/19 0108  BP: (!) 156/66 (!) 173/95 135/75 (!) 144/69  Pulse: 95 (!) 104 98 100  Resp: 17 20 (!) 21 15  Temp:    98.3 F (36.8 C)  TempSrc:    Oral  SpO2: 98% 97% 96% 98%  PainSc:    0-No pain    Isolation Precautions No active isolations  Medications Medications  allopurinol (ZYLOPRIM) tablet 100 mg (has no administration in time range)  hydrochlorothiazide (HYDRODIURIL) tablet 25 mg (has no administration in time range)  irbesartan (AVAPRO) tablet 300 mg (has no administration in time range)  diphenhydramine-acetaminophen (TYLENOL PM) 25-500 MG per  tablet 1 tablet (has no administration in time range)  pioglitazone (ACTOS) tablet 15 mg (has no administration in time range)  linagliptin (TRADJENTA) tablet 5 mg (has no administration in time range)  levothyroxine (SYNTHROID) tablet 137 mcg (has no administration in time range)   ferrous sulfate tablet 325 mg (has no administration in time range)  cholecalciferol (VITAMIN D3) tablet 1,000 Units (has no administration in time range)  PreserVision AREDS 2 CAPS (has no administration in time range)  ketoconazole (NIZORAL) 2 % cream 1 application (has no administration in time range)  timolol (TIMOPTIC) 0.5 % ophthalmic solution 1 drop (has no administration in time range)  enoxaparin (LOVENOX) injection 30 mg (has no administration in time range)  0.9 %  sodium chloride infusion (has no administration in time range)  acetaminophen (TYLENOL) tablet 325-650 mg (has no administration in time range)  HYDROcodone-acetaminophen (NORCO/VICODIN) 5-325 MG per tablet 1-2 tablet (has no administration in time range)  HYDROcodone-acetaminophen (NORCO) 7.5-325 MG per tablet 1-2 tablet (has no administration in time range)  morphine 2 MG/ML injection 0.5-1 mg (has no administration in time range)  acetaminophen (TYLENOL) tablet 500 mg (has no administration in time range)  traMADol (ULTRAM) tablet 50 mg (has no administration in time range)  methocarbamol (ROBAXIN) tablet 500 mg (has no administration in time range)    Or  methocarbamol (ROBAXIN) 500 mg in dextrose 5 % 50 mL IVPB (has no administration in time range)  docusate sodium (COLACE) capsule 100 mg (has no administration in time range)  polyethylene glycol (MIRALAX / GLYCOLAX) packet 17 g (has no administration in time range)  bisacodyl (DULCOLAX) suppository 10 mg (has no administration in time range)  sodium phosphate (FLEET) 7-19 GM/118ML enema 1 enema (has no administration in time range)  ondansetron (ZOFRAN) tablet 4 mg (has no administration in time range)    Or  ondansetron (ZOFRAN) injection 4 mg (has no administration in time range)  metoCLOPramide (REGLAN) tablet 5-10 mg (has no administration in time range)    Or  metoCLOPramide (REGLAN) injection 5-10 mg (has no administration in time range)  insulin aspart  (novoLOG) injection 0-15 Units (has no administration in time range)  morphine 2 MG/ML injection 2 mg (2 mg Intravenous Given 06/09/19 2238)    Mobility walks with device

## 2019-06-11 ENCOUNTER — Inpatient Hospital Stay (HOSPITAL_COMMUNITY): Payer: Medicare Other

## 2019-06-11 ENCOUNTER — Encounter (HOSPITAL_COMMUNITY): Payer: Self-pay | Admitting: *Deleted

## 2019-06-11 ENCOUNTER — Inpatient Hospital Stay (HOSPITAL_COMMUNITY): Payer: Medicare Other | Admitting: Anesthesiology

## 2019-06-11 ENCOUNTER — Encounter (HOSPITAL_COMMUNITY)
Admission: EM | Disposition: A | Discharge status: Skilled Nursing Facility | Payer: Self-pay | Source: Home / Self Care | Attending: Specialist

## 2019-06-11 DIAGNOSIS — I5032 Chronic diastolic (congestive) heart failure: Secondary | ICD-10-CM

## 2019-06-11 DIAGNOSIS — I10 Essential (primary) hypertension: Secondary | ICD-10-CM

## 2019-06-11 DIAGNOSIS — S82041A Displaced comminuted fracture of right patella, initial encounter for closed fracture: Secondary | ICD-10-CM

## 2019-06-11 DIAGNOSIS — S82001A Unspecified fracture of right patella, initial encounter for closed fracture: Secondary | ICD-10-CM

## 2019-06-11 HISTORY — PX: ORIF PATELLA: SHX5033

## 2019-06-11 LAB — CBC
HCT: 32.9 % — ABNORMAL LOW (ref 36.0–46.0)
Hemoglobin: 10.3 g/dL — ABNORMAL LOW (ref 12.0–15.0)
MCH: 30.9 pg (ref 26.0–34.0)
MCHC: 31.3 g/dL (ref 30.0–36.0)
MCV: 98.8 fL (ref 80.0–100.0)
Platelets: 213 10*3/uL (ref 150–400)
RBC: 3.33 MIL/uL — ABNORMAL LOW (ref 3.87–5.11)
RDW: 14.9 % (ref 11.5–15.5)
WBC: 6.1 10*3/uL (ref 4.0–10.5)
nRBC: 0 % (ref 0.0–0.2)

## 2019-06-11 LAB — BASIC METABOLIC PANEL
Anion gap: 9 (ref 5–15)
BUN: 28 mg/dL — ABNORMAL HIGH (ref 8–23)
CO2: 21 mmol/L — ABNORMAL LOW (ref 22–32)
Calcium: 9.1 mg/dL (ref 8.9–10.3)
Chloride: 108 mmol/L (ref 98–111)
Creatinine, Ser: 1.3 mg/dL — ABNORMAL HIGH (ref 0.44–1.00)
GFR calc Af Amer: 46 mL/min — ABNORMAL LOW (ref >60)
GFR calc non Af Amer: 39 mL/min — ABNORMAL LOW (ref >60)
Glucose, Bld: 153 mg/dL — ABNORMAL HIGH (ref 70–99)
Potassium: 4.3 mmol/L (ref 3.5–5.1)
Sodium: 138 mmol/L (ref 135–145)

## 2019-06-11 LAB — PROTIME-INR
INR: 1.1 (ref 0.8–1.2)
Prothrombin Time: 13.6 seconds (ref 11.4–15.2)

## 2019-06-11 LAB — GLUCOSE, CAPILLARY
Glucose-Capillary: 160 mg/dL — ABNORMAL HIGH (ref 70–99)
Glucose-Capillary: 188 mg/dL — ABNORMAL HIGH (ref 70–99)
Glucose-Capillary: 180 mg/dL — ABNORMAL HIGH (ref 70–99)
Glucose-Capillary: 210 mg/dL — ABNORMAL HIGH (ref 70–99)
Glucose-Capillary: 216 mg/dL — ABNORMAL HIGH (ref 70–99)

## 2019-06-11 LAB — SURGICAL PCR SCREEN
MRSA, PCR: NEGATIVE
Staphylococcus aureus: POSITIVE — AB

## 2019-06-11 LAB — APTT: aPTT: 33 seconds (ref 24–36)

## 2019-06-11 LAB — VITAMIN D 25 HYDROXY (VIT D DEFICIENCY, FRACTURES): Vit D, 25-Hydroxy: 28.2 ng/mL — ABNORMAL LOW (ref 30.0–100.0)

## 2019-06-11 SURGERY — OPEN REDUCTION INTERNAL FIXATION (ORIF) PATELLA
Anesthesia: Spinal | Laterality: Right

## 2019-06-11 MED ORDER — ACETAMINOPHEN 10 MG/ML IV SOLN
INTRAVENOUS | Status: AC
  Filled 2019-06-11: qty 100

## 2019-06-11 MED ORDER — DEXAMETHASONE SODIUM PHOSPHATE 10 MG/ML IJ SOLN
INTRAMUSCULAR | Status: AC
  Filled 2019-06-11: qty 1

## 2019-06-11 MED ORDER — FENTANYL CITRATE (PF) 100 MCG/2ML IJ SOLN
INTRAMUSCULAR | Status: AC
  Filled 2019-06-11: qty 2

## 2019-06-11 MED ORDER — SODIUM CHLORIDE 0.9 % IV SOLN
INTRAVENOUS | Status: AC
  Filled 2019-06-11: qty 500000

## 2019-06-11 MED ORDER — ESMOLOL HCL 100 MG/10ML IV SOLN
INTRAVENOUS | Status: AC
  Filled 2019-06-11: qty 10

## 2019-06-11 MED ORDER — CEFAZOLIN SODIUM-DEXTROSE 2-4 GM/100ML-% IV SOLN: 2.0000 g | INTRAVENOUS | Status: DC

## 2019-06-11 MED ORDER — LIDOCAINE 2% (20 MG/ML) 5 ML SYRINGE
INTRAMUSCULAR | Status: AC
  Filled 2019-06-11: qty 5

## 2019-06-11 MED ORDER — PROPOFOL 10 MG/ML IV BOLUS
INTRAVENOUS | Status: AC
  Filled 2019-06-11: qty 40

## 2019-06-11 MED ORDER — BUPIVACAINE HCL (PF) 0.5 % IJ SOLN
INTRAMUSCULAR | Status: AC
  Filled 2019-06-11: qty 30

## 2019-06-11 MED ORDER — MIDAZOLAM HCL 2 MG/2ML IJ SOLN
INTRAMUSCULAR | Status: AC
  Filled 2019-06-11: qty 2

## 2019-06-11 MED ORDER — ROPIVACAINE HCL 5 MG/ML IJ SOLN
INTRAMUSCULAR | Status: DC | PRN
  Administered 2019-06-11: 30 mL via PERINEURAL

## 2019-06-11 MED ORDER — FENTANYL CITRATE (PF) 100 MCG/2ML IJ SOLN
INTRAMUSCULAR | Status: AC
  Administered 2019-06-11: 25 ug via INTRAVENOUS
  Filled 2019-06-11: qty 2

## 2019-06-11 MED ORDER — PROPOFOL 10 MG/ML IV BOLUS
INTRAVENOUS | Status: DC | PRN
  Administered 2019-06-11: 10 mg via INTRAVENOUS
  Administered 2019-06-11 (×2): 20 mg via INTRAVENOUS
  Administered 2019-06-11: 120 mg via INTRAVENOUS

## 2019-06-11 MED ORDER — BUPIVACAINE HCL (PF) 0.5 % IJ SOLN
INTRAMUSCULAR | Status: DC | PRN
  Administered 2019-06-11: 15 mL

## 2019-06-11 MED ORDER — KETAMINE HCL 10 MG/ML IJ SOLN
INTRAMUSCULAR | Status: DC | PRN
  Administered 2019-06-11: 20 mg via INTRAVENOUS

## 2019-06-11 MED ORDER — KETAMINE HCL 10 MG/ML IJ SOLN
INTRAMUSCULAR | Status: AC
  Filled 2019-06-11: qty 1

## 2019-06-11 MED ORDER — ESMOLOL HCL 100 MG/10ML IV SOLN
INTRAVENOUS | Status: DC | PRN
  Administered 2019-06-11: 10 mg via INTRAVENOUS

## 2019-06-11 MED ORDER — FENTANYL CITRATE (PF) 100 MCG/2ML IJ SOLN
INTRAMUSCULAR | Status: DC | PRN
  Administered 2019-06-11 (×4): 25 ug via INTRAVENOUS
  Administered 2019-06-11: 50 ug via INTRAVENOUS
  Administered 2019-06-11 (×2): 25 ug via INTRAVENOUS

## 2019-06-11 MED ORDER — LACTATED RINGERS IV SOLN
INTRAVENOUS | Status: DC
  Administered 2019-06-11 (×2): via INTRAVENOUS

## 2019-06-11 MED ORDER — BUPIVACAINE LIPOSOME 1.3 % IJ SUSP
INTRAMUSCULAR | Status: DC | PRN
  Administered 2019-06-11: 15 mL

## 2019-06-11 MED ORDER — FENTANYL CITRATE (PF) 100 MCG/2ML IJ SOLN
50.0000 ug | Freq: Once | INTRAMUSCULAR | Status: AC
  Administered 2019-06-11: 25 ug via INTRAVENOUS

## 2019-06-11 MED ORDER — LIDOCAINE 2% (20 MG/ML) 5 ML SYRINGE
INTRAMUSCULAR | Status: DC | PRN
  Administered 2019-06-11: 100 mg via INTRAVENOUS

## 2019-06-11 MED ORDER — FENTANYL CITRATE (PF) 100 MCG/2ML IJ SOLN: 25.0000 ug | INTRAMUSCULAR | Status: DC | PRN

## 2019-06-11 MED ORDER — ONDANSETRON HCL 4 MG/2ML IJ SOLN
INTRAMUSCULAR | Status: DC | PRN
  Administered 2019-06-11: 4 mg via INTRAVENOUS

## 2019-06-11 MED ORDER — ONDANSETRON HCL 4 MG/2ML IJ SOLN
INTRAMUSCULAR | Status: AC
  Filled 2019-06-11: qty 2

## 2019-06-11 MED ORDER — SODIUM CHLORIDE 0.9 % IV SOLN
INTRAVENOUS | Status: DC | PRN
  Administered 2019-06-11: 500 mL

## 2019-06-11 MED ORDER — ASPIRIN 325 MG PO TABS
325.0000 mg | ORAL_TABLET | Freq: Every day | ORAL | Status: DC
  Administered 2019-06-11 – 2019-06-14 (×4): 325 mg via ORAL
  Filled 2019-06-11 (×4): qty 1

## 2019-06-11 MED ORDER — ONDANSETRON HCL 4 MG/2ML IJ SOLN: 4.0000 mg | Freq: Once | INTRAMUSCULAR | Status: DC | PRN

## 2019-06-11 MED ORDER — MIDAZOLAM HCL 2 MG/2ML IJ SOLN: 1.0000 mg | Freq: Once | INTRAMUSCULAR | Status: DC

## 2019-06-11 MED ORDER — ACETAMINOPHEN 10 MG/ML IV SOLN
1000.0000 mg | Freq: Once | INTRAVENOUS | Status: DC | PRN
  Administered 2019-06-11: 1000 mg via INTRAVENOUS

## 2019-06-11 MED ORDER — PHENYLEPHRINE 40 MCG/ML (10ML) SYRINGE FOR IV PUSH (FOR BLOOD PRESSURE SUPPORT)
PREFILLED_SYRINGE | INTRAVENOUS | Status: DC | PRN
  Administered 2019-06-11: 80 ug via INTRAVENOUS

## 2019-06-11 MED ORDER — DEXAMETHASONE SODIUM PHOSPHATE 10 MG/ML IJ SOLN
INTRAMUSCULAR | Status: DC | PRN
  Administered 2019-06-11: 5 mg via INTRAVENOUS

## 2019-06-11 MED ORDER — PROPOFOL 10 MG/ML IV BOLUS
INTRAVENOUS | Status: AC
  Filled 2019-06-11: qty 20

## 2019-06-11 MED ORDER — ENSURE PRE-SURGERY PO LIQD
296.0000 mL | Freq: Once | ORAL | Status: AC
  Administered 2019-06-11: 237 mL via ORAL
  Filled 2019-06-11: qty 296

## 2019-06-11 SURGICAL SUPPLY — 55 items
ANCHOR SUT KEITH ABD SZ2 STR (SUTURE) ×3 IMPLANT
BAG ZIPLOCK 12X15 (MISCELLANEOUS) ×3 IMPLANT
BANDAGE ESMARK 6X9 LF (GAUZE/BANDAGES/DRESSINGS) ×1 IMPLANT
BNDG COHESIVE 6X5 TAN STRL LF (GAUZE/BANDAGES/DRESSINGS) ×3 IMPLANT
BNDG ELASTIC 4X5.8 VLCR STR LF (GAUZE/BANDAGES/DRESSINGS) ×3 IMPLANT
BNDG ELASTIC 6X10 VLCR STRL LF (GAUZE/BANDAGES/DRESSINGS) ×3 IMPLANT
BNDG ESMARK 6X9 LF (GAUZE/BANDAGES/DRESSINGS) ×3
BNDG GAUZE ELAST 4 BULKY (GAUZE/BANDAGES/DRESSINGS) ×3 IMPLANT
CLEANER TIP ELECTROSURG 2X2 (MISCELLANEOUS) ×3 IMPLANT
COVER SURGICAL LIGHT HANDLE (MISCELLANEOUS) ×3 IMPLANT
COVER WAND RF STERILE (DRAPES) IMPLANT
CUFF TOURN SGL QUICK 42 (TOURNIQUET CUFF) ×3 IMPLANT
DECANTER SPIKE VIAL GLASS SM (MISCELLANEOUS) ×3 IMPLANT
DRAPE INCISE IOBAN 85X60 (DRAPES) ×3 IMPLANT
DRAPE ORTHO SPLIT 77X108 STRL (DRAPES) ×2
DRAPE SURG ORHT 6 SPLT 77X108 (DRAPES) ×1 IMPLANT
DRAPE U-SHAPE 47X51 STRL (DRAPES) ×3 IMPLANT
DRSG EMULSION OIL 3X16 NADH (GAUZE/BANDAGES/DRESSINGS) ×3 IMPLANT
DRSG PAD ABDOMINAL 8X10 ST (GAUZE/BANDAGES/DRESSINGS) ×3 IMPLANT
DURAPREP 26ML APPLICATOR (WOUND CARE) ×6 IMPLANT
ELECT REM PT RETURN 15FT ADLT (MISCELLANEOUS) ×3 IMPLANT
GAUZE SPONGE 4X4 12PLY STRL (GAUZE/BANDAGES/DRESSINGS) ×3 IMPLANT
GLOVE BIO SURGEON STRL SZ7.5 (GLOVE) ×3 IMPLANT
GLOVE BIOGEL PI IND STRL 7.5 (GLOVE) ×1 IMPLANT
GLOVE BIOGEL PI IND STRL 8 (GLOVE) ×1 IMPLANT
GLOVE BIOGEL PI IND STRL 9 (GLOVE) ×1 IMPLANT
GLOVE BIOGEL PI INDICATOR 7.5 (GLOVE) ×2
GLOVE BIOGEL PI INDICATOR 8 (GLOVE) ×2
GLOVE BIOGEL PI INDICATOR 9 (GLOVE) ×2
GLOVE ECLIPSE 8.5 STRL (GLOVE) ×6 IMPLANT
GLOVE ORTHO TXT STRL SZ7.5 (GLOVE) ×3 IMPLANT
GLOVE SURG SS PI 7.0 STRL IVOR (GLOVE) ×6 IMPLANT
GOWN STRL REUS W/TWL XL LVL3 (GOWN DISPOSABLE) ×9 IMPLANT
KIT TURNOVER KIT A (KITS) IMPLANT
MANIFOLD NEPTUNE II (INSTRUMENTS) ×3 IMPLANT
NDL SAFETY ECLIPSE 18X1.5 (NEEDLE) ×1 IMPLANT
NEEDLE HYPO 18GX1.5 SHARP (NEEDLE) ×2
PACK ORTHO EXTREMITY (CUSTOM PROCEDURE TRAY) ×3 IMPLANT
PADDING CAST COTTON 6X4 STRL (CAST SUPPLIES) ×3 IMPLANT
PASSER SUT SWANSON 36MM LOOP (INSTRUMENTS) ×3 IMPLANT
PROTECTOR NERVE ULNAR (MISCELLANEOUS) ×3 IMPLANT
SPONGE LAP 18X18 RF (DISPOSABLE) ×6 IMPLANT
SPONGE LAP 4X18 RFD (DISPOSABLE) ×6 IMPLANT
STAPLER VISISTAT 35W (STAPLE) ×3 IMPLANT
SUT ETHIBOND NAB CT1 #1 30IN (SUTURE) ×12 IMPLANT
SUT FIBERWIRE #2 38 REV NDL BL (SUTURE) ×6
SUT VIC AB 0 CT1 27 (SUTURE) ×6
SUT VIC AB 0 CT1 27XBRD ANTBC (SUTURE) ×3 IMPLANT
SUT VIC AB 1 CT1 27 (SUTURE)
SUT VIC AB 1 CT1 27XBRD ANTBC (SUTURE) IMPLANT
SUT VIC AB 2-0 SH 27 (SUTURE) ×4
SUT VIC AB 2-0 SH 27X BRD (SUTURE) ×2 IMPLANT
SUTURE FIBERWR#2 38 REV NDL BL (SUTURE) ×2 IMPLANT
SYR 30ML LL (SYRINGE) ×3 IMPLANT
TOWEL OR 17X26 10 PK STRL BLUE (TOWEL DISPOSABLE) ×3 IMPLANT

## 2019-06-11 NOTE — Progress Notes (Signed)
Assisted Dr. Valma Cava with right, ultrasound guided, adductor canal block. Christell Faith, CRNA at the bedside to chart vitals. Pt given 25 mcg Fentanyl. Side rails up, monitors on throughout procedure. See vital signs in flow sheet. Tolerated Procedure well.

## 2019-06-11 NOTE — Anesthesia Procedure Notes (Signed)
Spinal  Patient location during procedure: OR Start time: 06/11/2019 12:45 PM End time: 06/11/2019 12:53 PM Staffing Anesthesiologist: Barnet Glasgow, MD Preanesthetic Checklist Completed: patient identified, site marked, surgical consent, pre-op evaluation, timeout performed, IV checked, risks and benefits discussed and monitors and equipment checked Spinal Block Patient position: sitting Prep: DuraPrep Patient monitoring: heart rate, cardiac monitor, continuous pulse ox and blood pressure Approach: midline Location: L3-4 Injection technique: single-shot Needle Needle type: Sprotte  Needle gauge: 24 G Assessment Sensory level: T4 Additional Notes 2 Attempts at spinal abandoned due to patient positioning convert to [place]

## 2019-06-11 NOTE — Op Note (Signed)
06/11/2019  3:46 PM  PATIENT:  Autumn Chambers  78 y.o. female  MRN: [id number]  OPERATIVE REPORT  PRE-OPERATIVE DIAGNOSIS:  right transverse patella frature, right total knee replacement  POST-OPERATIVE DIAGNOSIS:  right transverse comminuted patella frature, right total knee replacement  PROCEDURE:  Procedure(s): OPEN REDUCTION INTERNAL (ORIF) FIXATION RIGHT PATELLA FRACTURE WITH multiple sutures through drill holes reattaching the inferior pole and proximal patella ligement to the proximal patella fragment, CERCLAGE FIBERWIRE PATELLA TO TIBIA    SURGEON:  Jessy Oto, MD     ASSISTANT:  Benjiman Core, PA-C  (Present throughout the entire procedure and necessary for completion of procedure in a timely manner)     ANESTHESIA:  General, attempted spinal, Dr. Valma Cava, local infiltration with marcaine 0.5% 1:1 exparel 1.3% total 30CC.    TOTAL TOURNIQUET TIME: 78 minutes at 166 mm HG.  COMPLICATIONS:  None.     COMPONENTS: The inferior pole patella fragment with proximal patella ligament attached reapproximated to the proximal fragment with sutures through drill holes, a circumferential patella cerclage #2 fiberwire and a #2 cerclage patella to anterior tibial tuberosity.   PROCEDURE:The patient was met in the holding area, and the appropriate right knee identified and marked with "X" and my initials. The patient received a preoperative adductor nerve block by anesthesia.  The patient was then transported to OR and was placed on the operative table in a supine position. At first attempts were made to perform spinal anesthesia and this was unsuccessful. The patient was then placed under general anesthesia without difficulty. The patient received appropriate preoperative antibiotic prophylaxis IV ancef 2 gm.. The patien'ts right knee was examined under anesthesia and shown to have ecchymosis of the anterior right knee and along the anterior right tibia with knee  effusion/hemarthrosis.  Tourniquet was applied to the operative thigh. Leg was then prepped using sterile conditions and draped using sterile technique including the right foot to the upper thigh.  Time-out procedure was called and correct knee identified.  The leg was elevated and Esmarch exsanguinated with a thigh tourniquet elevated ot 325 mmHg with the knee flexed.  Initially, through a 15-cm longitudinal incision based over the patella initial exposure was made. The underlying subcutaneous tissues were incised along with the skin incision. The peritenon of the anterior aspect of the knee incised in line with the median skin incision then continued towards the quadriceps proximally and over the mid and distal patella ligament. This was developed both medial and lateral to allow for closure at the end of the case. The fracture site then identified using sharp dissection with 15 blade scalpel. The 2 major patella fragments identified as well as a large proximal fragment with some comminution of the medial fracture line and lateral superior fracture line. Incision made into the retinaculum of the knee both medially and laterally to allow for examination of the patella following reduction. The fracture site debrided of loose organizing hematoma and loose fragments of bone. The knee joint was then irrigated with large amounts of irrigant solution ensuring no fragments of bone or cartilage within the joint. The distal fragment were then reduced to each other with reducing tenaculum. The reduced surfaces marked for alignment with reduction and for placement of drill holes for suture through drill hole repair. A 15m Steinman pin used to drill 2 hole longitudinally from near the joint surface of the distal fragment exiting distally. #1 ethibond sutures then passed with a suture passer with the medial and  lateral sutures passed along the medial and lateral parts of the distal patella fragment, the central suture  with both arms through the drilled opening and one each arm of the other sutures. 2 drill holes placed in the proximal pole fragment after aligning the fragments and the sutures again passed proximally using a knot in the central suture arms to allow identification of this suture.  A lateral and medial fragment was then sutured to the proximal  fragment using a #1 Ethibond suture passing the inner arms through pin drill holes. With the tying of the ethibond sutures good initial fixation was obtained. A #2 fiberwire was then passed circumferentially about the patella to cerclage the patella and provide extra fixation of the fragments. C-arm fluoroscopy was used to ascertain the reduction prior to fixation and also used to evaluate the reduction following the cerclage banding with a #2 fiberwire. Irrigation was further carried and a #2 tape cerclage suture was passed about the patella proximal pole through the distal quadriceps tendon crossing across the medial and lateral aspects of the patella then transverse through drill holes deep to the anterior tibial tubercle and patella ligament attachment and then suture tied. This obtained cerclage banding of the the patella to the tibia to off load stress on the repair site. Irrigation was carried out. The prominent inset of the fiber tape were sewn to the deeper tissues of the knee retinaculum to decrease the prominence. The rent in the medial and lateral aspect of the retinaculum of the knee approximated with interrupted 0 Ethibond sutures. Inspection was made of the joint line prior to closure of the retinaculum demonstrating the fracture reduced to within 1-2 mm. Permanent images were then obtained in AP and lateral planes using the C-arm.  Peritenon was reapproximated in the midline with interrupted 0 Vicryl sutures. The subcutaneous layers reapproximated with interrupted 2-0 Vicryl sutures and the skin closed with a stainless steel staples. Adaptic 4 x 4's ABDs pads  fixed to the skin with sterile labrum an Ace wrap applied from the foot to the right upper thigh and the knee immobilizer then applied. All instrument and sponge counts were correct. The patient then reactivated extubated and returned to recovery room in satisfactory condition.         [name]  06/11/2019, 3:46 PM

## 2019-06-11 NOTE — Anesthesia Procedure Notes (Addendum)
Procedure Name: LMA Insertion Date/Time: 06/11/2019 1:03 PM Performed by: West Pugh, CRNA Pre-anesthesia Checklist: Patient identified, Emergency Drugs available, Suction available, Patient being monitored and Timeout performed Patient Re-evaluated:Patient Re-evaluated prior to induction Oxygen Delivery Method: Circle system utilized Preoxygenation: Pre-oxygenation with 100% oxygen Induction Type: IV induction LMA: LMA with gastric port inserted LMA Size: 5.0 Number of attempts: 1 Placement Confirmation: positive ETCO2 Tube secured with: Tape Dental Injury: Teeth and Oropharynx as per pre-operative assessment

## 2019-06-11 NOTE — Anesthesia Procedure Notes (Signed)
Anesthesia Regional Block: Adductor canal block   Pre-Anesthetic Checklist: ,, timeout performed, Correct Patient, Correct Site, Correct Laterality, Correct Procedure, Correct Position, site marked, Risks and benefits discussed,  Surgical consent,  Pre-op evaluation,  At surgeon's request and post-op pain management  Laterality: Lower and Right  Prep: chloraprep       Needles:  Injection technique: Single-shot  Needle Type: Echogenic Needle     Needle Length: 9cm  Needle Gauge: 22     Additional Needles:   Procedures:,,,, ultrasound used (permanent image in chart),,,,  Narrative:  Start time: 06/11/2019 12:30 PM End time: 06/11/2019 12:40 PM Injection made incrementally with aspirations every 5 mL.  Performed by: Personally  Anesthesiologist: Barnet Glasgow, MD  Additional Notes: Block assessed prior to surgery. Pt tolerated procedure well.

## 2019-06-11 NOTE — Anesthesia Procedure Notes (Deleted)
Anesthesia Procedure Note     

## 2019-06-11 NOTE — Transfer of Care (Signed)
Immediate Anesthesia Transfer of Care Note  Patient: AILYNN GOW  Procedure(s) Performed: OPEN REDUCTION INTERNAL (ORIF) FIXATION RIGHT PATELLA FRACTURE WITH PINS, WIRE, CANNULATED SCREWS, CERCLAGE WIRE PATELLA TO TIBIA (Right )  Patient Location: PACU  Anesthesia Type:GA combined with regional for post-op pain  Level of Consciousness: awake, alert , oriented and patient cooperative  Airway & Oxygen Therapy: Patient Spontanous Breathing and Patient connected to face mask oxygen  Post-op Assessment: Report given to RN and Post -op Vital signs reviewed and stable  Post vital signs: Reviewed and stable  Last Vitals:  Vitals Value Taken Time  BP 142/64 06/11/19 1525  Temp    Pulse 106 06/11/19 1528  Resp 16 06/11/19 1530  SpO2 98 % 06/11/19 1528  Vitals shown include unvalidated device data.  Last Pain:  Vitals:   06/11/19 0830  TempSrc:   PainSc: 4       Patients Stated Pain Goal: 4 (22/44/97 5300)  Complications: No apparent anesthesia complications

## 2019-06-11 NOTE — Discharge Instructions (Addendum)
Keep leg splint and dressing dry. May use water impervious bag or cast bag and tub chair to shower Tape the top of bag to skin to avoid moisture soaking the dressing on the leg. Call if there is odor or saturation of dressing or worsening pain not controlled with medications. Call if fever greater than 101.5. Use crutches or walker no weight bearing on the ankle fracture leg. Please follow up with an appointment with Dr. Louanne Skye  2 weeks from the time of surgery. Elevate as often as possible during the first week after surgery gradually increasing the time the leg is dependent or down there after. If swelling recurrs then elevate again. Wheel chair for longer distances. Take anticoagulant daily at 6 PM.   Patellar Fracture, Adult  A patellar fracture is a break in the kneecap (patella). The patella is located on the front of the knee. A patellar fracture may make it difficult to walk. What are the causes? This condition may be caused by:  A fall or a hard, direct hit (blow) to the knee.  A very hard and strong bending of the knee. What increases the risk? The following factors make you more likely to experience a patellar fracture:  Playing contact sports or motor sports, especially sports that involve a lot of jumping.  Having bone abnormalities or diseases of the bone, such as osteoporosis or a bone tumor.  Having poor strength and flexibility.  Having metabolism disorders, hormone problems, or nutrition deficiencies and disorders, such as anorexia or bulimia. What are the signs or symptoms? Symptoms of this condition include:  Tender and swollen knee.  Pain when moving the knee, especially when straightening it.  Difficulty walking or using the knee to support body weight (bearing weight).  Misshapen knee, as if a bone is out of place. How is this diagnosed? This condition is diagnosed based on:  Your symptoms and medical history.  A physical  exam.  X-rays. How is this treated? Treatment for this condition depends on the type of fracture that you have:  If your patella is still in the right position after the fracture and you can still straighten your leg, you may need to wear a splint or cast for 4-6 weeks.  If your patella is broken into multiple pieces but you are able to straighten your leg, you can usually be treated with a splint or cast for 4-6 weeks. In some cases, the patella may need to be removed before the cast is applied.  If you cannot straighten your leg after a patellar fracture, you will need to have surgery to hold the patella together until it heals. After surgery, a splint or cast will be applied for 4-6 weeks.  You may be prescribed medicine to help relieve pain or prevent infection. Follow these instructions at home: If you have a splint:  Wear the splint as told by your health care provider. Remove it only as told by your health care provider.  Loosen the splint if your toes tingle, become numb, or turn cold and blue.  Keep the splint clean.  If the splint is not waterproof: ? Do not let it get wet. ? Cover it with a watertight covering when you take a bath or a shower. If you have a cast:  Do not stick anything inside the cast to scratch your skin. Doing that increases your risk of infection.  Check the skin around the cast every day. Tell your health care provider about  any concerns.  You may put lotion on dry skin around the edges of the cast. Do not put lotion on the skin underneath the cast.  Keep the cast clean.  If the cast is not waterproof: ? Do not let it get wet. ? Cover it with a watertight covering when you take a bath or a shower. Managing pain, stiffness, and swelling   If directed, put ice on the injured area: ? If you have a removable splint, remove it as told by your health care provider. ? Put ice in a plastic bag. ? Place a towel between your skin and the bag or  between your cast and the bag. ? Leave the ice on for 20 minutes, 2-3 times a day.  Move your toes often to avoid stiffness and to lessen swelling.  Raise (elevate) the injured area above the level of your heart while you are sitting or lying down. Medicines  Take over-the-counter and prescription medicines only as told by your health care provider.  If you were prescribed an antibiotic medicine, use it as told by your health care provider. Do not stop taking the antibiotic even if you start to feel better. Activity  Return to your normal activities as told by your health care provider. Ask your health care provider what activities are safe for you.  Do exercises as told by your health care provider.  Ask your health care provider when it is safe to drive if you have a splint or cast on your leg.  Do not drive or use heavy machinery while taking prescription pain medicine. General instructions  Do not use the injured limb to support your body weight until your health care provider says that you can. Use crutches as told by your health care provider.  Do not use any products that contain nicotine or tobacco, such as cigarettes and e-cigarettes. These can delay bone healing. If you need help quitting, ask your health care provider.  Keep all follow-up visits as told by your health care provider. This is important. Contact a health care provider if:  You have symptoms that get worse or do not get better after 2 weeks of treatment.  You have severe, persistent pain. Get help right away if:  You have redness, swelling, or increasing pain in your knee.  You have a fever.  You have blue or gray skin below the fracture site or in the toes.  You have numbness or loss of feeling below the fracture site. This information is not intended to replace advice given to you by your health care provider. Make sure you discuss any questions you have with your health care provider. Document  Released: 07/16/2003 Document Revised: 09/29/2017 Document Reviewed: 07/29/2016 Elsevier Patient Education  2020 [employer].

## 2019-06-11 NOTE — Progress Notes (Signed)
PROGRESS NOTE  Autumn Chambers ZES:[id number] DOB: 1941/06/03 DOA: 06/09/2019 PCP: Kathyrn Lass, MD  HPI/Recap of past 66 hours: 78 year old female with past medical history of diabetes mellitus and hypertension admitted by orthopedic surgery for displaced right patella fracture.  Hospitalist consulted for comanagement.  Patient overall stable.  Noted to have mild holosystolic murmur, echocardiogram ordered which revealed incidental finding of diastolic dysfunction, otherwise okay for surgery.  Patient with no complaints.  Mild pain whenever she tries to move, but overall is doing okay.  Assessment/Plan: Principal Problem:   Dyspnea Active Problems:   Closed displaced fracture of right patella: Okay for surgery. Chronic diastolic heart failure: Incidentally noted on echocardiogram.  Normal BNP.  Appears euvolemic Morbid obesity: Patient meets criteria with BMI greater than 40 Essential hypertension: Continue home medications after surgery Cardiac murmur: Stable, okay for surgery Diabetes mellitus: Continue Januvia, sliding scale insulin  Code Status: Full code  Family Communication: Friend at the bedside  Disposition Plan: Skilled nursing versus home health, to be determined   Consultants:  Hospitalists  Procedures:  For scheduled ORIF of displaced right patella  Antimicrobials:  Preop Ancef  DVT prophylaxis: SCDs   Objective: Vitals:   06/11/19 1150 06/11/19 1525  BP: (!) 187/71 (!) 142/64  Pulse: (!) 103 97  Resp: 16 19  Temp:    SpO2: 98% 100%    Intake/Output Summary (Last 24 hours) at 06/11/2019 1540 Last data filed at 06/11/2019 1530 Gross per 24 hour  Intake 3039.22 ml  Output 1425 ml  Net 1614.22 ml   Filed Weights   06/10/19 0203  Weight: 119.2 kg   Body mass index is 42.42 kg/m.  Exam:   General: Alert and oriented x3, no acute distress  HEENT: Normocephalic and atraumatic, mucous membranes are moist  Neck: Thick, narrow airway   Cardiovascular: Regular rate and rhythm, 2/6 holosystolic murmur  Respiratory: Clear to auscultation bilaterally  Abdomen: Soft, nontender, nondistended, positive bowel sounds  Musculoskeletal: No clubbing or cyanosis or edema   Data Reviewed: CBC: Recent Labs  Lab 06/09/19 2153 06/10/19 0246 06/11/19 0251  WBC 12.0* 9.9 6.1  HGB 11.2* 10.6* 10.3*  HCT 35.3* 33.6* 32.9*  MCV 97.5 97.4 98.8  PLT 253 253 263   Basic Metabolic Panel: Recent Labs  Lab 06/09/19 2153 06/10/19 0246 06/11/19 0251  NA 137 136 138  K 4.7 4.4 4.3  CL 108 106 108  CO2 19* 20* 21*  GLUCOSE 211* 175* 153*  BUN 29* 27* 28*  CREATININE 1.25* 1.25* 1.30*  CALCIUM 9.6 9.1 9.1   GFR: Estimated Creatinine Clearance: 46.9 mL/min (A) (by C-G formula based on SCr of 1.3 mg/dL (H)). Liver Function Tests: Recent Labs  Lab 06/09/19 2153  AST 18  ALT 18  ALKPHOS 74  BILITOT 0.6  PROT 7.6  ALBUMIN 3.7   No results for input(s): LIPASE, AMYLASE in the last 168 hours. No results for input(s): AMMONIA in the last 168 hours. Coagulation Profile: Recent Labs  Lab 06/11/19 0251  INR 1.1   Cardiac Enzymes: No results for input(s): CKTOTAL, CKMB, CKMBINDEX, TROPONINI in the last 168 hours. BNP (last 3 results) No results for input(s): PROBNP in the last 8760 hours. HbA1C: Recent Labs    06/10/19 0246  HGBA1C 7.4*   CBG: Recent Labs  Lab 06/10/19 1609 06/10/19 2143 06/11/19 0732 06/11/19 1109 06/11/19 1148  GLUCAP 143* 141* 160* 188* 180*   Lipid Profile: No results for input(s): CHOL, HDL, LDLCALC, TRIG, CHOLHDL, LDLDIRECT in  the last 72 hours. Thyroid Function Tests: Recent Labs    06/10/19 1039  TSH 0.719   Anemia Panel: No results for input(s): VITAMINB12, FOLATE, FERRITIN, TIBC, IRON, RETICCTPCT in the last 72 hours. Urine analysis:    Component Value Date/Time   COLORURINE YELLOW 05/26/2008 1453   APPEARANCEUR CLOUDY (A) 05/26/2008 1453   LABSPEC 1.017 05/26/2008 1453    PHURINE 6.0 05/26/2008 1453   GLUCOSEU NEGATIVE 05/26/2008 1453   HGBUR NEGATIVE 05/26/2008 1453   BILIRUBINUR NEGATIVE 05/26/2008 1453   KETONESUR NEGATIVE 05/26/2008 1453   PROTEINUR NEGATIVE 05/26/2008 1453   UROBILINOGEN 0.2 05/26/2008 1453   NITRITE NEGATIVE 05/26/2008 1453   LEUKOCYTESUR LARGE (A) 05/26/2008 1453   Sepsis Labs: @LABRCNTIP (procalcitonin:4,lacticidven:4)  ) Recent Results (from the past 240 hour(s))  SARS Coronavirus 2 [facility] order, Performed in [facility] hospital lab) Nasopharyngeal Nasopharyngeal Swab     Status: None   Collection Time: 06/09/19  9:55 PM   Specimen: Nasopharyngeal Swab  Result Value Ref Range Status   SARS Coronavirus 2 NEGATIVE NEGATIVE Final    Comment: (NOTE) If result is NEGATIVE SARS-CoV-2 target nucleic acids are NOT DETECTED. The SARS-CoV-2 RNA is generally detectable in upper and lower  respiratory specimens during the acute phase of infection. The lowest  concentration of SARS-CoV-2 viral copies this assay can detect is 250  copies / mL. A negative result does not preclude SARS-CoV-2 infection  and should not be used as the sole basis for treatment or other  patient management decisions.  A negative result may occur with  improper specimen collection / handling, submission of specimen other  than nasopharyngeal swab, presence of viral mutation(s) within the  areas targeted by this assay, and inadequate number of viral copies  (<250 copies / mL). A negative result must be combined with clinical  observations, patient history, and epidemiological information. If result is POSITIVE SARS-CoV-2 target nucleic acids are DETECTED. The SARS-CoV-2 RNA is generally detectable in upper and lower  respiratory specimens dur ing the acute phase of infection.  Positive  results are indicative of active infection with SARS-CoV-2.  Clinical  correlation with patient history and other diagnostic information is  necessary to determine  patient infection status.  Positive results do  not rule out bacterial infection or co-infection with other viruses. If result is PRESUMPTIVE POSTIVE SARS-CoV-2 nucleic acids MAY BE PRESENT.   A presumptive positive result was obtained on the submitted specimen  and confirmed on repeat testing.  While 2019 novel coronavirus  (SARS-CoV-2) nucleic acids may be present in the submitted sample  additional confirmatory testing may be necessary for epidemiological  and / or clinical management purposes  to differentiate between  SARS-CoV-2 and other Sarbecovirus currently known to infect humans.  If clinically indicated additional testing with an alternate test  methodology [phone]) is advised. The SARS-CoV-2 RNA is generally  detectable in upper and lower respiratory sp ecimens during the acute  phase of infection. The expected result is Negative. Fact Sheet for Patients:  StrictlyIdeas.no Fact Sheet for Healthcare Providers: BankingDealers.co.za This test is not yet approved or cleared by the [place] FDA and has been authorized for detection and/or diagnosis of SARS-CoV-2 by FDA under an Emergency Use Authorization (EUA).  This EUA will remain in effect (meaning this test can be used) for the duration of the COVID-19 declaration under Section 564(b)(1) of the Act, 21 U.S.C. section 360bbb-3(b)(1), unless the authorization is terminated or revoked sooner. Performed at [facility], [place]  [address] [place], [place] [address]   Surgical pcr screen     Status: Abnormal   Collection Time: 06/10/19 10:32 PM   Specimen: Nasal Mucosa; Nasal Swab  Result Value Ref Range Status   MRSA, PCR NEGATIVE NEGATIVE Final   Staphylococcus aureus POSITIVE (A) NEGATIVE Final    Comment: (NOTE) The Xpert SA Assay (FDA approved for NASAL specimens in patients 29 years of age and older), is one component of a comprehensive surveillance  program. It is not intended to diagnose infection nor to guide or monitor treatment. Performed at [facility], [place] [address] [place], [place] [address]       Studies: Dg C-arm 1-60 Min-no Report  Result Date: 06/11/2019 Fluoroscopy was utilized by the requesting physician.  No radiographic interpretation.    Scheduled Meds: . [MAR Hold] allopurinol  100 mg Oral QPC breakfast  . bupivacaine liposome  20 mL Other Once  . [MAR Hold] cholecalciferol  1,000 Units Oral Daily  . [MAR Hold] docusate sodium  100 mg Oral BID  . [MAR Hold] ferrous sulfate  325 mg Oral Q breakfast  . [MAR Hold] insulin aspart  0-15 Units Subcutaneous TID WC  . [MAR Hold] levothyroxine  137 mcg Oral QAC breakfast  . midazolam  1-2 mg Intravenous Once  . [MAR Hold] multivitamin  1 tablet Oral BID  . [MAR Hold] timolol  1 drop Both Eyes BID  . [MAR Hold] traMADol  50 mg Oral Q6H    Continuous Infusions: . sodium chloride 50 mL/hr at 06/11/19 0600  . sodium chloride 75 mL/hr at 06/11/19 0003  . lactated ringers    . [MAR Hold] methocarbamol (ROBAXIN) IV       LOS: 2 days     Annita Brod, MD [facility]  To reach me or the doctor on call, go to: [email or web address] Password [facility]  06/11/2019, 3:40 PM

## 2019-06-11 NOTE — Anesthesia Preprocedure Evaluation (Addendum)
Anesthesia Evaluation  Patient identified by MRN, date of birth, ID band Patient awake    Reviewed: Allergy & Precautions, H&P , NPO status , Patient's Chart, lab work & pertinent test results  History of Anesthesia Complications (+) PONV  Airway Mallampati: II  TM Distance: >3 FB Neck ROM: Full    Dental no notable dental hx. (+) Teeth Intact   Pulmonary neg pulmonary ROS, sleep apnea , former smoker,    Pulmonary exam normal breath sounds clear to auscultation       Cardiovascular hypertension, Pt. on medications negative cardio ROS Normal cardiovascular exam Rhythm:Regular Rate:Normal  06/10/19 Echo  1. The left ventricle has normal systolic function with an ejection fraction of 60-65%. The cavity size was normal. There is mildly increased left ventricular wall thickness. Left ventricular diastolic Doppler parameters are consistent with impaired  relaxation.   Neuro/Psych    GI/Hepatic Neg liver ROS, GERD  ,  Endo/Other  diabetesHypothyroidism   Renal/GU      Musculoskeletal   Abdominal (+) + obese,   Peds  Hematology  (+) Blood dyscrasia, anemia ,   Anesthesia Other Findings extremeely [facility]  Reproductive/Obstetrics                            Lab Results  Component Value Date   CREATININE 1.30 (H) 06/11/2019   BUN 28 (H) 06/11/2019   NA 138 06/11/2019   K 4.3 06/11/2019   CL 108 06/11/2019   CO2 21 (L) 06/11/2019    Lab Results  Component Value Date   WBC 6.1 06/11/2019   HGB 10.3 (L) 06/11/2019   HCT 32.9 (L) 06/11/2019   MCV 98.8 06/11/2019   PLT 213 06/11/2019    Anesthesia Physical Anesthesia Plan  ASA: III  Anesthesia Plan: General   Post-op Pain Management: GA combined w/ Regional for post-op pain   Induction: Intravenous  PONV Risk Score and Plan: Treatment may vary due to age or medical condition, Ondansetron and Dexamethasone  Airway Management Planned:  LMA  Additional Equipment:   Intra-op Plan:   Post-operative Plan:   Informed Consent: I have reviewed the patients History and Physical, chart, labs and discussed the procedure including the risks, benefits and alternatives for the proposed anesthesia with the patient or authorized representative who has indicated his/her understanding and acceptance.     Dental advisory given  Plan Discussed with: CRNA  Anesthesia Plan Comments:        Anesthesia Quick Evaluation

## 2019-06-11 NOTE — Brief Op Note (Addendum)
06/09/2019 - 06/11/2019  3:11 PM  PATIENT:  Autumn Chambers  78 y.o. female  PRE-OPERATIVE DIAGNOSIS:  right transverse patella frature, right total knee replacement  POST-OPERATIVE DIAGNOSIS:  right transverse patella frature, right total knee replacement  PROCEDURE:  Procedure(s): OPEN REDUCTION INTERNAL (ORIF) FIXATION RIGHT PATELLA FRACTURE WITH suture inferior pole and proximal patella ligament to the proximal fragment with 3x #1 Ethibond sutures through drill holes. #2 Fiberwire cerclage and #2 fiberwire patella to tibia cerclage.   SURGEON:  Surgeon(s) and Role:     Jessy Oto, MD - Primary  PHYSICIAN ASSISTANT: Benjiman Core PA-C  ANESTHESIA:   local and general, infiltrated with exparel 1.3% 1:1 marcaine 0.5% total 30cc, adductor block, Dr. Valma Cava  EBL:  150 mL   BLOOD ADMINISTERED:none  DRAINS: Urinary Catheter (Foley)   LOCAL MEDICATIONS USED:  MARCAINE 0.5% 1:1 EXPAREL 1.3%  Amount: 30 ml  SPECIMEN:  No Specimen  DISPOSITION OF SPECIMEN:  N/A  COUNTS:  YES  TOURNIQUET:   Total Tourniquet Time Documented: Thigh (Right) - 80 minutes Total: Thigh (Right) - 80 minutes   DICTATION: .Viviann Spare Dictation  PLAN OF CARE: Admit to inpatient   PATIENT DISPOSITION:  PACU - hemodynamically stable.   Delay start of Pharmacological VTE agent (>24hrs) due to surgical blood loss or risk of bleeding: no

## 2019-06-12 DIAGNOSIS — T84032A Mechanical loosening of internal right knee prosthetic joint, initial encounter: Secondary | ICD-10-CM | POA: Diagnosis present

## 2019-06-12 LAB — CBC WITH DIFFERENTIAL/PLATELET
Abs Immature Granulocytes: 0.09 10*3/uL — ABNORMAL HIGH (ref 0.00–0.07)
Basophils Absolute: 0 10*3/uL (ref 0.0–0.1)
Basophils Relative: 0 %
Eosinophils Absolute: 0 10*3/uL (ref 0.0–0.5)
Eosinophils Relative: 0 %
HCT: 30.4 % — ABNORMAL LOW (ref 36.0–46.0)
Hemoglobin: 9.5 g/dL — ABNORMAL LOW (ref 12.0–15.0)
Immature Granulocytes: 1 %
Lymphocytes Relative: 9 %
Lymphs Abs: 1 10*3/uL (ref 0.7–4.0)
MCH: 30.6 pg (ref 26.0–34.0)
MCHC: 31.3 g/dL (ref 30.0–36.0)
MCV: 98.1 fL (ref 80.0–100.0)
Monocytes Absolute: 0.7 10*3/uL (ref 0.1–1.0)
Monocytes Relative: 7 %
Neutro Abs: 9.5 10*3/uL — ABNORMAL HIGH (ref 1.7–7.7)
Neutrophils Relative %: 83 %
Platelets: 203 10*3/uL (ref 150–400)
RBC: 3.1 MIL/uL — ABNORMAL LOW (ref 3.87–5.11)
RDW: 14.6 % (ref 11.5–15.5)
WBC: 11.4 10*3/uL — ABNORMAL HIGH (ref 4.0–10.5)
nRBC: 0 % (ref 0.0–0.2)

## 2019-06-12 LAB — BASIC METABOLIC PANEL
Anion gap: 8 (ref 5–15)
BUN: 27 mg/dL — ABNORMAL HIGH (ref 8–23)
CO2: 21 mmol/L — ABNORMAL LOW (ref 22–32)
Calcium: 8.8 mg/dL — ABNORMAL LOW (ref 8.9–10.3)
Chloride: 108 mmol/L (ref 98–111)
Creatinine, Ser: 1.24 mg/dL — ABNORMAL HIGH (ref 0.44–1.00)
GFR calc Af Amer: 48 mL/min — ABNORMAL LOW (ref >60)
GFR calc non Af Amer: 42 mL/min — ABNORMAL LOW (ref >60)
Glucose, Bld: 213 mg/dL — ABNORMAL HIGH (ref 70–99)
Potassium: 5 mmol/L (ref 3.5–5.1)
Sodium: 137 mmol/L (ref 135–145)

## 2019-06-12 LAB — GLUCOSE, CAPILLARY
Glucose-Capillary: 146 mg/dL — ABNORMAL HIGH (ref 70–99)
Glucose-Capillary: 145 mg/dL — ABNORMAL HIGH (ref 70–99)
Glucose-Capillary: 136 mg/dL — ABNORMAL HIGH (ref 70–99)
Glucose-Capillary: 150 mg/dL — ABNORMAL HIGH (ref 70–99)

## 2019-06-12 MED ORDER — IRBESARTAN 150 MG PO TABS
300.0000 mg | ORAL_TABLET | Freq: Every day | ORAL | Status: DC
  Administered 2019-06-12 – 2019-06-14 (×3): 300 mg via ORAL
  Filled 2019-06-12 (×3): qty 2

## 2019-06-12 MED ORDER — OXYCODONE-ACETAMINOPHEN 5-325 MG PO TABS
1.0000 | ORAL_TABLET | ORAL | Status: DC | PRN
  Administered 2019-06-12 – 2019-06-13 (×2): 1 via ORAL
  Filled 2019-06-12 (×3): qty 1

## 2019-06-12 NOTE — NC FL2 (Addendum)
[place] LEVEL OF CARE SCREENING TOOL     IDENTIFICATION  Patient Name: Autumn Chambers Birthdate: 1941/10/13 Sex: female Admission Date (Current Location): 06/09/2019  [facility] and [place] Number:  Herbalist and Address:  [facility],  [place], [place]      Provider Number: [id number]  Attending Physician Name and Address:  Jessy Oto, MD  Relative Name and Phone Number:    Enid Derry [phone]      Current Level of Care: Hospital Recommended Level of Care: [place] Prior Approval Number:    Date Approved/Denied:   PASRR Number:    Discharge Plan: Skill Nursing Facility     Current Diagnoses: Patient Active Problem List   Diagnosis Date Noted  . Closed displaced fracture of right patella 06/09/2019  . Idiopathic chronic gout, unspecified site, without tophus (tophi) 12/12/2016  . Dyspnea 06/26/2015  . Angina pectoris ([place]) 06/26/2015  . Former smoker 06/26/2015  . Obesity 06/26/2015  . Type 2 diabetes mellitus without complication ([place]) 08/81/1031  . OSA (obstructive sleep apnea) 06/26/2015  . Aortic stenosis 06/26/2015  . Breast cancer of upper-inner quadrant of left female breast ([place]) 09/05/2013  . Osteoporosis 08/22/2013  . GERD (gastroesophageal reflux disease) 08/22/2013  . Hypothyroidism 08/22/2013  . Anxiety 08/22/2013  . Hypertension 08/22/2013  . Diabetes due to undrl condition w oth diabetic kidney comp ([place]) 08/22/2013  . Osteoarthritis of left knee 08/02/2013    Class: Diagnosis of    Orientation RESPIRATION BLADDER Height & Weight     Self, Time, Situation, Place  Normal Continent Weight: 262 lb 12.6 oz (119.2 kg) Height:  5\' 6"  (167.6 cm)  BEHAVIORAL SYMPTOMS/MOOD NEUROLOGICAL BOWEL NUTRITION STATUS      Continent Diet(Carb Modified.)  AMBULATORY STATUS COMMUNICATION OF NEEDS Skin   Extensive Assist Verbally Surgical wounds                        Personal Care Assistance Level of Assistance  Bathing, Dressing, Feeding Bathing Assistance: Maximum assistance Feeding assistance: Independent Dressing Assistance: Maximum assistance     Functional Limitations Info  Sight, Hearing, Speech Sight Info: Impaired Hearing Info: Impaired Speech Info: Adequate    SPECIAL CARE FACTORS FREQUENCY  PT (By licensed PT), OT (By licensed OT)     PT Frequency: 5x/week OT Frequency: 5x/week            Contractures Contractures Info: Not present    Additional Factors Info  Code Status, Allergies, Psychotropic Code Status Info: Fullcode Allergies Info: Allergies: [place] Psychotropic Info: 3x's a day with meals         Current Medications (06/12/2019):  This is the current hospital active medication list Current Facility-Administered Medications  Medication Dose Route Frequency Provider Last Rate Last Dose  . 0.9 %  sodium chloride infusion   Intravenous Continuous Jessy Oto, MD 50 mL/hr at 06/12/19 0600    . acetaminophen (TYLENOL) tablet 325-650 mg  325-650 mg Oral Q6H PRN Jessy Oto, MD   650 mg at 06/11/19 2157  . diphenhydrAMINE (BENADRYL) capsule 25 mg  25 mg Oral QHS PRN Jessy Oto, MD       And  . acetaminophen (TYLENOL) tablet 500 mg  500 mg Oral QHS PRN Jessy Oto, MD      . allopurinol (ZYLOPRIM) tablet 100 mg  100 mg Oral QPC breakfast Jessy Oto, MD   100  mg at 06/12/19 0818  . aspirin tablet 325 mg  325 mg Oral Daily Jessy Oto, MD   325 mg at 06/12/19 0941  . bisacodyl (DULCOLAX) suppository 10 mg  10 mg Rectal Daily PRN Jessy Oto, MD      . cholecalciferol (VITAMIN D3) tablet 1,000 Units  1,000 Units Oral Daily Jessy Oto, MD   1,000 Units at 06/12/19 0941  . docusate sodium (COLACE) capsule 100 mg  100 mg Oral BID Jessy Oto, MD   100 mg at 06/12/19 0941  . ferrous sulfate tablet 325 mg  325 mg Oral Q breakfast Jessy Oto, MD   325 mg at 06/12/19 0818  . hydrALAZINE  (APRESOLINE) injection 10 mg  10 mg Intravenous Q6H PRN Jessy Oto, MD      . insulin aspart (novoLOG) injection 0-15 Units  0-15 Units Subcutaneous TID WC Jessy Oto, MD   2 Units at 06/12/19 1217  . irbesartan (AVAPRO) tablet 300 mg  300 mg Oral Daily Annita Brod, MD      . ketoconazole (NIZORAL) 2 % cream 1 application  1 application Topical BID PRN Jessy Oto, MD      . levothyroxine (SYNTHROID) tablet 137 mcg  137 mcg Oral QAC breakfast Jessy Oto, MD   137 mcg at 06/12/19 0555  . methocarbamol (ROBAXIN) tablet 500 mg  500 mg Oral Q6H PRN Jessy Oto, MD   500 mg at 06/11/19 1707   Or  . methocarbamol (ROBAXIN) 500 mg in dextrose 5 % 50 mL IVPB  500 mg Intravenous Q6H PRN Jessy Oto, MD      . metoCLOPramide (REGLAN) tablet 5-10 mg  5-10 mg Oral Q8H PRN Jessy Oto, MD       Or  . metoCLOPramide (REGLAN) injection 5-10 mg  5-10 mg Intravenous Q8H PRN Jessy Oto, MD      . multivitamin (PROSIGHT) tablet 1 tablet  1 tablet Oral BID Jessy Oto, MD   1 tablet at 06/12/19 0941  . oxyCODONE-acetaminophen (PERCOCET/ROXICET) 5-325 MG per tablet 1 tablet  1 tablet Oral Q4H PRN Annita Brod, MD   1 tablet at 06/12/19 1017  . polyethylene glycol (MIRALAX / GLYCOLAX) packet 17 g  17 g Oral Daily PRN Jessy Oto, MD      . sodium phosphate (FLEET) 7-19 GM/118ML enema 1 enema  1 enema Rectal Once PRN Jessy Oto, MD      . timolol (TIMOPTIC) 0.5 % ophthalmic solution 1 drop  1 drop Both Eyes BID Jessy Oto, MD   1 drop at 06/12/19 0948  . traMADol (ULTRAM) tablet 50 mg  50 mg Oral Q6H Jessy Oto, MD   50 mg at 06/12/19 3149     Discharge Medications: Please see discharge summary for a list of discharge medications.  Relevant Imaging Results:  Relevant Lab Results:   Additional Information [id number]  Lia Hopping, LCSW

## 2019-06-12 NOTE — Progress Notes (Signed)
Occupational Therapy Evaluation Patient Details Name: Autumn Chambers MRN: [id number] DOB: Jun 01, 1941 Today's Date: 06/12/2019    History of Present Illness Autumn Chambers is a 78 y.o. female admitted to [facility] with a Right displaced patella fracture, transverse, displaced, closed, periprosthetic TKR, and s/p right patellar ORIF with pins, wire, cannulated screws, cerclage wire patella to tibia on 06/11/19   Clinical Impression   Pt received with above diagnosis. PTA pt PLOF independent in most ADLs and IADLs while living alone in home environment. Pt currently requires assistance in ADLs due to pain, limited ROM, and functional standing balance. Pt will benefit from continued acute OT to address deficits and to prepare for safe transition to next venue. DC recommendation to SNF due to limited caregiver support in home environment.     Follow Up Recommendations  SNF    Equipment Recommendations  3 in 1 bedside commode    Recommendations for Other Services       Precautions / Restrictions Precautions Precautions: Knee;Fall Precaution Comments: biotech applied bledsoe brace prior to session Required Braces or Orthoses: Other Brace(bledsoe brace locked in extension) Restrictions Weight Bearing Restrictions: No RLE Weight Bearing: Weight bearing as tolerated      Mobility Bed Mobility Overal bed mobility: Needs Assistance Bed Mobility: Supine to Sit;Sit to Supine     Supine to sit: Mod assist Sit to supine: Mod assist   General bed mobility comments: verbal cues for technique, assist required for R LE over bed and scooting to EOB; assist required lower body to return to supine +2 to adjust while supine in bed.   Transfers                 General transfer comment: pt declined attempting due to feeling extremely "woozy" from pain meds and fearful of falling    Balance Overall balance assessment: History of Falls;Needs assistance Sitting-balance support: No upper  extremity supported;Feet supported Sitting balance-Leahy Scale: Fair Sitting balance - Comments: able to sit EOB without UE support                                   ADL either performed or assessed with clinical judgement   ADL Overall ADL's : Needs assistance/impaired Eating/Feeding: Set up;Bed level   Grooming: Wash/dry hands;Wash/dry face;Oral care;Set up;Sitting   Upper Body Bathing: Modified independent;Sitting   Lower Body Bathing: Minimal assistance;Sitting/lateral leans   Upper Body Dressing : Modified independent;Sitting   Lower Body Dressing: Moderate assistance;Sit to/from stand                 General ADL Comments: Functional transfer deferred due to pt reports of feeling "loopy" from medications. Fearful of falling.      Vision         Perception     Praxis      Pertinent Vitals/Pain Pain Assessment: 0-10 Pain Score: 3  Pain Location: right knee with movement Pain Descriptors / Indicators: Sore Pain Intervention(s): Monitored during session;Repositioned;Premedicated before session     Hand Dominance     Extremity/Trunk Assessment Upper Extremity Assessment Upper Extremity Assessment: Overall WFL for tasks assessed   Lower Extremity Assessment Lower Extremity Assessment: Defer to PT evaluation RLE Deficits / Details: assist required for mobility, maintained brace and knee extension, able to perform ankle pumps RLE: Unable to fully assess due to immobilization       Communication Communication Communication: HOH   Cognition Arousal/Alertness:  Awake/alert Behavior During Therapy: WFL for tasks assessed/performed Overall Cognitive Status: Within Functional Limits for tasks assessed                                     General Comments       Exercises     Shoulder Instructions      Home Living Family/patient expects to be discharged to:: Skilled nursing facility Living Arrangements: Alone                                       Prior Functioning/Environment Level of Independence: Independent                 OT Problem List: Decreased strength;Decreased range of motion;Decreased activity tolerance;Impaired balance (sitting and/or standing);Decreased safety awareness;Decreased knowledge of use of DME or AE;Decreased knowledge of precautions;Pain      OT Treatment/Interventions: Self-care/ADL training;Therapeutic exercise;DME and/or AE instruction;Therapeutic activities;Patient/family education;Balance training    OT Goals(Current goals can be found in the care plan section) Acute Rehab OT Goals Patient Stated Goal: return to previous independence OT Goal Formulation: With patient Time For Goal Achievement: 07/03/19 Potential to Achieve Goals: Good  OT Frequency: Min 2X/week   Barriers to D/C: Inaccessible home environment;Decreased caregiver support  Pt reports living at home alone, with no family not available in state, however reports helpful neighbors.        Co-evaluation              AM-PAC OT "6 Clicks" Daily Activity     Outcome Measure Help from another person eating meals?: A Little Help from another person taking care of personal grooming?: A Little Help from another person toileting, which includes using toliet, bedpan, or urinal?: A Lot Help from another person bathing (including washing, rinsing, drying)?: A Little Help from another person to put on and taking off regular upper body clothing?: A Little Help from another person to put on and taking off regular lower body clothing?: A Little 6 Click Score: 17   End of Session Equipment Utilized During Treatment: Right knee immobilizer Nurse Communication: Mobility status;Weight bearing status  Activity Tolerance: Patient limited by lethargy Patient left: in bed;with call bell/phone within reach;with bed alarm set;with nursing/sitter in room  OT Visit Diagnosis: Unsteadiness on feet  (R26.81);Repeated falls (R29.6);Pain Pain - Right/Left: Right Pain - part of body: Knee                Time: 7824-2353 OT Time Calculation (min): 31 min Charges:  OT General Charges $OT Visit: 1 Visit OT Evaluation $OT Eval Low Complexity: 1 Low OT Treatments $Self Care/Home Management : 8-22 mins  Minus Breeding, MSOT, OTR/L  Supplemental Rehabilitation Services  [phone]   Marius Ditch 06/12/2019, 1:16 PM

## 2019-06-12 NOTE — Care Management Important Message (Signed)
Important Message  Patient Details IM Letter given to Kathrin Greathouse SW to present to the Patient Name: Autumn Chambers MRN: [id number] Date of Birth: 12/08/1940   Medicare Important Message Given:  Yes     Kerin Salen 06/12/2019, 11:30 AM

## 2019-06-12 NOTE — Evaluation (Signed)
Physical Therapy Evaluation Patient Details Name: Autumn Chambers MRN: [id number] DOB: 02/03/1941 Today's Date: 06/12/2019   History of Present Illness  Autumn Chambers is a 78 y.o. female admitted to [facility] with a Right displaced patella fracture, transverse, displaced, closed, periprosthetic TKR, and s/p right patellar ORIF with pins, wire, cannulated screws, cerclage wire patella to tibia on 06/11/19  Clinical Impression  Patient is s/p above surgery resulting in functional limitations due to the deficits listed below (see PT Problem List).  Patient will benefit from skilled PT to increase their independence and safety with mobility to allow discharge to the venue listed below.  Pt educated to maintain brace at all times and maintain knee extension.  Pt plans to d/c to SNF since she lives alone.  Pt reports "woozy" head limiting mobility today however eager and motivated to return to previous level of independence as soon as possible.    Follow Up Recommendations SNF    Equipment Recommendations  None recommended by PT(per next venue)    Recommendations for Other Services       Precautions / Restrictions Precautions Precautions: Knee;Fall Precaution Comments: biotech applied bledsoe brace prior to session Required Braces or Orthoses: Other Brace(bledsoe brace locked in extension) Restrictions RLE Weight Bearing: Weight bearing as tolerated      Mobility  Bed Mobility Overal bed mobility: Needs Assistance Bed Mobility: Supine to Sit;Sit to Supine     Supine to sit: Mod assist;+2 for physical assistance Sit to supine: Max assist;+2 for physical assistance   General bed mobility comments: verbal cues for technique, assist required for R LE over bed and scooting to EOB; assist required for upper and lower body to return to supine  Transfers                 General transfer comment: pt declined attempting due to feeling extremely "woozy" from pain meds and fearful of  falling  Ambulation/Gait                Stairs            Wheelchair Mobility    Modified Rankin (Stroke Patients Only)       Balance Overall balance assessment: History of Falls;Needs assistance Sitting-balance support: No upper extremity supported;Feet supported Sitting balance-Leahy Scale: Fair Sitting balance - Comments: able to sit EOB without UE support                                     Pertinent Vitals/Pain Pain Assessment: 0-10 Pain Score: 3  Pain Location: right knee with movement Pain Descriptors / Indicators: Sore Pain Intervention(s): Repositioned;Premedicated before session;Monitored during session    Home Living Family/patient expects to be discharged to:: Skilled nursing facility Living Arrangements: Alone                    Prior Function Level of Independence: Independent               Hand Dominance        Extremity/Trunk Assessment        Lower Extremity Assessment Lower Extremity Assessment: RLE deficits/detail;Generalized weakness RLE Deficits / Details: assist required for mobility, maintained brace and knee extension, able to perform ankle pumps RLE: Unable to fully assess due to immobilization       Communication   Communication: HOH  Cognition Arousal/Alertness: Awake/alert Behavior During Therapy: WFL for tasks assessed/performed Overall  Cognitive Status: Within Functional Limits for tasks assessed                                        General Comments      Exercises     Assessment/Plan    PT Assessment Patient needs continued PT services  PT Problem List Decreased strength;Decreased mobility;Decreased activity tolerance;Decreased balance;Decreased knowledge of use of DME;Pain;Decreased knowledge of precautions       PT Treatment Interventions DME instruction;Gait training;Balance training;Therapeutic exercise;Functional mobility training;Therapeutic  activities;Patient/family education    PT Goals (Current goals can be found in the Care Plan section)  Acute Rehab PT Goals Patient Stated Goal: return to previous independence PT Goal Formulation: With patient Time For Goal Achievement: 06/19/19 Potential to Achieve Goals: Good    Frequency Min 3X/week   Barriers to discharge        Co-evaluation               AM-PAC PT "6 Clicks" Mobility  Outcome Measure Help needed turning from your back to your side while in a flat bed without using bedrails?: A Lot Help needed moving from lying on your back to sitting on the side of a flat bed without using bedrails?: A Lot Help needed moving to and from a bed to a chair (including a wheelchair)?: A Lot Help needed standing up from a chair using your arms (e.g., wheelchair or bedside chair)?: Total Help needed to walk in hospital room?: Total Help needed climbing 3-5 steps with a railing? : Total 6 Click Score: 9    End of Session   Activity Tolerance: Patient tolerated treatment well Patient left: in bed;with bed alarm set;with call bell/phone within reach   PT Visit Diagnosis: Other abnormalities of gait and mobility (R26.89);History of falling (Z91.81)    Time: 1105-1120 PT Time Calculation (min) (ACUTE ONLY): 15 min   Charges:   PT Evaluation $PT Eval Low Complexity: [place], PT, DPT Acute Rehabilitation Services Office: [phone] Pager: [phone]  Trena Platt 06/12/2019, 11:58 AM

## 2019-06-12 NOTE — Progress Notes (Signed)
PROGRESS NOTE  Autumn Chambers:[id number] DOB: January 13, 1941 DOA: 06/09/2019 PCP: Kathyrn Lass, MD  HPI/Recap of past 43 hours: 78 year old female with past medical history of diabetes mellitus and hypertension admitted by orthopedic surgery for displaced right patella fracture.  Hospitalist consulted for comanagement.  Patient overall stable.  Noted to have mild holosystolic murmur, echocardiogram ordered which revealed incidental finding of diastolic dysfunction, otherwise okay for surgery.  Patient underwent ORIF on 8/11  Postop day 1 today, patient doing okay, complains of some very mild right patellar soreness.  Had some confusion postop, but since then is now fully alert and oriented  Assessment/Plan: Principal Problem:   Dyspnea Active Problems:   Closed displaced fracture of right patella: Okay for surgery. Chronic diastolic heart failure: Incidentally noted on echocardiogram.  Normal BNP.  Appears euvolemic Morbid obesity: Patient meets criteria with BMI greater than 40 Essential hypertension: Will restart Avapro.  Restart HCTZ upon discharge Cardiac murmur: Stable, okay for surgery Diabetes mellitus: Continue Januvia, sliding scale insulin, CBGs yesterday little bit higher as we expected following surgery.  Code Status: Full code  Family Communication: Spoke with friend at the bedside yesterday  Disposition Plan: Skilled nursing in the next few days   Consultants:  Hospitalists  Procedures:  8/11: Status post ORIF of displaced right patella  Antimicrobials:  Preop Ancef  DVT prophylaxis: SCDs   Objective: Vitals:   06/12/19 0409 06/12/19 0937  BP: 135/64 (!) 130/55  Pulse: 81 89  Resp: 14 16  Temp: 98 F (36.7 C) 97.9 F (36.6 C)  SpO2: 96% 98%    Intake/Output Summary (Last 24 hours) at 06/12/2019 1225 Last data filed at 06/12/2019 2637 Gross per 24 hour  Intake 4565.34 ml  Output 2275 ml  Net 2290.34 ml   Filed Weights   06/10/19 0203   Weight: 119.2 kg   Body mass index is 42.42 kg/m.  Exam:   General: Alert and oriented x3, no acute distress  HEENT: Normocephalic and atraumatic, mucous membranes are moist  Neck: Thick, narrow airway  Cardiovascular: Regular rate and rhythm, 2/6 holosystolic murmur  Respiratory: Clear to auscultation bilaterally  Abdomen: Soft, nontender, nondistended, positive bowel sounds  Musculoskeletal: Right lower extremity immobilized   Data Reviewed: CBC: Recent Labs  Lab 06/09/19 2153 06/10/19 0246 06/11/19 0251 06/12/19 0308  WBC 12.0* 9.9 6.1 11.4*  NEUTROABS  --   --   --  9.5*  HGB 11.2* 10.6* 10.3* 9.5*  HCT 35.3* 33.6* 32.9* 30.4*  MCV 97.5 97.4 98.8 98.1  PLT 253 253 213 858   Basic Metabolic Panel: Recent Labs  Lab 06/09/19 2153 06/10/19 0246 06/11/19 0251 06/12/19 0308  NA 137 136 138 137  K 4.7 4.4 4.3 5.0  CL 108 106 108 108  CO2 19* 20* 21* 21*  GLUCOSE 211* 175* 153* 213*  BUN 29* 27* 28* 27*  CREATININE 1.25* 1.25* 1.30* 1.24*  CALCIUM 9.6 9.1 9.1 8.8*   GFR: Estimated Creatinine Clearance: 49.2 mL/min (A) (by C-G formula based on SCr of 1.24 mg/dL (H)). Liver Function Tests: Recent Labs  Lab 06/09/19 2153  AST 18  ALT 18  ALKPHOS 74  BILITOT 0.6  PROT 7.6  ALBUMIN 3.7   No results for input(s): LIPASE, AMYLASE in the last 168 hours. No results for input(s): AMMONIA in the last 168 hours. Coagulation Profile: Recent Labs  Lab 06/11/19 0251  INR 1.1   Cardiac Enzymes: No results for input(s): CKTOTAL, CKMB, CKMBINDEX, TROPONINI in the last 168  hours. BNP (last 3 results) No results for input(s): PROBNP in the last 8760 hours. HbA1C: Recent Labs    06/10/19 0246  HGBA1C 7.4*   CBG: Recent Labs  Lab 06/11/19 1148 06/11/19 1653 06/11/19 2003 06/12/19 0734 06/12/19 1140  GLUCAP 180* 210* 216* 146* 145*   Lipid Profile: No results for input(s): CHOL, HDL, LDLCALC, TRIG, CHOLHDL, LDLDIRECT in the last 72 hours. Thyroid  Function Tests: Recent Labs    06/10/19 1039  TSH 0.719   Anemia Panel: No results for input(s): VITAMINB12, FOLATE, FERRITIN, TIBC, IRON, RETICCTPCT in the last 72 hours. Urine analysis:    Component Value Date/Time   COLORURINE YELLOW 05/26/2008 1453   APPEARANCEUR CLOUDY (A) 05/26/2008 1453   LABSPEC 1.017 05/26/2008 1453   PHURINE 6.0 05/26/2008 1453   GLUCOSEU NEGATIVE 05/26/2008 1453   HGBUR NEGATIVE 05/26/2008 1453   BILIRUBINUR NEGATIVE 05/26/2008 1453   KETONESUR NEGATIVE 05/26/2008 1453   PROTEINUR NEGATIVE 05/26/2008 1453   UROBILINOGEN 0.2 05/26/2008 1453   NITRITE NEGATIVE 05/26/2008 1453   LEUKOCYTESUR LARGE (A) 05/26/2008 1453   Sepsis Labs: @LABRCNTIP (procalcitonin:4,lacticidven:4)  ) Recent Results (from the past 240 hour(s))  SARS Coronavirus 2 [facility] order, Performed in [facility] hospital lab) Nasopharyngeal Nasopharyngeal Swab     Status: None   Collection Time: 06/09/19  9:55 PM   Specimen: Nasopharyngeal Swab  Result Value Ref Range Status   SARS Coronavirus 2 NEGATIVE NEGATIVE Final    Comment: (NOTE) If result is NEGATIVE SARS-CoV-2 target nucleic acids are NOT DETECTED. The SARS-CoV-2 RNA is generally detectable in upper and lower  respiratory specimens during the acute phase of infection. The lowest  concentration of SARS-CoV-2 viral copies this assay can detect is 250  copies / mL. A negative result does not preclude SARS-CoV-2 infection  and should not be used as the sole basis for treatment or other  patient management decisions.  A negative result may occur with  improper specimen collection / handling, submission of specimen other  than nasopharyngeal swab, presence of viral mutation(s) within the  areas targeted by this assay, and inadequate number of viral copies  (<250 copies / mL). A negative result must be combined with clinical  observations, patient history, and epidemiological information. If result is POSITIVE SARS-CoV-2  target nucleic acids are DETECTED. The SARS-CoV-2 RNA is generally detectable in upper and lower  respiratory specimens dur ing the acute phase of infection.  Positive  results are indicative of active infection with SARS-CoV-2.  Clinical  correlation with patient history and other diagnostic information is  necessary to determine patient infection status.  Positive results do  not rule out bacterial infection or co-infection with other viruses. If result is PRESUMPTIVE POSTIVE SARS-CoV-2 nucleic acids MAY BE PRESENT.   A presumptive positive result was obtained on the submitted specimen  and confirmed on repeat testing.  While 2019 novel coronavirus  (SARS-CoV-2) nucleic acids may be present in the submitted sample  additional confirmatory testing may be necessary for epidemiological  and / or clinical management purposes  to differentiate between  SARS-CoV-2 and other Sarbecovirus currently known to infect humans.  If clinically indicated additional testing with an alternate test  methodology [phone]) is advised. The SARS-CoV-2 RNA is generally  detectable in upper and lower respiratory sp ecimens during the acute  phase of infection. The expected result is Negative. Fact Sheet for Patients:  StrictlyIdeas.no Fact Sheet for Healthcare Providers: BankingDealers.co.za This test is not yet approved or cleared by the [place]  FDA and has been authorized for detection and/or diagnosis of SARS-CoV-2 by FDA under an Emergency Use Authorization (EUA).  This EUA will remain in effect (meaning this test can be used) for the duration of the COVID-19 declaration under Section 564(b)(1) of the Act, 21 U.S.C. section 360bbb-3(b)(1), unless the authorization is terminated or revoked sooner. Performed at [facility], [place] [address], [place], [place] [address]   Surgical pcr screen     Status: Abnormal   Collection Time:  06/10/19 10:32 PM   Specimen: Nasal Mucosa; Nasal Swab  Result Value Ref Range Status   MRSA, PCR NEGATIVE NEGATIVE Final   Staphylococcus aureus POSITIVE (A) NEGATIVE Final    Comment: (NOTE) The Xpert SA Assay (FDA approved for NASAL specimens in patients 96 years of age and older), is one component of a comprehensive surveillance program. It is not intended to diagnose infection nor to guide or monitor treatment. Performed at [facility], [place] [address] [place], [place] [address]       Studies: Dg Knee 1-2 Views Right  Result Date: 06/11/2019 CLINICAL DATA:  ORIF of patellar fracture EXAM: RIGHT KNEE - 1-2 VIEW COMPARISON:  None. FLUOROSCOPY TIME:  Radiation Exposure Index (as provided by the fluoroscopic device): 0.24 mGy If the device does not provide the exposure index: Fluoroscopy Time:  3 seconds Number of Acquired Images:  3 FINDINGS: Right knee prosthesis is again seen. Patellar fracture has been reduced on the lateral view. No other focal abnormality is noted. IMPRESSION: Reduction of patellar fracture. Electronically Signed   By: Inez Catalina M.D.   On: 06/11/2019 16:44   Dg C-arm 1-60 Min-no Report  Result Date: 06/11/2019 Fluoroscopy was utilized by the requesting physician.  No radiographic interpretation.    Scheduled Meds: . allopurinol  100 mg Oral QPC breakfast  . aspirin  325 mg Oral Daily  . cholecalciferol  1,000 Units Oral Daily  . docusate sodium  100 mg Oral BID  . ferrous sulfate  325 mg Oral Q breakfast  . insulin aspart  0-15 Units Subcutaneous TID WC  . irbesartan  300 mg Oral Daily  . levothyroxine  137 mcg Oral QAC breakfast  . multivitamin  1 tablet Oral BID  . timolol  1 drop Both Eyes BID  . traMADol  50 mg Oral Q6H    Continuous Infusions: . sodium chloride 50 mL/hr at 06/12/19 0600  . methocarbamol (ROBAXIN) IV       LOS: 3 days     Annita Brod, MD [facility]  To reach me or the doctor on call,  go to: [email or web address] Password [facility]  06/12/2019, 12:25 PM

## 2019-06-12 NOTE — Plan of Care (Signed)
Progressing, SNF tomorrow

## 2019-06-12 NOTE — Anesthesia Postprocedure Evaluation (Signed)
Anesthesia Post Note  Patient: Autumn Chambers  Procedure(s) Performed: OPEN REDUCTION INTERNAL (ORIF) FIXATION RIGHT PATELLA FRACTURE WITH PINS, WIRE, CANNULATED SCREWS, CERCLAGE WIRE PATELLA TO TIBIA (Right )     Patient location during evaluation: PACU Anesthesia Type: General and Regional Level of consciousness: awake and alert Pain management: pain level controlled Vital Signs Assessment: post-procedure vital signs reviewed and stable Respiratory status: spontaneous breathing, nonlabored ventilation, respiratory function stable and patient connected to nasal cannula oxygen Cardiovascular status: blood pressure returned to baseline and stable Postop Assessment: no apparent nausea or vomiting Anesthetic complications: no    Last Vitals:  Vitals:   06/12/19 0128 06/12/19 0409  BP: 135/66 135/64  Pulse: 85 81  Resp: 18 14  Temp: 36.7 C 36.7 C  SpO2: 96% 96%    Last Pain:  Vitals:   06/12/19 0555  TempSrc:   PainSc: 4                  Barnet Glasgow

## 2019-06-12 NOTE — Progress Notes (Signed)
     Subjective: 1 Day Post-Op Procedure(s) (LRB): OPEN REDUCTION INTERNAL (ORIF) FIXATION RIGHT PATELLA FRACTURE WITH PINS, WIRE, CANNULATED SCREWS, CERCLAGE WIRE PATELLA TO TIBIA (Right) Awake, alert, hard of hearing, foley to SD may be d/ced today. Tolerating pos, family aware she is in the hospital. Social service for SNF placement.  Patient reports pain as moderate.    Objective:   VITALS:  Temp:  [97.6 F (36.4 C)-98.2 F (36.8 C)] 98 F (36.7 C) (08/12 0409) Pulse Rate:  [81-103] 81 (08/12 0409) Resp:  [12-22] 14 (08/12 0409) BP: (135-187)/(64-73) 135/64 (08/12 0409) SpO2:  [95 %-100 %] 96 % (08/12 0409)  Neurologically intact ABD soft Neurovascular intact Sensation intact distally Intact pulses distally Dorsiflexion/Plantar flexion intact Incision: dressing C/D/I Compartment soft   LABS Recent Labs    06/09/19 2153 06/10/19 0246 06/11/19 0251 06/12/19 0308  HGB 11.2* 10.6* 10.3* 9.5*  WBC 12.0* 9.9 6.1 11.4*  PLT 253 253 213 203   Recent Labs    06/11/19 0251 06/12/19 0308  NA 138 137  K 4.3 5.0  CL 108 108  CO2 21* 21*  BUN 28* 27*  CREATININE 1.30* 1.24*  GLUCOSE 153* 213*   Recent Labs    06/11/19 0251  INR 1.1     Assessment/Plan: 1 Day Post-Op Procedure(s) (LRB): OPEN REDUCTION INTERNAL (ORIF) FIXATION RIGHT PATELLA FRACTURE WITH PINS, WIRE, CANNULATED SCREWS, CERCLAGE WIRE PATELLA TO TIBIA (Right)  Advance diet Up with therapy D/C IV fluids Discharge to SNF  [name] 06/12/2019, 8:10 AMPatient ID: Autumn Chambers, female   DOB: June 19, 1941, 78 y.o.   MRN: [id number]

## 2019-06-12 NOTE — TOC Initial Note (Signed)
Transition of Care [facility]) - Initial/Assessment Note    Patient Details  Name: Autumn Chambers MRN: [id number] Date of Birth: 03-19-1941  [facility] CM/SW Contact:    Lia Hopping, [place] Phone Number: 06/12/2019, 5:02 PM  Clinical Narrative: Patient admitted for a mechanical fall. Right displaced patella fracture, transverse, displaced, closed, periprosthetic TKR.                  Patient HOA, wears hearing aides. Patient friend at bedside to assist with discharge planning. Patient agreeable to SNF. CSW explain SNF process and will follow up with bed offers.  Physician will need to sign 30 day note for PASRR (at nurses station). COVID-19 test pending placement.   Expected Discharge Plan: Skilled Nursing Facility Barriers to Discharge: Awaiting State Approval (PASRR)(COVID-19 Test results)   Patient Goals and CMS Choice Patient states their goals for this hospitalization and ongoing recovery are:: to get better CMS Medicare.gov Compare Post Acute Care list provided to:: Patient Choice offered to / list presented to : Patient  Expected Discharge Plan and Services Expected Discharge Plan: [place] In-house Referral: Clinical Social Work     Living arrangements for the past 2 months: [place]                                      Prior Living Arrangements/Services Living arrangements for the past 2 months: Single Family Home Lives with:: Spouse Patient language and need for interpreter reviewed:: No Do you feel safe going back to the place where you live?: Yes      Need for Family Participation in Patient Care: Yes (Comment) Care giver support system in place?: Yes (comment)   Criminal Activity/Legal Involvement Pertinent to Current Situation/Hospitalization: No - Comment as needed  Activities of Daily Living Home Assistive Devices/Equipment: None ADL Screening (condition at time of admission) Patient's cognitive ability  adequate to safely complete daily activities?: Yes Is the patient deaf or have difficulty hearing?: Yes Does the patient have difficulty seeing, even when wearing glasses/contacts?: No Does the patient have difficulty concentrating, remembering, or making decisions?: No Patient able to express need for assistance with ADLs?: Yes Does the patient have difficulty dressing or bathing?: No Independently performs ADLs?: Yes (appropriate for developmental age) Does the patient have difficulty walking or climbing stairs?: No Weakness of Legs: None Weakness of Arms/Hands: None  Permission Sought/Granted Permission sought to share information with : Family Supports Permission granted to share information with : Yes, Verbal Permission Granted        Permission granted to share info w Relationship: Friend-     Emotional Assessment Appearance:: Appears stated age   Affect (typically observed): Accepting, Calm Orientation: : Oriented to Self, Oriented to Place, Oriented to  Time, Oriented to Situation Alcohol / Substance Use: Not Applicable Psych Involvement: No (comment)  Admission diagnosis:  Fall, initial encounter [W19.XXXA] Closed nondisplaced transverse fracture of right patella, initial encounter [S82.034A] Patient Active Problem List   Diagnosis Date Noted  . Closed displaced fracture of right patella 06/09/2019  . Idiopathic chronic gout, unspecified site, without tophus (tophi) 12/12/2016  . Dyspnea 06/26/2015  . Angina pectoris ([place]) 06/26/2015  . Former smoker 06/26/2015  . Obesity 06/26/2015  . Type 2 diabetes mellitus without complication ([place]) 08/65/7846  . OSA (obstructive sleep apnea) 06/26/2015  . Aortic stenosis 06/26/2015  . Breast cancer of upper-inner quadrant  of left female breast ([place]) 09/05/2013  . Osteoporosis 08/22/2013  . GERD (gastroesophageal reflux disease) 08/22/2013  . Hypothyroidism 08/22/2013  . Anxiety 08/22/2013  . Hypertension 08/22/2013  .  Diabetes due to undrl condition w oth diabetic kidney comp ([place]) 08/22/2013  . Osteoarthritis of left knee 08/02/2013    Class: Diagnosis of   PCP:  Kathyrn Lass, MD Pharmacy:   [facility] - [address] [place] [address] [place] [address] Phone: [phone] Fax: [phone]  [facility] [place], [place] [address] [place] [address] Phone: [phone] Fax: [place], [place] [address] Phone: [phone] Fax: [phone]     Social Determinants of Health (SDOH) Interventions    Readmission Risk Interventions No flowsheet data found.

## 2019-06-13 ENCOUNTER — Encounter (HOSPITAL_COMMUNITY): Payer: Self-pay | Admitting: Specialist

## 2019-06-13 DIAGNOSIS — S82034A Nondisplaced transverse fracture of right patella, initial encounter for closed fracture: Secondary | ICD-10-CM

## 2019-06-13 LAB — CBC
HCT: 30.6 % — ABNORMAL LOW (ref 36.0–46.0)
Hemoglobin: 9.5 g/dL — ABNORMAL LOW (ref 12.0–15.0)
MCH: 30.8 pg (ref 26.0–34.0)
MCHC: 31 g/dL (ref 30.0–36.0)
MCV: 99.4 fL (ref 80.0–100.0)
Platelets: 214 10*3/uL (ref 150–400)
RBC: 3.08 MIL/uL — ABNORMAL LOW (ref 3.87–5.11)
RDW: 15.2 % (ref 11.5–15.5)
WBC: 8.9 10*3/uL (ref 4.0–10.5)
nRBC: 0 % (ref 0.0–0.2)

## 2019-06-13 LAB — GLUCOSE, CAPILLARY
Glucose-Capillary: 142 mg/dL — ABNORMAL HIGH (ref 70–99)
Glucose-Capillary: 143 mg/dL — ABNORMAL HIGH (ref 70–99)
Glucose-Capillary: 149 mg/dL — ABNORMAL HIGH (ref 70–99)
Glucose-Capillary: 140 mg/dL — ABNORMAL HIGH (ref 70–99)

## 2019-06-13 MED ORDER — HYDROCHLOROTHIAZIDE 25 MG PO TABS
25.0000 mg | ORAL_TABLET | Freq: Every day | ORAL | Status: DC
  Administered 2019-06-14: 25 mg via ORAL
  Filled 2019-06-13: qty 1

## 2019-06-13 MED ORDER — POLYETHYLENE GLYCOL 3350 17 G PO PACK
17.0000 g | PACK | Freq: Once | ORAL | Status: AC
  Administered 2019-06-13: 17 g via ORAL
  Filled 2019-06-13: qty 1

## 2019-06-13 NOTE — Discharge Summary (Signed)
Physician Discharge Summary      Patient ID: Autumn Chambers MRN: [id number] DOB/AGE: 01-03-1941 79 y.o.  Admit date: 06/09/2019 Discharge date:06/14/2019   Admission Diagnoses:  Principal Problem:   Closed displaced fracture of right patella Active Problems:   Dyspnea Vitamin D deficiency  Discharge Diagnoses:  Same  Past Medical History:  Diagnosis Date  . Anemia   . Arthritis   . Cancer ([place])    hx of skin ca  . Chronic kidney disease    CRI- sees Dr Posey Pronto  . Diabetes mellitus    oral  . Gout   . H/O hiatal hernia   . Hearing loss   . Hyperlipidemia   . Hypertension   . Hypothyroidism   . Irritable bowel   . Osteoporosis 02/2013   T score -2.9  . PONV (postoperative nausea and vomiting)    yearsago  . Shortness of breath    "mild upon exertion"  . Urinary tract infection    hx of    Surgeries: Procedure(s): OPEN REDUCTION INTERNAL (ORIF) FIXATION RIGHT PATELLA FRACTURE WITH PINS, WIRE, CANNULATED SCREWS, CERCLAGE WIRE PATELLA TO TIBIA on 06/11/2019   Consultants: Treatment Team:  Jessy Oto, MD  Discharged Condition: Improved  Hospital Course: Autumn Chambers is an 78 y.o. female who was admitted 06/09/2019 with a chief complaint of  Chief Complaint  Patient presents with  . Knee Pain  , and found to have a diagnosis of Closed displaced fracture of right patella.  She was brought to the operating room on 06/11/2019 and underwent the above named procedures.    She was given perioperative antibiotics:  Anti-infectives (From admission, onward)   Start     Dose/Rate Route Frequency Ordered Stop   06/11/19 1417  polymyxin B 500,000 Units, bacitracin 50,000 Units in sodium chloride 0.9 % 500 mL irrigation  Status:  Discontinued       As needed 06/11/19 1417 06/11/19 1520   06/11/19 1200  ceFAZolin (ANCEF) IVPB 2g/100 mL premix     2 g 200 mL/hr over 30 Minutes Intravenous On call to O.R. 06/10/19 2001 06/11/19 1334   06/11/19 0745  ceFAZolin (ANCEF)  IVPB 2g/100 mL premix  Status:  Discontinued     2 g 200 mL/hr over 30 Minutes Intravenous On call to O.R. 06/11/19 4562 06/11/19 0742    Prior to surgery she was admitted and internal medicine consulted due to shortness of breath experience while at  [facility]. She underwent evaluation with  Echocardiogram demonstrating EF 60-65% with mild aortic stenosis. She was cleared for surgical treatment of the right patella fracture. She was brought to the operating room on 06/11/2019 and underwent the above named procedures.   POD#1 stood and foley discontinued, ambulated with walker very short distances. Social service initiated SNF placement. Right leg NV normal. POD#2 progress with therapy voided and was out of bed to a commode. SNF located [facility](formally the [facility]). She understands that she will be transferred there on POD#3. Dressing changed and aquacell dressing applied, this should remain in place for 2 weeks. She was given perioperative antibiotics of ancef. She was given sequential compression devices, early ambulation, and aspirin chemoprophylaxis for DVT prophylaxis. Extremely hard of hearing requires her hearing aids to hear at all. Aspirin for anti DVT prophylaxis. PT and OT to work on progressive ambulation weight bear as tolerated right leg with walker and assistance. Bledsoe brace right leg, with knee in full extension at all times even in  bathing. Use a bag to keep brace dry. My use shower with right leg bag. She benefited maximally from their hospital stay and there were no complications.    Recent vital signs:  Vitals:   06/13/19 2243 06/14/19 0623  BP: (!) 155/73 (!) 146/76  Pulse: (!) 106 (!) 106  Resp: 18 16  Temp: 99.3 F (37.4 C) 98.7 F (37.1 C)  SpO2: 98% 95%    Recent laboratory studies:  Results for orders placed or performed during the hospital encounter of 06/09/19  SARS Coronavirus 2 [facility] order, Performed in [facility] hospital lab)  Nasopharyngeal Nasopharyngeal Swab   Specimen: Nasopharyngeal Swab  Result Value Ref Range   SARS Coronavirus 2 NEGATIVE NEGATIVE  Surgical pcr screen   Specimen: Nasal Mucosa; Nasal Swab  Result Value Ref Range   MRSA, PCR NEGATIVE NEGATIVE   Staphylococcus aureus POSITIVE (A) NEGATIVE  SARS CORONAVIRUS 2 Nasal Swab Aptima Multi Swab   Specimen: Aptima Multi Swab; Nasal Swab  Result Value Ref Range   SARS Coronavirus 2 NEGATIVE NEGATIVE  CBC  Result Value Ref Range   WBC 12.0 (H) 4.0 - 10.5 K/uL   RBC 3.62 (L) 3.87 - 5.11 MIL/uL   Hemoglobin 11.2 (L) 12.0 - 15.0 g/dL   HCT 35.3 (L) 36.0 - 46.0 %   MCV 97.5 80.0 - 100.0 fL   MCH 30.9 26.0 - 34.0 pg   MCHC 31.7 30.0 - 36.0 g/dL   RDW 14.7 11.5 - 15.5 %   Platelets 253 150 - 400 K/uL   nRBC 0.0 0.0 - 0.2 %  Comprehensive metabolic panel  Result Value Ref Range   Sodium 137 135 - 145 mmol/L   Potassium 4.7 3.5 - 5.1 mmol/L   Chloride 108 98 - 111 mmol/L   CO2 19 (L) 22 - 32 mmol/L   Glucose, Bld 211 (H) 70 - 99 mg/dL   BUN 29 (H) 8 - 23 mg/dL   Creatinine, Ser 1.25 (H) 0.44 - 1.00 mg/dL   Calcium 9.6 8.9 - 10.3 mg/dL   Total Protein 7.6 6.5 - 8.1 g/dL   Albumin 3.7 3.5 - 5.0 g/dL   AST 18 15 - 41 U/L   ALT 18 0 - 44 U/L   Alkaline Phosphatase 74 38 - 126 U/L   Total Bilirubin 0.6 0.3 - 1.2 mg/dL   GFR calc non Af Amer 41 (L) >60 mL/min   GFR calc Af Amer 48 (L) >60 mL/min   Anion gap 10 5 - 15  CBC  Result Value Ref Range   WBC 9.9 4.0 - 10.5 K/uL   RBC 3.45 (L) 3.87 - 5.11 MIL/uL   Hemoglobin 10.6 (L) 12.0 - 15.0 g/dL   HCT 33.6 (L) 36.0 - 46.0 %   MCV 97.4 80.0 - 100.0 fL   MCH 30.7 26.0 - 34.0 pg   MCHC 31.5 30.0 - 36.0 g/dL   RDW 14.7 11.5 - 15.5 %   Platelets 253 150 - 400 K/uL   nRBC 0.0 0.0 - 0.2 %  Basic metabolic panel  Result Value Ref Range   Sodium 136 135 - 145 mmol/L   Potassium 4.4 3.5 - 5.1 mmol/L   Chloride 106 98 - 111 mmol/L   CO2 20 (L) 22 - 32 mmol/L   Glucose, Bld 175 (H) 70 - 99 mg/dL    BUN 27 (H) 8 - 23 mg/dL   Creatinine, Ser 1.25 (H) 0.44 - 1.00 mg/dL   Calcium 9.1 8.9 - 10.3  mg/dL   GFR calc non Af Amer 41 (L) >60 mL/min   GFR calc Af Amer 48 (L) >60 mL/min   Anion gap 10 5 - 15  Hemoglobin A1c  Result Value Ref Range   Hgb A1c MFr Bld 7.4 (H) 4.8 - 5.6 %   Mean Plasma Glucose 165.68 mg/dL  Brain natriuretic peptide  Result Value Ref Range   B Natriuretic Peptide 57.9 0.0 - 100.0 pg/mL  VITAMIN D 25 Hydroxy (Vit-D Deficiency, Fractures)  Result Value Ref Range   Vit D, 25-Hydroxy 28.2 (L) 30.0 - 100.0 ng/mL  TSH  Result Value Ref Range   TSH 0.719 0.350 - 4.500 uIU/mL  Glucose, capillary  Result Value Ref Range   Glucose-Capillary 145 (H) 70 - 99 mg/dL  Glucose, capillary  Result Value Ref Range   Glucose-Capillary 161 (H) 70 - 99 mg/dL  Glucose, capillary  Result Value Ref Range   Glucose-Capillary 143 (H) 70 - 99 mg/dL  Protime-INR  Result Value Ref Range   Prothrombin Time 13.6 11.4 - 15.2 seconds   INR 1.1 0.8 - 1.2  APTT  Result Value Ref Range   aPTT 33 24 - 36 seconds  CBC  Result Value Ref Range   WBC 6.1 4.0 - 10.5 K/uL   RBC 3.33 (L) 3.87 - 5.11 MIL/uL   Hemoglobin 10.3 (L) 12.0 - 15.0 g/dL   HCT 32.9 (L) 36.0 - 46.0 %   MCV 98.8 80.0 - 100.0 fL   MCH 30.9 26.0 - 34.0 pg   MCHC 31.3 30.0 - 36.0 g/dL   RDW 14.9 11.5 - 15.5 %   Platelets 213 150 - 400 K/uL   nRBC 0.0 0.0 - 0.2 %  Basic metabolic panel  Result Value Ref Range   Sodium 138 135 - 145 mmol/L   Potassium 4.3 3.5 - 5.1 mmol/L   Chloride 108 98 - 111 mmol/L   CO2 21 (L) 22 - 32 mmol/L   Glucose, Bld 153 (H) 70 - 99 mg/dL   BUN 28 (H) 8 - 23 mg/dL   Creatinine, Ser 1.30 (H) 0.44 - 1.00 mg/dL   Calcium 9.1 8.9 - 10.3 mg/dL   GFR calc non Af Amer 39 (L) >60 mL/min   GFR calc Af Amer 46 (L) >60 mL/min   Anion gap 9 5 - 15  Glucose, capillary  Result Value Ref Range   Glucose-Capillary 141 (H) 70 - 99 mg/dL  Glucose, capillary  Result Value Ref Range    Glucose-Capillary 160 (H) 70 - 99 mg/dL  Glucose, capillary  Result Value Ref Range   Glucose-Capillary 188 (H) 70 - 99 mg/dL  Glucose, capillary  Result Value Ref Range   Glucose-Capillary 180 (H) 70 - 99 mg/dL  Glucose, capillary  Result Value Ref Range   Glucose-Capillary 210 (H) 70 - 99 mg/dL  CBC with Differential/Platelet  Result Value Ref Range   WBC 11.4 (H) 4.0 - 10.5 K/uL   RBC 3.10 (L) 3.87 - 5.11 MIL/uL   Hemoglobin 9.5 (L) 12.0 - 15.0 g/dL   HCT 30.4 (L) 36.0 - 46.0 %   MCV 98.1 80.0 - 100.0 fL   MCH 30.6 26.0 - 34.0 pg   MCHC 31.3 30.0 - 36.0 g/dL   RDW 14.6 11.5 - 15.5 %   Platelets 203 150 - 400 K/uL   nRBC 0.0 0.0 - 0.2 %   Neutrophils Relative % 83 %   Neutro Abs 9.5 (H) 1.7 - 7.7 K/uL  Lymphocytes Relative 9 %   Lymphs Abs 1.0 0.7 - 4.0 K/uL   Monocytes Relative 7 %   Monocytes Absolute 0.7 0.1 - 1.0 K/uL   Eosinophils Relative 0 %   Eosinophils Absolute 0.0 0.0 - 0.5 K/uL   Basophils Relative 0 %   Basophils Absolute 0.0 0.0 - 0.1 K/uL   Immature Granulocytes 1 %   Abs Immature Granulocytes 0.09 (H) 0.00 - 0.07 K/uL  Basic metabolic panel  Result Value Ref Range   Sodium 137 135 - 145 mmol/L   Potassium 5.0 3.5 - 5.1 mmol/L   Chloride 108 98 - 111 mmol/L   CO2 21 (L) 22 - 32 mmol/L   Glucose, Bld 213 (H) 70 - 99 mg/dL   BUN 27 (H) 8 - 23 mg/dL   Creatinine, Ser 1.24 (H) 0.44 - 1.00 mg/dL   Calcium 8.8 (L) 8.9 - 10.3 mg/dL   GFR calc non Af Amer 42 (L) >60 mL/min   GFR calc Af Amer 48 (L) >60 mL/min   Anion gap 8 5 - 15  Glucose, capillary  Result Value Ref Range   Glucose-Capillary 216 (H) 70 - 99 mg/dL  Glucose, capillary  Result Value Ref Range   Glucose-Capillary 146 (H) 70 - 99 mg/dL  Glucose, capillary  Result Value Ref Range   Glucose-Capillary 145 (H) 70 - 99 mg/dL  CBC  Result Value Ref Range   WBC 8.9 4.0 - 10.5 K/uL   RBC 3.08 (L) 3.87 - 5.11 MIL/uL   Hemoglobin 9.5 (L) 12.0 - 15.0 g/dL   HCT 30.6 (L) 36.0 - 46.0 %   MCV  99.4 80.0 - 100.0 fL   MCH 30.8 26.0 - 34.0 pg   MCHC 31.0 30.0 - 36.0 g/dL   RDW 15.2 11.5 - 15.5 %   Platelets 214 150 - 400 K/uL   nRBC 0.0 0.0 - 0.2 %  Glucose, capillary  Result Value Ref Range   Glucose-Capillary 136 (H) 70 - 99 mg/dL  Glucose, capillary  Result Value Ref Range   Glucose-Capillary 150 (H) 70 - 99 mg/dL   Comment 1 Notify RN    Comment 2 Document in Chart   Glucose, capillary  Result Value Ref Range   Glucose-Capillary 142 (H) 70 - 99 mg/dL  Glucose, capillary  Result Value Ref Range   Glucose-Capillary 143 (H) 70 - 99 mg/dL  Glucose, capillary  Result Value Ref Range   Glucose-Capillary 149 (H) 70 - 99 mg/dL  Glucose, capillary  Result Value Ref Range   Glucose-Capillary 140 (H) 70 - 99 mg/dL  Glucose, capillary  Result Value Ref Range   Glucose-Capillary 154 (H) 70 - 99 mg/dL  ECHOCARDIOGRAM COMPLETE  Result Value Ref Range   Weight 4,204.61 oz   Height 66 in   BP 123/61 mmHg  Troponin I (High Sensitivity)  Result Value Ref Range   Troponin I (High Sensitivity) 17 <18 ng/L    Discharge Medications:   Allergies as of 06/14/2019      Reactions   [place] Other (See Comments)   Gets her "high"      Medication List    TAKE these medications   alendronate 70 MG tablet Commonly known as: FOSAMAX Take 70 mg by mouth every 7 (seven) days. Take with a full glass of water on an empty stomach.   allopurinol 100 MG tablet Commonly known as: ZYLOPRIM Take 100 mg by mouth daily after breakfast.   aspirin 325 MG tablet Take 1 tablet (  325 mg total) by mouth daily.   diphenhydramine-acetaminophen 25-500 MG Tabs tablet Commonly known as: TYLENOL PM Take 1 tablet by mouth at bedtime as needed (sleep).   docusate sodium 100 MG capsule Commonly known as: COLACE Take 1 capsule (100 mg total) by mouth 2 (two) times daily.   ferrous sulfate 325 (65 FE) MG tablet Take 325 mg by mouth daily with breakfast.   hydrochlorothiazide 25 MG tablet Commonly  known as: HYDRODIURIL Take 25 mg by mouth daily after breakfast.   irbesartan 300 MG tablet Commonly known as: AVAPRO Take 300 mg by mouth daily after breakfast.   ketoconazole 2 % cream Commonly known as: NIZORAL Apply 1 application topically 2 (two) times daily as needed for irritation.   oxyCODONE-acetaminophen 5-325 MG tablet Commonly known as: PERCOCET/ROXICET Take 1 tablet by mouth every 4 (four) hours as needed for severe pain.   pioglitazone 15 MG tablet Commonly known as: ACTOS Take 15 mg by mouth daily after breakfast.   PRESERVISION AREDS 2 PO Take 1 capsule by mouth 2 (two) times daily.   sitaGLIPtin 100 MG tablet Commonly known as: JANUVIA Take 100 mg by mouth daily after breakfast.   Synthroid 137 MCG tablet Generic drug: levothyroxine Take 137 mcg by mouth daily before breakfast.   timolol 0.5 % ophthalmic solution Commonly known as: TIMOPTIC Place 1 drop into both eyes 2 (two) times daily.   Vitamin D3 25 MCG tablet Commonly known as: Vitamin D Take 5 tablets (5,000 Units total) by mouth daily. What changed:   medication strength  how much to take       Diagnostic Studies: Dg Knee 1-2 Views Right  Result Date: 06/11/2019 CLINICAL DATA:  ORIF of patellar fracture EXAM: RIGHT KNEE - 1-2 VIEW COMPARISON:  None. FLUOROSCOPY TIME:  Radiation Exposure Index (as provided by the fluoroscopic device): 0.24 mGy If the device does not provide the exposure index: Fluoroscopy Time:  3 seconds Number of Acquired Images:  3 FINDINGS: Right knee prosthesis is again seen. Patellar fracture has been reduced on the lateral view. No other focal abnormality is noted. IMPRESSION: Reduction of patellar fracture. Electronically Signed   By: Inez Catalina M.D.   On: 06/11/2019 16:44   Dg Chest Port 1 View  Result Date: 06/09/2019 CLINICAL DATA:  78 year old female with fall and fracture of the patella. Preop evaluation. EXAM: PORTABLE CHEST 1 VIEW COMPARISON:  Chest  radiograph dated 05/25/2015 FINDINGS: The lungs are clear. There is no pleural effusion or pneumothorax. The cardiac silhouette is within normal limits. No acute osseous pathology. IMPRESSION: No active disease. Electronically Signed   By: Anner Crete M.D.   On: 06/09/2019 22:43   Dg Knee Complete 4 Views Right  Result Date: 06/09/2019 CLINICAL DATA:  Pain. EXAM: RIGHT KNEE - COMPLETE 4+ VIEW COMPARISON:  January 04, 2012 FINDINGS: The patient is status post total knee arthroplasty on the right. The hardware appears grossly intact. There is a displace/distracted transverse fracture through the patella. There is extensive surrounding soft tissue swelling. There is a small to moderate-sized joint effusion. IMPRESSION: 1. Acute displaced transverse fracture through the patella. 2. Status post total knee arthroplasty on the right without evidence for a periprosthetic fracture. Electronically Signed   By: Constance Holster M.D.   On: 06/09/2019 21:54   Dg C-arm 1-60 Min-no Report  Result Date: 06/11/2019 Fluoroscopy was utilized by the requesting physician.  No radiographic interpretation.    Disposition: Discharge disposition: 03-Skilled [place]  Discharge Instructions    Call MD / Call 911   Complete by: As directed    If you experience chest pain or shortness of breath, CALL 911 and be transported to the hospital emergency room.  If you develope a fever above 101 F, pus (white drainage) or increased drainage or redness at the wound, or calf pain, call your surgeon's office.   Constipation Prevention   Complete by: As directed    Drink plenty of fluids.  Prune juice may be helpful.  You may use a stool softener, such as Colace (over the counter) 100 mg twice a day.  Use MiraLax (over the counter) for constipation as needed.   Diet - low sodium heart healthy   Complete by: As directed    Discharge instructions   Complete by: As directed    Keep leg splint and dressing dry.  May use water impervious bag or cast bag and tub chair to shower Tape the top of bag to skin to avoid moisture soaking the dressing on the leg.May weight bear as tolerated with the right leg brace holding the right knee in full extension, do not bend the right knee. Call if there is odor or saturation of dressing or worsening pain not controlled with medications. Call if fever greater than 101.5. Use crutches or walker no weight bearing on the ankle fracture leg. Please follow up with an appointment with Dr. Louanne Skye  2 weeks from the time of surgery. Elevate as often as possible during the first week after surgery gradually increasing the time the leg is dependent or down there after. If swelling recurrs then elevate again. Wheel chair for longer distances. Take anticoagulant daily at 6 PM.   Patellar Fracture, Adult  A patellar fracture is a break in the kneecap (patella). The patella is located on the front of the knee. A patellar fracture may make it difficult to walk. What are the causes? This condition may be caused by: A fall or a hard, direct hit (blow) to the knee. A very hard and strong bending of the knee. What increases the risk? The following factors make you more likely to experience a patellar fracture: Playing contact sports or motor sports, especially sports that involve a lot of jumping. Having bone abnormalities or diseases of the bone, such as osteoporosis or a bone tumor. Having poor strength and flexibility. Having metabolism disorders, hormone problems, or nutrition deficiencies and disorders, such as anorexia or bulimia. What are the signs or symptoms? Symptoms of this condition include: Tender and swollen knee. Pain when moving the knee, especially when straightening it. Difficulty walking or using the knee to support body weight (bearing weight). Misshapen knee, as if a bone is out of place. How is this diagnosed? This condition is diagnosed based on: Your  symptoms and medical history. A physical exam. X-rays. How is this treated? Treatment for this condition depends on the type of fracture that you have: If your patella is still in the right position after the fracture and you can still straighten your leg, you may need to wear a splint or cast for 4-6 weeks. If your patella is broken into multiple pieces but you are able to straighten your leg, you can usually be treated with a splint or cast for 4-6 weeks. In some cases, the patella may need to be removed before the cast is applied. If you cannot straighten your leg after a patellar fracture, you will need to have surgery to hold the  patella together until it heals. After surgery, a splint or cast will be applied for 4-6 weeks. You may be prescribed medicine to help relieve pain or prevent infection. Follow these instructions at home: If you have a splint: Wear the splint as told by your health care provider. Remove it only as told by your health care provider. Loosen the splint if your toes tingle, become numb, or turn cold and blue. Keep the splint clean. If the splint is not waterproof: Do not let it get wet. Cover it with a watertight covering when you take a bath or a shower. If you have a cast: Do not stick anything inside the cast to scratch your skin. Doing that increases your risk of infection. Check the skin around the cast every day. Tell your health care provider about any concerns. You may put lotion on dry skin around the edges of the cast. Do not put lotion on the skin underneath the cast. Keep the cast clean. If the cast is not waterproof: Do not let it get wet. Cover it with a watertight covering when you take a bath or a shower. Managing pain, stiffness, and swelling  If directed, put ice on the injured area: If you have a removable splint, remove it as told by your health care provider. Put ice in a plastic bag. Place a towel between your skin and the bag or between  your cast and the bag. Leave the ice on for 20 minutes, 2-3 times a day. Move your toes often to avoid stiffness and to lessen swelling. Raise (elevate) the injured area above the level of your heart while you are sitting or lying down. Medicines Take over-the-counter and prescription medicines only as told by your health care provider. If you were prescribed an antibiotic medicine, use it as told by your health care provider. Do not stop taking the antibiotic even if you start to feel better. Activity Return to your normal activities as told by your health care provider. Ask your health care provider what activities are safe for you. Do exercises as told by your health care provider. Ask your health care provider when it is safe to drive if you have a splint or cast on your leg. Do not drive or use heavy machinery while taking prescription pain medicine. General instructions Do not use the injured limb to support your body weight until your health care provider says that you can. Use crutches as told by your health care provider. Do not use any products that contain nicotine or tobacco, such as cigarettes and e-cigarettes. These can delay bone healing. If you need help quitting, ask your health care provider. Keep all follow-up visits as told by your health care provider. This is important. Contact a health care provider if: You have symptoms that get worse or do not get better after 2 weeks of treatment. You have severe, persistent pain. Get help right away if: You have redness, swelling, or increasing pain in your knee. You have a fever. You have blue or gray skin below the fracture site or in the toes. You have numbness or loss of feeling below the fracture site. This information is not intended to replace advice given to you by your health care provider. Make sure you discuss any questions you have with your health care provider. Document Released: 07/16/2003 Document Revised:  09/29/2017 Document Reviewed: 07/29/2016 Elsevier Patient Education  2020 [place] restrictions   Complete by: As directed  No driving for 9 weeks   Increase activity slowly as tolerated   Complete by: As directed    Lifting restrictions   Complete by: As directed    No lifting for 12 weeks      Follow-up Information    Jessy Oto, MD Follow up in 2 week(s).   Specialty: Orthopedic Surgery Why: For wound re-check and staple removal. Contact information: [place] [address] [phone]            Signed: [name] 06/14/2019, 8:33 AM

## 2019-06-13 NOTE — Progress Notes (Signed)
Consult progress note                                                                              Patient Demographics  Autumn Chambers, is a 78 y.o. female, DOB - April 04, 1941, HGD:[id number]  Admit date - 06/09/2019   Admitting Physician Jessy Oto, MD  Outpatient Primary MD for the patient is Kathyrn Lass, MD  Outpatient specialists:   LOS - 4  days   Medical records reviewed and are as summarized below:    Chief Complaint  Patient presents with  . Knee Pain       Brief summary   78 year old female with past medical history of diabetes mellitus and hypertension admitted by orthopedic surgery for displaced right patella fracture.  Hospitalist consulted for comanagement.  Patient overall stable.  Noted to have mild holosystolic murmur, echocardiogram ordered which revealed incidental finding of diastolic dysfunction, otherwise okay for surgery.  Patient underwent ORIF on 8/11   Assessment & Plan    Principal Problem:   Closed displaced fracture of right patella -Postop day #2, no acute complaints -Management per Ortho  Active Problems: Chronic diastolic heart failure: -Euvolemic, normal BNP, incidentally noted on echo -2D echo 8/10 showed EF of 60 to 65%, impaired relaxation   Morbid obesity:  BMI of 42.4, counseled on diet and weight control  Essential hypertension -Continue Avapro, restart HCTZ  Cardiac murmur -Stable, no acute issues  Diabetes mellitus type 2, NIDDM -Continue outpatient regimen upon discharge. -Continue sliding scale insulin while inpatient.  Code Status: Full CODE STATUS DVT Prophylaxis: SCD's Family Communication: Discussed in detail with the patient, all imaging results, lab results explained to the patient    Disposition Plan: Per primary service, orthopedics, likely SNF when bed available  Time Spent in minutes   25 minutes   Procedures:  right transverse patella frature, right total knee replacement   Consultants:     Antimicrobials:   Anti-infectives (From admission, onward)   Start     Dose/Rate Route Frequency Ordered Stop   06/11/19 1417  polymyxin B 500,000 Units, bacitracin 50,000 Units in sodium chloride 0.9 % 500 mL irrigation  Status:  Discontinued       As needed 06/11/19 1417 06/11/19 1520   06/11/19 1200  ceFAZolin (ANCEF) IVPB 2g/100 mL premix     2 g 200 mL/hr over 30 Minutes Intravenous On call to O.R. 06/10/19 2001 06/11/19 1334   06/11/19 0745  ceFAZolin (ANCEF) IVPB 2g/100 mL premix  Status:  Discontinued     2 g 200 mL/hr over 30 Minutes Intravenous On call to O.R. 06/11/19 9622 06/11/19 0742          Medications  Scheduled Meds: . allopurinol  100 mg Oral QPC breakfast  . aspirin  325 mg Oral Daily  . cholecalciferol  1,000 Units Oral Daily  . docusate sodium  100 mg Oral BID  . ferrous sulfate  325 mg Oral Q breakfast  . insulin aspart  0-15 Units Subcutaneous TID WC  . irbesartan  300 mg Oral Daily  . levothyroxine  137 mcg Oral QAC breakfast  . multivitamin  1 tablet Oral  BID  . timolol  1 drop Both Eyes BID  . traMADol  50 mg Oral Q6H   Continuous Infusions: . sodium chloride 50 mL/hr at 06/12/19 0600  . methocarbamol (ROBAXIN) IV     PRN Meds:.acetaminophen, diphenhydrAMINE **AND** acetaminophen, bisacodyl, hydrALAZINE, ketoconazole, methocarbamol **OR** methocarbamol (ROBAXIN) IV, metoCLOPramide **OR** metoCLOPramide (REGLAN) injection, oxyCODONE-acetaminophen, polyethylene glycol, sodium phosphate      Subjective:   Autumn Chambers was seen and examined today.  No acute complaints except mild constipation. Patient denies dizziness, chest pain, shortness of breath, abdominal pain, N/V. No acute events overnight.    Objective:   Vitals:   06/12/19 0409 06/12/19 0937 06/12/19 2109 06/13/19 0556  BP: 135/64 (!) 130/55 (!) 156/75 (!) 168/82  Pulse: 81 89 90 96  Resp: 14 16 15 17   Temp: 98 F (36.7 C) 97.9 F (36.6 C) 98 F (36.7  C) 98.1 F (36.7 C)  TempSrc: Oral Oral Oral Oral  SpO2: 96% 98% 98% 95%  Weight:      Height:        Intake/Output Summary (Last 24 hours) at 06/13/2019 1151 Last data filed at 06/13/2019 1000 Gross per 24 hour  Intake 690.74 ml  Output 600 ml  Net 90.74 ml     Wt Readings from Last 3 Encounters:  06/10/19 119.2 kg  03/19/18 117.9 kg  02/01/16 119.3 kg     Exam  General: Alert and oriented x 3, NAD  Eyes:  HEENT:  Atraumatic, normocephalic, normal oropharynx  Cardiovascular: S1 S2 auscultated, 2/6 holosystolic murmur,RRR  Respiratory: Clear to auscultation bilaterally, no wheezing, rales or rhonchi  Gastrointestinal: Soft, nontender, nondistended, + bowel sounds  Ext: no pedal edema bilaterally  Neuro: No new deficits, right lower extremity immobilized  Musculoskeletal: No digital cyanosis, clubbing  Skin: No rashes  Psych: NAD, pleasant and cooperative   Data Reviewed:  I have personally reviewed following labs and imaging studies  Micro Results Recent Results (from the past 240 hour(s))  SARS Coronavirus 2 [facility] order, Performed in [place] hospital lab) Nasopharyngeal Nasopharyngeal Swab     Status: None   Collection Time: 06/09/19  9:55 PM   Specimen: Nasopharyngeal Swab  Result Value Ref Range Status   SARS Coronavirus 2 NEGATIVE NEGATIVE Final    Comment: (NOTE) If result is NEGATIVE SARS-CoV-2 target nucleic acids are NOT DETECTED. The SARS-CoV-2 RNA is generally detectable in upper and lower  respiratory specimens during the acute phase of infection. The lowest  concentration of SARS-CoV-2 viral copies this assay can detect is 250  copies / mL. A negative result does not preclude SARS-CoV-2 infection  and should not be used as the sole basis for treatment or other  patient management decisions.  A negative result may occur with  improper specimen collection / handling, submission of specimen other  than nasopharyngeal swab, presence  of viral mutation(s) within the  areas targeted by this assay, and inadequate number of viral copies  (<250 copies / mL). A negative result must be combined with clinical  observations, patient history, and epidemiological information. If result is POSITIVE SARS-CoV-2 target nucleic acids are DETECTED. The SARS-CoV-2 RNA is generally detectable in upper and lower  respiratory specimens dur ing the acute phase of infection.  Positive  results are indicative of active infection with SARS-CoV-2.  Clinical  correlation with patient history and other diagnostic information is  necessary to determine patient infection status.  Positive results do  not rule out bacterial infection or co-infection with other  viruses. If result is PRESUMPTIVE POSTIVE SARS-CoV-2 nucleic acids MAY BE PRESENT.   A presumptive positive result was obtained on the submitted specimen  and confirmed on repeat testing.  While 2019 novel coronavirus  (SARS-CoV-2) nucleic acids may be present in the submitted sample  additional confirmatory testing may be necessary for epidemiological  and / or clinical management purposes  to differentiate between  SARS-CoV-2 and other Sarbecovirus currently known to infect humans.  If clinically indicated additional testing with an alternate test  methodology [phone]) is advised. The SARS-CoV-2 RNA is generally  detectable in upper and lower respiratory sp ecimens during the acute  phase of infection. The expected result is Negative. Fact Sheet for Patients:  StrictlyIdeas.no Fact Sheet for Healthcare Providers: BankingDealers.co.za This test is not yet approved or cleared by the [place] FDA and has been authorized for detection and/or diagnosis of SARS-CoV-2 by FDA under an Emergency Use Authorization (EUA).  This EUA will remain in effect (meaning this test can be used) for the duration of the COVID-19 declaration under Section  564(b)(1) of the Act, 21 U.S.C. section 360bbb-3(b)(1), unless the authorization is terminated or revoked sooner. Performed at [facility], [place] [address] [place], [place] [address]   Surgical pcr screen     Status: Abnormal   Collection Time: 06/10/19 10:32 PM   Specimen: Nasal Mucosa; Nasal Swab  Result Value Ref Range Status   MRSA, PCR NEGATIVE NEGATIVE Final   Staphylococcus aureus POSITIVE (A) NEGATIVE Final    Comment: (NOTE) The Xpert SA Assay (FDA approved for NASAL specimens in patients 40 years of age and older), is one component of a comprehensive surveillance program. It is not intended to diagnose infection nor to guide or monitor treatment. Performed at [facility], [place] [address] [place], [place] [address]     Radiology Reports Dg Knee 1-2 Views Right  Result Date: 06/11/2019 CLINICAL DATA:  ORIF of patellar fracture EXAM: RIGHT KNEE - 1-2 VIEW COMPARISON:  None. FLUOROSCOPY TIME:  Radiation Exposure Index (as provided by the fluoroscopic device): 0.24 mGy If the device does not provide the exposure index: Fluoroscopy Time:  3 seconds Number of Acquired Images:  3 FINDINGS: Right knee prosthesis is again seen. Patellar fracture has been reduced on the lateral view. No other focal abnormality is noted. IMPRESSION: Reduction of patellar fracture. Electronically Signed   By: Inez Catalina M.D.   On: 06/11/2019 16:44   Dg Chest Port 1 View  Result Date: 06/09/2019 CLINICAL DATA:  79 year old female with fall and fracture of the patella. Preop evaluation. EXAM: PORTABLE CHEST 1 VIEW COMPARISON:  Chest radiograph dated 05/25/2015 FINDINGS: The lungs are clear. There is no pleural effusion or pneumothorax. The cardiac silhouette is within normal limits. No acute osseous pathology. IMPRESSION: No active disease. Electronically Signed   By: Anner Crete M.D.   On: 06/09/2019 22:43   Dg Knee Complete 4 Views Right  Result Date:  06/09/2019 CLINICAL DATA:  Pain. EXAM: RIGHT KNEE - COMPLETE 4+ VIEW COMPARISON:  January 04, 2012 FINDINGS: The patient is status post total knee arthroplasty on the right. The hardware appears grossly intact. There is a displace/distracted transverse fracture through the patella. There is extensive surrounding soft tissue swelling. There is a small to moderate-sized joint effusion. IMPRESSION: 1. Acute displaced transverse fracture through the patella. 2. Status post total knee arthroplasty on the right without evidence for a periprosthetic fracture. Electronically Signed   By: Jamie Kato.D.  On: 06/09/2019 21:54   Dg C-arm 1-60 Min-no Report  Result Date: 06/11/2019 Fluoroscopy was utilized by the requesting physician.  No radiographic interpretation.    Lab Data:  CBC: Recent Labs  Lab 06/09/19 2153 06/10/19 0246 06/11/19 0251 06/12/19 0308 06/13/19 0559  WBC 12.0* 9.9 6.1 11.4* 8.9  NEUTROABS  --   --   --  9.5*  --   HGB 11.2* 10.6* 10.3* 9.5* 9.5*  HCT 35.3* 33.6* 32.9* 30.4* 30.6*  MCV 97.5 97.4 98.8 98.1 99.4  PLT 253 253 213 203 176   Basic Metabolic Panel: Recent Labs  Lab 06/09/19 2153 06/10/19 0246 06/11/19 0251 06/12/19 0308  NA 137 136 138 137  K 4.7 4.4 4.3 5.0  CL 108 106 108 108  CO2 19* 20* 21* 21*  GLUCOSE 211* 175* 153* 213*  BUN 29* 27* 28* 27*  CREATININE 1.25* 1.25* 1.30* 1.24*  CALCIUM 9.6 9.1 9.1 8.8*   GFR: Estimated Creatinine Clearance: 49.2 mL/min (A) (by C-G formula based on SCr of 1.24 mg/dL (H)). Liver Function Tests: Recent Labs  Lab 06/09/19 2153  AST 18  ALT 18  ALKPHOS 74  BILITOT 0.6  PROT 7.6  ALBUMIN 3.7   No results for input(s): LIPASE, AMYLASE in the last 168 hours. No results for input(s): AMMONIA in the last 168 hours. Coagulation Profile: Recent Labs  Lab 06/11/19 0251  INR 1.1   Cardiac Enzymes: No results for input(s): CKTOTAL, CKMB, CKMBINDEX, TROPONINI in the last 168 hours. BNP (last 3 results)  No results for input(s): PROBNP in the last 8760 hours. HbA1C: No results for input(s): HGBA1C in the last 72 hours. CBG: Recent Labs  Lab 06/12/19 1140 06/12/19 1705 06/12/19 2112 06/13/19 0743 06/13/19 1127  GLUCAP 145* 136* 150* 142* 143*   Lipid Profile: No results for input(s): CHOL, HDL, LDLCALC, TRIG, CHOLHDL, LDLDIRECT in the last 72 hours. Thyroid Function Tests: No results for input(s): TSH, T4TOTAL, FREET4, T3FREE, THYROIDAB in the last 72 hours. Anemia Panel: No results for input(s): VITAMINB12, FOLATE, FERRITIN, TIBC, IRON, RETICCTPCT in the last 72 hours. Urine analysis:    Component Value Date/Time   COLORURINE YELLOW 05/26/2008 1453   APPEARANCEUR CLOUDY (A) 05/26/2008 1453   LABSPEC 1.017 05/26/2008 1453   PHURINE 6.0 05/26/2008 1453   GLUCOSEU NEGATIVE 05/26/2008 1453   HGBUR NEGATIVE 05/26/2008 1453   BILIRUBINUR NEGATIVE 05/26/2008 1453   KETONESUR NEGATIVE 05/26/2008 1453   PROTEINUR NEGATIVE 05/26/2008 1453   UROBILINOGEN 0.2 05/26/2008 1453   NITRITE NEGATIVE 05/26/2008 1453   LEUKOCYTESUR LARGE (A) 05/26/2008 1453     Ripudeep Rai M.D. [facility] 06/13/2019, 11:51 AM  Pager: [phone] Between 7am to 7pm - call Pager - [phone]  After 7pm go to [email or web address] - password [email or web address]  Call night coverage person covering after 7pm

## 2019-06-13 NOTE — TOC Transition Note (Signed)
Transition of Care [facility]) - CM/SW Discharge Note   Patient Details  Name: Autumn Chambers MRN: [id number] Date of Birth: 1940/11/10  Transition of Care [facility]) CM/SW Contact:  Lia Hopping, [place] Phone Number: 06/13/2019, 3:26 PM   Clinical Narrative:    CSW met with the patient and friend at bedside. Patient is agreeable to [facility].  CSW sent message to physician about the patient discharge barriers at this time. -Patient will need a thirty day note sign for PASRR approval/ COVID-19 test    Final next level of care: Skilled Nursing Facility Barriers to Discharge: Awaiting State Approval (PASRR)(COVID-19 Test results)   Patient Goals and CMS Choice Patient states their goals for this hospitalization and ongoing recovery are:: to get better CMS Medicare.gov Compare Post Acute Care list provided to:: Patient Choice offered to / list presented to : Patient  Discharge Placement  SNF                     Discharge Plan and Services In-house Referral: Clinical Social Work                                   Social Determinants of Health ([place]) Interventions     Readmission Risk Interventions No flowsheet data found.

## 2019-06-13 NOTE — Progress Notes (Signed)
Physical Therapy Treatment Patient Details Name: Autumn Chambers MRN: [id number] DOB: 17-Nov-1940 Today's Date: 06/13/2019    History of Present Illness RAMON BRANT is a 78 y.o. female admitted to [facility] with a Right displaced patella fracture, transverse, displaced, closed, periprosthetic TKR, and s/p right patellar ORIF with pins, wire, cannulated screws, cerclage wire patella to tibia on 06/11/19    PT Comments    Pt got out of bed today with max encouragement.  Her fear of falling limits her more than her brace or pain. She did well when up - but needs max cues for safety.  I anticipate slow steady progress - agree with SNF POC  Follow Up Recommendations  SNF     Equipment Recommendations  None recommended by PT    Recommendations for Other Services       Precautions / Restrictions Precautions Precautions: Fall;Knee Precaution Comments: bledsoe brace at all times.  no flexion Required Braces or Orthoses: Other Brace Other Brace: bledsoe brace in full extension Restrictions Weight Bearing Restrictions: No RLE Weight Bearing: Weight bearing as tolerated    Mobility  Bed Mobility Overal bed mobility: Needs Assistance Bed Mobility: Supine to Sit     Supine to sit: Mod assist     General bed mobility comments: verbal cues for technique, assist required for R LE over bed and scooting to EOB;  Transfers Overall transfer level: Needs assistance Equipment used: Rolling walker (2 wheeled)             General transfer comment: pt needed mod assist and max cues for hand placement. p t needed assist to move right leg out in front of her - before sitting  Ambulation/Gait Ambulation/Gait assistance: Min assist Gait Distance (Feet): 5 Feet Assistive device: Rolling walker (2 wheeled) Gait Pattern/deviations: Step-to pattern;Trunk flexed;Decreased stride length;Decreased stance time - right;Wide base of support     General Gait Details: pt got up to [facility] wtih RW -  unable to move her feet and afraid of falling.  pt then stood and walked 5 feet to the chiar and turned and sat. she needed lots of cues for safety and encouragement   Stairs             Wheelchair Mobility    Modified Rankin (Stroke Patients Only)       Balance Overall balance assessment: History of Falls;Needs assistance     Sitting balance - Comments: able to sit EOB without UE support                                    Cognition Arousal/Alertness: Awake/alert Behavior During Therapy: WFL for tasks assessed/performed Overall Cognitive Status: Within Functional Limits for tasks assessed                                 General Comments: pt very fearful of moving around.  when i talked to her - it wasnt the knee pain but the fear of falling that makes her not want to move at all. she needed max encouragment to get up to [facility] and end up in chiar      Exercises      General Comments General comments (skin integrity, edema, etc.): bledsoe brace on throughout treatment.  pt educated on WBAT      Pertinent Vitals/Pain Pain Assessment: 0-10 Pain Score: 4  Pain Location: right knee with activity Pain Descriptors / Indicators: Aching;Burning;Discomfort;Sore Pain Intervention(s): Repositioned;Limited activity within patient's tolerance;Monitored during session    Home Living                      Prior Function            PT Goals (current goals can now be found in the care plan section) Progress towards PT goals: Progressing toward goals    Frequency    Min 3X/week      PT Plan Current plan remains appropriate    Co-evaluation              AM-PAC PT "6 Clicks" Mobility   Outcome Measure  Help needed turning from your back to your side while in a flat bed without using bedrails?: A Lot Help needed moving from lying on your back to sitting on the side of a flat bed without using bedrails?: A Lot Help needed  moving to and from a bed to a chair (including a wheelchair)?: A Lot Help needed standing up from a chair using your arms (e.g., wheelchair or bedside chair)?: A Lot Help needed to walk in hospital room?: A Lot Help needed climbing 3-5 steps with a railing? : Total 6 Click Score: 11    End of Session Equipment Utilized During Treatment: Gait belt Activity Tolerance: Other (comment)(pt limited by fear of falling) Patient left: in chair;with chair alarm set;with call bell/phone within reach Nurse Communication: Mobility status;Weight bearing status PT Visit Diagnosis: Other abnormalities of gait and mobility (R26.89);History of falling (Z91.81)     Time: 6195-0932 PT Time Calculation (min) (ACUTE ONLY): 30 min  Charges:  $Gait Training: 8-22 mins $Therapeutic Activity: 8-22 mins                    06/13/2019   Rande Lawman, PT    Loyal Buba 06/13/2019, 4:06 PM

## 2019-06-13 NOTE — Progress Notes (Signed)
     Subjective: 2 Days Post-Op Procedure(s) (LRB): OPEN REDUCTION INTERNAL (ORIF) FIXATION RIGHT PATELLA FRACTURE WITH PINS, WIRE, CANNULATED SCREWS, CERCLAGE WIRE PATELLA TO TIBIA (Right)Awake,alert and oriented x 4. Hard of hearing but much better with hearing aids in place. Knows she is going to [facility] tomorrow. FL-2 form attested, Verification form for need for SNF signed. Voiding without difficulty.   Patient reports pain as moderate.    Objective:   VITALS:  Temp:  [97.9 F (36.6 C)-99.3 F (37.4 C)] 99.3 F (37.4 C) (08/13 2243) Pulse Rate:  [87-106] 106 (08/13 2243) Resp:  [16-18] 18 (08/13 2243) BP: (155-168)/(72-82) 155/73 (08/13 2243) SpO2:  [95 %-98 %] 98 % (08/13 2243)  Neurologically intact ABD soft Neurovascular intact Sensation intact distally Intact pulses distally Dorsiflexion/Plantar flexion intact Incision: scant drainage No cellulitis present Compartment soft Dressing changed to Aquacell dressing. Incision painted with betadiene. Large leg will not be able to use TED hose.   LABS Recent Labs    06/11/19 0251 06/12/19 0308 06/13/19 0559  HGB 10.3* 9.5* 9.5*  WBC 6.1 11.4* 8.9  PLT 213 203 214   Recent Labs    06/11/19 0251 06/12/19 0308  NA 138 137  K 4.3 5.0  CL 108 108  CO2 21* 21*  BUN 28* 27*  CREATININE 1.30* 1.24*  GLUCOSE 153* 213*   Recent Labs    06/11/19 0251  INR 1.1     Assessment/Plan: 2 Days Post-Op Procedure(s) (LRB): OPEN REDUCTION INTERNAL (ORIF) FIXATION RIGHT PATELLA FRACTURE WITH PINS, WIRE, CANNULATED SCREWS, CERCLAGE WIRE PATELLA TO TIBIA (Right)  Advance diet Up with therapy D/C IV fluids Plan for discharge tomorrow Discharge to SNF  [name] 06/13/2019, 10:51 PMPatient ID: Autumn Chambers, female   DOB: [birth year], 78 y.o.   MRN: [id number]

## 2019-06-14 DIAGNOSIS — M109 Gout, unspecified: Secondary | ICD-10-CM | POA: Diagnosis not present

## 2019-06-14 DIAGNOSIS — M150 Primary generalized (osteo)arthritis: Secondary | ICD-10-CM | POA: Diagnosis not present

## 2019-06-14 DIAGNOSIS — E559 Vitamin D deficiency, unspecified: Secondary | ICD-10-CM | POA: Diagnosis not present

## 2019-06-14 DIAGNOSIS — R21 Rash and other nonspecific skin eruption: Secondary | ICD-10-CM | POA: Diagnosis not present

## 2019-06-14 DIAGNOSIS — R0600 Dyspnea, unspecified: Secondary | ICD-10-CM | POA: Diagnosis not present

## 2019-06-14 DIAGNOSIS — S82034D Nondisplaced transverse fracture of right patella, subsequent encounter for closed fracture with routine healing: Secondary | ICD-10-CM | POA: Diagnosis not present

## 2019-06-14 DIAGNOSIS — R262 Difficulty in walking, not elsewhere classified: Secondary | ICD-10-CM | POA: Diagnosis not present

## 2019-06-14 DIAGNOSIS — K581 Irritable bowel syndrome with constipation: Secondary | ICD-10-CM | POA: Diagnosis not present

## 2019-06-14 DIAGNOSIS — Z03818 Encounter for observation for suspected exposure to other biological agents ruled out: Secondary | ICD-10-CM | POA: Diagnosis not present

## 2019-06-14 DIAGNOSIS — E118 Type 2 diabetes mellitus with unspecified complications: Secondary | ICD-10-CM | POA: Diagnosis not present

## 2019-06-14 DIAGNOSIS — Z111 Encounter for screening for respiratory tuberculosis: Secondary | ICD-10-CM | POA: Diagnosis not present

## 2019-06-14 DIAGNOSIS — K5900 Constipation, unspecified: Secondary | ICD-10-CM | POA: Diagnosis not present

## 2019-06-14 DIAGNOSIS — M1990 Unspecified osteoarthritis, unspecified site: Secondary | ICD-10-CM | POA: Diagnosis not present

## 2019-06-14 DIAGNOSIS — N189 Chronic kidney disease, unspecified: Secondary | ICD-10-CM | POA: Diagnosis not present

## 2019-06-14 DIAGNOSIS — E119 Type 2 diabetes mellitus without complications: Secondary | ICD-10-CM | POA: Diagnosis not present

## 2019-06-14 DIAGNOSIS — I10 Essential (primary) hypertension: Secondary | ICD-10-CM | POA: Diagnosis not present

## 2019-06-14 DIAGNOSIS — G8929 Other chronic pain: Secondary | ICD-10-CM | POA: Diagnosis not present

## 2019-06-14 DIAGNOSIS — M1991 Primary osteoarthritis, unspecified site: Secondary | ICD-10-CM | POA: Diagnosis not present

## 2019-06-14 DIAGNOSIS — H409 Unspecified glaucoma: Secondary | ICD-10-CM | POA: Diagnosis not present

## 2019-06-14 DIAGNOSIS — F419 Anxiety disorder, unspecified: Secondary | ICD-10-CM | POA: Diagnosis not present

## 2019-06-14 DIAGNOSIS — E785 Hyperlipidemia, unspecified: Secondary | ICD-10-CM | POA: Diagnosis not present

## 2019-06-14 DIAGNOSIS — M2550 Pain in unspecified joint: Secondary | ICD-10-CM | POA: Diagnosis not present

## 2019-06-14 DIAGNOSIS — M6281 Muscle weakness (generalized): Secondary | ICD-10-CM | POA: Diagnosis not present

## 2019-06-14 DIAGNOSIS — S82091D Other fracture of right patella, subsequent encounter for closed fracture with routine healing: Secondary | ICD-10-CM | POA: Diagnosis not present

## 2019-06-14 DIAGNOSIS — S82034A Nondisplaced transverse fracture of right patella, initial encounter for closed fracture: Secondary | ICD-10-CM | POA: Diagnosis not present

## 2019-06-14 DIAGNOSIS — G8911 Acute pain due to trauma: Secondary | ICD-10-CM | POA: Diagnosis not present

## 2019-06-14 DIAGNOSIS — Z1159 Encounter for screening for other viral diseases: Secondary | ICD-10-CM | POA: Diagnosis not present

## 2019-06-14 DIAGNOSIS — M810 Age-related osteoporosis without current pathological fracture: Secondary | ICD-10-CM | POA: Diagnosis not present

## 2019-06-14 DIAGNOSIS — R52 Pain, unspecified: Secondary | ICD-10-CM | POA: Diagnosis not present

## 2019-06-14 DIAGNOSIS — E039 Hypothyroidism, unspecified: Secondary | ICD-10-CM | POA: Diagnosis not present

## 2019-06-14 DIAGNOSIS — H401290 Low-tension glaucoma, unspecified eye, stage unspecified: Secondary | ICD-10-CM | POA: Diagnosis not present

## 2019-06-14 DIAGNOSIS — Z7401 Bed confinement status: Secondary | ICD-10-CM | POA: Diagnosis not present

## 2019-06-14 DIAGNOSIS — R0902 Hypoxemia: Secondary | ICD-10-CM | POA: Diagnosis not present

## 2019-06-14 DIAGNOSIS — M1000 Idiopathic gout, unspecified site: Secondary | ICD-10-CM | POA: Diagnosis not present

## 2019-06-14 DIAGNOSIS — D649 Anemia, unspecified: Secondary | ICD-10-CM | POA: Diagnosis not present

## 2019-06-14 DIAGNOSIS — M8000XD Age-related osteoporosis with current pathological fracture, unspecified site, subsequent encounter for fracture with routine healing: Secondary | ICD-10-CM | POA: Diagnosis not present

## 2019-06-14 LAB — GLUCOSE, CAPILLARY
Glucose-Capillary: 154 mg/dL — ABNORMAL HIGH (ref 70–99)
Glucose-Capillary: 136 mg/dL — ABNORMAL HIGH (ref 70–99)

## 2019-06-14 LAB — SARS CORONAVIRUS 2 (TAT 6-24 HRS): SARS Coronavirus 2: NEGATIVE

## 2019-06-14 MED ORDER — ASPIRIN 325 MG PO TABS: 325.0000 mg | ORAL_TABLET | Freq: Every day | ORAL | 1 refills | Status: AC

## 2019-06-14 MED ORDER — VITAMIN D 25 MCG (1000 UNIT) PO TABS
5000.0000 [IU] | ORAL_TABLET | Freq: Every day | ORAL | Status: DC
  Administered 2019-06-14: 10:00:00 5000 [IU] via ORAL
  Filled 2019-06-14 (×2): qty 5

## 2019-06-14 MED ORDER — VITAMIN D3 25 MCG PO TABS
5000.0000 [IU] | ORAL_TABLET | Freq: Every day | ORAL | Status: DC
  Filled 2019-06-14: qty 5

## 2019-06-14 MED ORDER — DOCUSATE SODIUM 100 MG PO CAPS: 100.0000 mg | ORAL_CAPSULE | Freq: Two times a day (BID) | ORAL | 0 refills | Status: AC

## 2019-06-14 MED ORDER — VITAMIN D3 25 MCG PO TABS: 5000.0000 [IU] | ORAL_TABLET | Freq: Every day | ORAL | Status: DC

## 2019-06-14 MED ORDER — OXYCODONE-ACETAMINOPHEN 5-325 MG PO TABS: 1.0000 | ORAL_TABLET | ORAL | 0 refills | Status: DC | PRN

## 2019-06-14 MED ORDER — VITAMIN D3 25 MCG PO TABS: 5000.0000 [IU] | ORAL_TABLET | Freq: Every day | ORAL | 2 refills | Status: AC

## 2019-06-14 NOTE — TOC Progression Note (Signed)
Transition of Care [facility]) - Progression Note    Patient Details  Name: Autumn Chambers MRN: [id number] Date of Birth: July 29, 1941  Transition of Care [facility]) CM/SW Contact  Leeroy Cha, RN Phone Number: 06/14/2019, 9:52 AM  Clinical Narrative:    30 days passar signed and faxed to [place] must .   Expected Discharge Plan: [place] Barriers to Discharge: [place] (PASRR)(COVID-19 Test results)  Expected Discharge Plan and Services Expected Discharge Plan: [place] In-house Referral: Clinical Social Work     Living arrangements for the past 2 months: Single Family Home Expected Discharge Date: 06/14/19                                     Social Determinants of Health (SDOH) Interventions    Readmission Risk Interventions No flowsheet data found.

## 2019-06-14 NOTE — Progress Notes (Signed)
     Subjective: 3 Days Post-Op Procedure(s) (LRB): OPEN REDUCTION INTERNAL (ORIF) FIXATION RIGHT PATELLA FRACTURE WITH PINS, WIRE, CANNULATED SCREWS, CERCLAGE WIRE PATELLA TO TIBIA (Right) Awake, alert and hard of hearing, needs her hearing aids tobe able to hear. Voiding without difficulty, tolerating po narcotics and po nourishment. Patient reports pain as mild.    Objective:   VITALS:  Temp:  [97.9 F (36.6 C)-99.3 F (37.4 C)] 98.7 F (37.1 C) (08/14 0623) Pulse Rate:  [87-106] 106 (08/14 0623) Resp:  [16-18] 16 (08/14 0623) BP: (146-156)/(72-76) 146/76 (08/14 0623) SpO2:  [95 %-98 %] 95 % (08/14 2620)  Neurologically intact ABD soft Neurovascular intact Sensation intact distally Intact pulses distally Dorsiflexion/Plantar flexion intact Incision: scant drainage and aquacell dressing will stay in place for 2 weeks, small scant drainage is likely the betadiene heavily painted last evening. No cellulitis present Compartment soft   LABS Recent Labs    06/12/19 0308 06/13/19 0559  HGB 9.5* 9.5*  WBC 11.4* 8.9  PLT 203 214   Recent Labs    06/12/19 0308  NA 137  K 5.0  CL 108  CO2 21*  BUN 27*  CREATININE 1.24*  GLUCOSE 213*   No results for input(s): LABPT, INR in the last 72 hours.   Assessment/Plan: 3 Days Post-Op Procedure(s) (LRB): OPEN REDUCTION INTERNAL (ORIF) FIXATION RIGHT PATELLA FRACTURE WITH PINS, WIRE, CANNULATED SCREWS, CERCLAGE WIRE PATELLA TO TIBIA (Right)  Advance diet Up with therapy Discharge to SNF  [name] 06/14/2019, 8:13 AMPatient ID: Autumn Chambers, female   DOB: [birth year], 78 y.o.   MRN: [id number]

## 2019-06-14 NOTE — Progress Notes (Signed)
Called report to receiving nurse at [facility] tele [phone]. RN denies any questions at this time  Patient alert oriented and vs stable.  Denies pain.  Pt has hearing  aides in her ears and case is in her purse.

## 2019-06-14 NOTE — TOC Progression Note (Addendum)
Transition of Care [facility]) - Progression Note    Patient Details  Name: Autumn Chambers MRN: [id number] Date of Birth: [birth year]  Transition of Care [facility]) CM/SW Contact  Leeroy Cha, RN Phone Number: 06/14/2019, 12:18 PM  Clinical Narrative:    tct-Dr. Levy Pupa aware that patient has bed at [facility] and need of dc summary. Patient dcd to [facility] called at 1358.  Expected Discharge Plan: Skilled Nursing Facility Barriers to Discharge: [facility] (PASRR)(COVID-19 Test results)  Expected Discharge Plan and Services Expected Discharge Plan: [place] In-house Referral: Clinical Social Work     Living arrangements for the past 2 months: Single Family Home Expected Discharge Date: 06/14/19                                     Social Determinants of Health (SDOH) Interventions    Readmission Risk Interventions No flowsheet data found.

## 2019-06-14 NOTE — Progress Notes (Signed)
Consult progress note                                                                              Patient Demographics  Autumn Chambers, is a 78 y.o. female, DOB - 1941-09-11, NTI:[id number]  Admit date - 06/09/2019   Admitting Physician Jessy Oto, MD  Outpatient Primary MD for the patient is Kathyrn Lass, MD  Outpatient specialists:   LOS - 5  days   Medical records reviewed and are as summarized below:    Chief Complaint  Patient presents with  . Knee Pain       Brief summary   78 year old female with past medical history of diabetes mellitus and hypertension admitted by orthopedic surgery for displaced right patella fracture.  Hospitalist consulted for comanagement.  Patient overall stable.  Noted to have mild holosystolic murmur, echocardiogram ordered which revealed incidental finding of diastolic dysfunction, otherwise okay for surgery.  Patient underwent ORIF on 8/11   Assessment & Plan    Principal Problem:   Closed displaced fracture of right patella -Postop day #3, no acute complaints -Management per Ortho  Active Problems: Chronic diastolic heart failure: -Euvolemic, normal BNP, incidentally noted on echo -2D echo 8/10 showed EF of 60 to 65%, impaired relaxation   Morbid obesity:  BMI of 42.4, counseled on diet and weight control  Essential hypertension -Cont Avapro, HCTZ  Cardiac murmur -Stable, no acute issues  Diabetes mellitus type 2, NIDDM -Continue outpatient regimen upon discharge.  Constipation: Resolved   Code Status: Full CODE STATUS DVT Prophylaxis: SCD's Family Communication: Discussed in detail with the patient, all imaging results, lab results explained to the patient    Disposition Plan: Per primary service, orthopedics, likely SNF when bed available. Cleared from medical stand point. I will sign off. Please call if any questions  Time Spent in minutes   15 minutes   Procedures:  right transverse patella  frature, right total knee replacement  Consultants:     Antimicrobials:   Anti-infectives (From admission, onward)   Start     Dose/Rate Route Frequency Ordered Stop   06/11/19 1417  polymyxin B 500,000 Units, bacitracin 50,000 Units in sodium chloride 0.9 % 500 mL irrigation  Status:  Discontinued       As needed 06/11/19 1417 06/11/19 1520   06/11/19 1200  ceFAZolin (ANCEF) IVPB 2g/100 mL premix     2 g 200 mL/hr over 30 Minutes Intravenous On call to O.R. 06/10/19 2001 06/11/19 1334   06/11/19 0745  ceFAZolin (ANCEF) IVPB 2g/100 mL premix  Status:  Discontinued     2 g 200 mL/hr over 30 Minutes Intravenous On call to O.R. 06/11/19 8676 06/11/19 0742         Medications  Scheduled Meds: . allopurinol  100 mg Oral QPC breakfast  . aspirin  325 mg Oral Daily  . cholecalciferol  5,000 Units Oral Daily  . docusate sodium  100 mg Oral BID  . ferrous sulfate  325 mg Oral Q breakfast  . hydrochlorothiazide  25 mg Oral QPC breakfast  . insulin aspart  0-15 Units Subcutaneous TID WC  . irbesartan  300  mg Oral Daily  . levothyroxine  137 mcg Oral QAC breakfast  . multivitamin  1 tablet Oral BID  . timolol  1 drop Both Eyes BID  . traMADol  50 mg Oral Q6H  . cholecalciferol  5,000 Units Oral Daily   Continuous Infusions: . sodium chloride Stopped (06/12/19 1650)  . methocarbamol (ROBAXIN) IV     PRN Meds:.acetaminophen, diphenhydrAMINE **AND** acetaminophen, bisacodyl, hydrALAZINE, ketoconazole, methocarbamol **OR** methocarbamol (ROBAXIN) IV, metoCLOPramide **OR** metoCLOPramide (REGLAN) injection, oxyCODONE-acetaminophen, polyethylene glycol, sodium phosphate      Subjective:   Autumn Chambers was seen and examined today. States she was constipated before for 3 days, now it is resolved, had multiple BM's. Patient denies dizziness, chest pain, shortness of breath, abdominal pain, N/V. No acute events overnight.    Objective:   Vitals:   06/13/19 0556 06/13/19 1347  06/13/19 2243 06/14/19 0623  BP: (!) 168/82 (!) 156/72 (!) 155/73 (!) 146/76  Pulse: 96 87 (!) 106 (!) 106  Resp: 17 16 18 16   Temp: 98.1 F (36.7 C) 97.9 F (36.6 C) 99.3 F (37.4 C) 98.7 F (37.1 C)  TempSrc: Oral Oral Oral Oral  SpO2: 95% 96% 98% 95%  Weight:      Height:        Intake/Output Summary (Last 24 hours) at 06/14/2019 0915 Last data filed at 06/14/2019 4431 Gross per 24 hour  Intake 1407.46 ml  Output 1275 ml  Net 132.46 ml     Wt Readings from Last 3 Encounters:  06/10/19 119.2 kg  03/19/18 117.9 kg  02/01/16 119.3 kg    Physical Exam  General: Alert and oriented x 3, NAD  Eyes:  HEENT:    Cardiovascular: S1 S2 clear, RRR. No pedal edema b/l  Respiratory: CTAB, no wheezing, rales or rhonchi  Gastrointestinal: Soft, nontender, nondistended, NBS  Ext: no pedal edema bilaterally, RLE imobilized  Neuro: no new deficits  Musculoskeletal: No cyanosis, clubbing  Skin: No rashes  Psych: Normal affect and demeanor, alert and oriented x3    Data Reviewed:  I have personally reviewed following labs and imaging studies  Micro Results Recent Results (from the past 240 hour(s))  SARS Coronavirus 2 [facility] order, Performed in [facility] hospital lab) Nasopharyngeal Nasopharyngeal Swab     Status: None   Collection Time: 06/09/19  9:55 PM   Specimen: Nasopharyngeal Swab  Result Value Ref Range Status   SARS Coronavirus 2 NEGATIVE NEGATIVE Final    Comment: (NOTE) If result is NEGATIVE SARS-CoV-2 target nucleic acids are NOT DETECTED. The SARS-CoV-2 RNA is generally detectable in upper and lower  respiratory specimens during the acute phase of infection. The lowest  concentration of SARS-CoV-2 viral copies this assay can detect is 250  copies / mL. A negative result does not preclude SARS-CoV-2 infection  and should not be used as the sole basis for treatment or other  patient management decisions.  A negative result may occur with  improper  specimen collection / handling, submission of specimen other  than nasopharyngeal swab, presence of viral mutation(s) within the  areas targeted by this assay, and inadequate number of viral copies  (<250 copies / mL). A negative result must be combined with clinical  observations, patient history, and epidemiological information. If result is POSITIVE SARS-CoV-2 target nucleic acids are DETECTED. The SARS-CoV-2 RNA is generally detectable in upper and lower  respiratory specimens dur ing the acute phase of infection.  Positive  results are indicative of active infection with SARS-CoV-2.  Clinical  correlation with patient history and other diagnostic information is  necessary to determine patient infection status.  Positive results do  not rule out bacterial infection or co-infection with other viruses. If result is PRESUMPTIVE POSTIVE SARS-CoV-2 nucleic acids MAY BE PRESENT.   A presumptive positive result was obtained on the submitted specimen  and confirmed on repeat testing.  While 2019 novel coronavirus  (SARS-CoV-2) nucleic acids may be present in the submitted sample  additional confirmatory testing may be necessary for epidemiological  and / or clinical management purposes  to differentiate between  SARS-CoV-2 and other Sarbecovirus currently known to infect humans.  If clinically indicated additional testing with an alternate test  methodology [phone]) is advised. The SARS-CoV-2 RNA is generally  detectable in upper and lower respiratory sp ecimens during the acute  phase of infection. The expected result is Negative. Fact Sheet for Patients:  StrictlyIdeas.no Fact Sheet for Healthcare Providers: BankingDealers.co.za This test is not yet approved or cleared by the [place] FDA and has been authorized for detection and/or diagnosis of SARS-CoV-2 by FDA under an Emergency Use Authorization (EUA).  This EUA will remain in  effect (meaning this test can be used) for the duration of the COVID-19 declaration under Section 564(b)(1) of the Act, 21 U.S.C. section 360bbb-3(b)(1), unless the authorization is terminated or revoked sooner. Performed at [facility], [place] [address], [place], [place] [address]   Surgical pcr screen     Status: Abnormal   Collection Time: 06/10/19 10:32 PM   Specimen: Nasal Mucosa; Nasal Swab  Result Value Ref Range Status   MRSA, PCR NEGATIVE NEGATIVE Final   Staphylococcus aureus POSITIVE (A) NEGATIVE Final    Comment: (NOTE) The Xpert SA Assay (FDA approved for NASAL specimens in patients 107 years of age and older), is one component of a comprehensive surveillance program. It is not intended to diagnose infection nor to guide or monitor treatment. Performed at [facility], [place] [address] [place], [place] [address]   SARS CORONAVIRUS 2 Nasal Swab Aptima Multi Swab     Status: None   Collection Time: 06/12/19  5:03 PM   Specimen: Aptima Multi Swab; Nasal Swab  Result Value Ref Range Status   SARS Coronavirus 2 NEGATIVE NEGATIVE Final    Comment: (NOTE) SARS-CoV-2 target nucleic acids are NOT DETECTED. The SARS-CoV-2 RNA is generally detectable in upper and lower respiratory specimens during the acute phase of infection. Negative results do not preclude SARS-CoV-2 infection, do not rule out co-infections with other pathogens, and should not be used as the sole basis for treatment or other patient management decisions. Negative results must be combined with clinical observations, patient history, and epidemiological information. The expected result is Negative. Fact Sheet for Patients: SugarRoll.be Fact Sheet for Healthcare Providers: https://www.woods-mathews.com/ This test is not yet approved or cleared by the [place] FDA and  has been authorized for detection and/or diagnosis of  SARS-CoV-2 by FDA under an Emergency Use Authorization (EUA). This EUA will remain  in effect (meaning this test can be used) for the duration of the COVID-19 declaration under Section 56 4(b)(1) of the Act, 21 U.S.C. section 360bbb-3(b)(1), unless the authorization is terminated or revoked sooner. Performed at [facility], [place] [address], [place], [place] [address]     Radiology Reports Dg Knee 1-2 Views Right  Result Date: 06/11/2019 CLINICAL DATA:  ORIF of patellar fracture EXAM: RIGHT KNEE - 1-2 VIEW COMPARISON:  None. FLUOROSCOPY TIME:  Radiation Exposure Index (as provided by the fluoroscopic device): 0.24 mGy If the device does not provide the exposure index: Fluoroscopy Time:  3 seconds Number of Acquired Images:  3 FINDINGS: Right knee prosthesis is again seen. Patellar fracture has been reduced on the lateral view. No other focal abnormality is noted. IMPRESSION: Reduction of patellar fracture. Electronically Signed   By: Inez Catalina M.D.   On: 06/11/2019 16:44   Dg Chest Port 1 View  Result Date: 06/09/2019 CLINICAL DATA:  78 year old female with fall and fracture of the patella. Preop evaluation. EXAM: PORTABLE CHEST 1 VIEW COMPARISON:  Chest radiograph dated 05/25/2015 FINDINGS: The lungs are clear. There is no pleural effusion or pneumothorax. The cardiac silhouette is within normal limits. No acute osseous pathology. IMPRESSION: No active disease. Electronically Signed   By: Anner Crete M.D.   On: 06/09/2019 22:43   Dg Knee Complete 4 Views Right  Result Date: 06/09/2019 CLINICAL DATA:  Pain. EXAM: RIGHT KNEE - COMPLETE 4+ VIEW COMPARISON:  January 04, 2012 FINDINGS: The patient is status post total knee arthroplasty on the right. The hardware appears grossly intact. There is a displace/distracted transverse fracture through the patella. There is extensive surrounding soft tissue swelling. There is a small to moderate-sized joint effusion. IMPRESSION: 1. Acute displaced  transverse fracture through the patella. 2. Status post total knee arthroplasty on the right without evidence for a periprosthetic fracture. Electronically Signed   By: Constance Holster M.D.   On: 06/09/2019 21:54   Dg C-arm 1-60 Min-no Report  Result Date: 06/11/2019 Fluoroscopy was utilized by the requesting physician.  No radiographic interpretation.    Lab Data:  CBC: Recent Labs  Lab 06/09/19 2153 06/10/19 0246 06/11/19 0251 06/12/19 0308 06/13/19 0559  WBC 12.0* 9.9 6.1 11.4* 8.9  NEUTROABS  --   --   --  9.5*  --   HGB 11.2* 10.6* 10.3* 9.5* 9.5*  HCT 35.3* 33.6* 32.9* 30.4* 30.6*  MCV 97.5 97.4 98.8 98.1 99.4  PLT 253 253 213 203 728   Basic Metabolic Panel: Recent Labs  Lab 06/09/19 2153 06/10/19 0246 06/11/19 0251 06/12/19 0308  NA 137 136 138 137  K 4.7 4.4 4.3 5.0  CL 108 106 108 108  CO2 19* 20* 21* 21*  GLUCOSE 211* 175* 153* 213*  BUN 29* 27* 28* 27*  CREATININE 1.25* 1.25* 1.30* 1.24*  CALCIUM 9.6 9.1 9.1 8.8*   GFR: Estimated Creatinine Clearance: 49.2 mL/min (A) (by C-G formula based on SCr of 1.24 mg/dL (H)). Liver Function Tests: Recent Labs  Lab 06/09/19 2153  AST 18  ALT 18  ALKPHOS 74  BILITOT 0.6  PROT 7.6  ALBUMIN 3.7   No results for input(s): LIPASE, AMYLASE in the last 168 hours. No results for input(s): AMMONIA in the last 168 hours. Coagulation Profile: Recent Labs  Lab 06/11/19 0251  INR 1.1   Cardiac Enzymes: No results for input(s): CKTOTAL, CKMB, CKMBINDEX, TROPONINI in the last 168 hours. BNP (last 3 results) No results for input(s): PROBNP in the last 8760 hours. HbA1C: No results for input(s): HGBA1C in the last 72 hours. CBG: Recent Labs  Lab 06/13/19 0743 06/13/19 1127 06/13/19 1602 06/13/19 2241 06/14/19 0730  GLUCAP 142* 143* 149* 140* 154*   Lipid Profile: No results for input(s): CHOL, HDL, LDLCALC, TRIG, CHOLHDL, LDLDIRECT in the last 72 hours. Thyroid Function Tests: No results for input(s):  TSH, T4TOTAL, FREET4, T3FREE, THYROIDAB in the last 72 hours. Anemia Panel: No results  for input(s): VITAMINB12, FOLATE, FERRITIN, TIBC, IRON, RETICCTPCT in the last 72 hours. Urine analysis:    Component Value Date/Time   COLORURINE YELLOW 05/26/2008 1453   APPEARANCEUR CLOUDY (A) 05/26/2008 1453   LABSPEC 1.017 05/26/2008 1453   PHURINE 6.0 05/26/2008 1453   GLUCOSEU NEGATIVE 05/26/2008 1453   HGBUR NEGATIVE 05/26/2008 1453   BILIRUBINUR NEGATIVE 05/26/2008 1453   KETONESUR NEGATIVE 05/26/2008 1453   PROTEINUR NEGATIVE 05/26/2008 1453   UROBILINOGEN 0.2 05/26/2008 1453   NITRITE NEGATIVE 05/26/2008 1453   LEUKOCYTESUR LARGE (A) 05/26/2008 1453       M.D. [facility] 06/14/2019, 9:15 AM  Pager: [phone] Between 7am to 7pm - call Pager - [phone]  After 7pm go to [email or web address] - password [email or web address]  Call night coverage person covering after 7pm

## 2019-06-17 DIAGNOSIS — E039 Hypothyroidism, unspecified: Secondary | ICD-10-CM | POA: Diagnosis not present

## 2019-06-17 DIAGNOSIS — E119 Type 2 diabetes mellitus without complications: Secondary | ICD-10-CM | POA: Diagnosis not present

## 2019-06-17 DIAGNOSIS — R21 Rash and other nonspecific skin eruption: Secondary | ICD-10-CM | POA: Diagnosis not present

## 2019-06-17 DIAGNOSIS — I10 Essential (primary) hypertension: Secondary | ICD-10-CM | POA: Diagnosis not present

## 2019-06-17 DIAGNOSIS — M150 Primary generalized (osteo)arthritis: Secondary | ICD-10-CM | POA: Diagnosis not present

## 2019-06-17 DIAGNOSIS — K5900 Constipation, unspecified: Secondary | ICD-10-CM | POA: Diagnosis not present

## 2019-06-17 DIAGNOSIS — M109 Gout, unspecified: Secondary | ICD-10-CM | POA: Diagnosis not present

## 2019-06-17 DIAGNOSIS — E559 Vitamin D deficiency, unspecified: Secondary | ICD-10-CM | POA: Diagnosis not present

## 2019-06-17 DIAGNOSIS — S82034D Nondisplaced transverse fracture of right patella, subsequent encounter for closed fracture with routine healing: Secondary | ICD-10-CM | POA: Diagnosis not present

## 2019-06-17 DIAGNOSIS — D649 Anemia, unspecified: Secondary | ICD-10-CM | POA: Diagnosis not present

## 2019-06-17 DIAGNOSIS — H401290 Low-tension glaucoma, unspecified eye, stage unspecified: Secondary | ICD-10-CM | POA: Diagnosis not present

## 2019-06-17 DIAGNOSIS — G8929 Other chronic pain: Secondary | ICD-10-CM | POA: Diagnosis not present

## 2019-06-18 DIAGNOSIS — M1991 Primary osteoarthritis, unspecified site: Secondary | ICD-10-CM | POA: Diagnosis not present

## 2019-06-18 DIAGNOSIS — R21 Rash and other nonspecific skin eruption: Secondary | ICD-10-CM | POA: Diagnosis not present

## 2019-06-18 DIAGNOSIS — M1000 Idiopathic gout, unspecified site: Secondary | ICD-10-CM | POA: Diagnosis not present

## 2019-06-18 DIAGNOSIS — E559 Vitamin D deficiency, unspecified: Secondary | ICD-10-CM | POA: Diagnosis not present

## 2019-06-18 DIAGNOSIS — G8911 Acute pain due to trauma: Secondary | ICD-10-CM | POA: Diagnosis not present

## 2019-06-18 DIAGNOSIS — M6281 Muscle weakness (generalized): Secondary | ICD-10-CM | POA: Diagnosis not present

## 2019-06-18 DIAGNOSIS — S82091D Other fracture of right patella, subsequent encounter for closed fracture with routine healing: Secondary | ICD-10-CM | POA: Diagnosis not present

## 2019-06-18 DIAGNOSIS — E118 Type 2 diabetes mellitus with unspecified complications: Secondary | ICD-10-CM | POA: Diagnosis not present

## 2019-06-18 DIAGNOSIS — M8000XD Age-related osteoporosis with current pathological fracture, unspecified site, subsequent encounter for fracture with routine healing: Secondary | ICD-10-CM | POA: Diagnosis not present

## 2019-06-18 DIAGNOSIS — N189 Chronic kidney disease, unspecified: Secondary | ICD-10-CM | POA: Diagnosis not present

## 2019-06-18 DIAGNOSIS — E039 Hypothyroidism, unspecified: Secondary | ICD-10-CM | POA: Diagnosis not present

## 2019-06-18 DIAGNOSIS — K581 Irritable bowel syndrome with constipation: Secondary | ICD-10-CM | POA: Diagnosis not present

## 2019-06-19 ENCOUNTER — Other Ambulatory Visit: Payer: Self-pay | Admitting: *Deleted

## 2019-06-19 NOTE — Patient Outreach (Signed)
Member assessed for potential [facility] Care Management needs as a benefit of  [place] [employer].  Member is currently receiving rehab therapy at [facility].  Member discussed in weekly telephonic IDT meeting with facility staff, [facility] UM team, and writer.  [facility] staff reports care plan meeting is scheduled for tomorrow. Will know more about disposition plans at that time.   Will continue to follow potential [facility] Care Management needs, disposition plans, and for progression.  Will continue to collaborate with [facility] UM team and facility on member while at [facility].   Marthenia Rolling, MSN-Ed, RN,BSN [place] Acute Care Coordinator [phone] [facility] [phone]  (Toll free office)

## 2019-06-24 DIAGNOSIS — E118 Type 2 diabetes mellitus with unspecified complications: Secondary | ICD-10-CM | POA: Diagnosis not present

## 2019-06-24 DIAGNOSIS — M1991 Primary osteoarthritis, unspecified site: Secondary | ICD-10-CM | POA: Diagnosis not present

## 2019-06-24 DIAGNOSIS — E039 Hypothyroidism, unspecified: Secondary | ICD-10-CM | POA: Diagnosis not present

## 2019-06-24 DIAGNOSIS — S82091D Other fracture of right patella, subsequent encounter for closed fracture with routine healing: Secondary | ICD-10-CM | POA: Diagnosis not present

## 2019-06-24 DIAGNOSIS — R21 Rash and other nonspecific skin eruption: Secondary | ICD-10-CM | POA: Diagnosis not present

## 2019-06-24 DIAGNOSIS — M8000XD Age-related osteoporosis with current pathological fracture, unspecified site, subsequent encounter for fracture with routine healing: Secondary | ICD-10-CM | POA: Diagnosis not present

## 2019-06-24 DIAGNOSIS — H409 Unspecified glaucoma: Secondary | ICD-10-CM | POA: Diagnosis not present

## 2019-06-24 DIAGNOSIS — N189 Chronic kidney disease, unspecified: Secondary | ICD-10-CM | POA: Diagnosis not present

## 2019-06-24 DIAGNOSIS — E559 Vitamin D deficiency, unspecified: Secondary | ICD-10-CM | POA: Diagnosis not present

## 2019-06-24 DIAGNOSIS — D649 Anemia, unspecified: Secondary | ICD-10-CM | POA: Diagnosis not present

## 2019-06-24 DIAGNOSIS — I10 Essential (primary) hypertension: Secondary | ICD-10-CM | POA: Diagnosis not present

## 2019-06-24 DIAGNOSIS — M6281 Muscle weakness (generalized): Secondary | ICD-10-CM | POA: Diagnosis not present

## 2019-06-26 ENCOUNTER — Other Ambulatory Visit: Payer: Self-pay | Admitting: *Deleted

## 2019-06-26 ENCOUNTER — Ambulatory Visit (INDEPENDENT_AMBULATORY_CARE_PROVIDER_SITE_OTHER): Payer: Medicare Other | Admitting: Surgery

## 2019-06-26 ENCOUNTER — Ambulatory Visit: Payer: Self-pay

## 2019-06-26 DIAGNOSIS — S82034A Nondisplaced transverse fracture of right patella, initial encounter for closed fracture: Secondary | ICD-10-CM

## 2019-06-26 NOTE — Patient Outreach (Signed)
Member assessed for potential [facility] Care Management needs as a benefit of  [place] Medicare.  Member is currently receiving rehab therapy at [facility].  Member discussed in weekly telephonic IDT meeting with facility staff, [facility] UM team, and writer.  Facility reports member is extremely hard of hearing but has a special telephone at home that accomodates her hearing needs. Member has a ortho appointment today and facility will follow up with family regarding potential caregiver needs after appointment.  Ms.  Cutino has a very supportive neighbor who assists her. Facility reports member lives alone and was independent prior to admission.  Discussed that writer is following for potential [facility] Care Management services. Will likely have to outreach to member once she returns home to speak with her on a phone that assists hearing impaired.      Autumn Rolling, MSN-Ed, RN,BSN [place] Acute Care Coordinator [phone] [facility]) [phone]  (Toll free office)

## 2019-06-26 NOTE — Progress Notes (Signed)
Post-Op Visit Note   Patient: Autumn Chambers           Date of Birth: May 07, 1941           MRN: [id number] Visit Date: 06/26/2019 PCP: Kathyrn Lass, MD   Assessment & Plan:  Chief Complaint:  Chief Complaint  Patient presents with  . Right Knee - Routine Post Op    ORIF RT Patella fracture  78 year old female returns.  Status post ORIF right patella fracture.  As previously documented she is status post total knee replacement several years ago.  Patient states that skilled facility has been taking her brace off when she gets in the shower.  I have reviewed the doctors note from the facility and that is also mentioned in there.  Dr. Otho Ket discharge instructions clearly stated that the brace needed to be on at all times even when showering.  Patient is hard of hearing and her neighbor comes in with her today to give assistance.   Visit Diagnoses:  1. Closed nondisplaced transverse fracture of right patella, initial encounter     Plan: Patient neighbor came with her today advised that the brace needs to be on at all times even when showering locked in full extension.  I also made note of this in the patient's paperwork from the skilled facility.  Also sent a copy of Dr. Otho Ket discharge summary which included instructions regarding keeping the brace on and showering.  Reviewed x-rays with patient today.  Patient understands that if the brace is removed and she continues to bend her knee that she will continue to lose reduction of her fracture which could lead to further surgery.  Follow-up with Dr. Louanne Skye in 2 weeks for recheck.  Today staples were removed and Steri-Strips applied.  Follow-Up Instructions: Return in about 2 weeks (around 07/10/2019) for with Dr Louanne Skye recheck knee.   Orders:  Orders Placed This Encounter  Procedures  . XR Knee 1-2 Views Right   No orders of the defined types were placed in this encounter.   Imaging: No results found.  PMFS History: Patient  Active Problem List   Diagnosis Date Noted  . Closed displaced fracture of right patella 06/09/2019  . Idiopathic chronic gout, unspecified site, without tophus (tophi) 12/12/2016  . Dyspnea 06/26/2015  . Angina pectoris ([place]) 06/26/2015  . Former smoker 06/26/2015  . Obesity 06/26/2015  . Type 2 diabetes mellitus without complication ([place]) 123456  . OSA (obstructive sleep apnea) 06/26/2015  . Aortic stenosis 06/26/2015  . Breast cancer of upper-inner quadrant of left female breast ([place]) 09/05/2013  . Osteoporosis 08/22/2013  . GERD (gastroesophageal reflux disease) 08/22/2013  . Hypothyroidism 08/22/2013  . Anxiety 08/22/2013  . Hypertension 08/22/2013  . Diabetes due to undrl condition w oth diabetic kidney comp ([place]) 08/22/2013  . Osteoarthritis of left knee 08/02/2013    Class: Diagnosis of   Past Medical History:  Diagnosis Date  . Anemia   . Arthritis   . Cancer ([place])    hx of skin ca  . Chronic kidney disease    CRI- sees Dr Posey Pronto  . Diabetes mellitus    oral  . Gout   . H/O hiatal hernia   . Hearing loss   . Hyperlipidemia   . Hypertension   . Hypothyroidism   . Irritable bowel   . Osteoporosis 02/2013   T score -2.9  . PONV (postoperative nausea and vomiting)    yearsago  . Shortness of breath    "  mild upon exertion"  . Urinary tract infection    hx of    Family History  Problem Relation Age of Onset  . Cancer Father        Prostate  . Cancer Sister        Pancreatic cancer  . Cancer Brother        Brain cancer  . Hypertension Brother   . Heart disease Mother     Past Surgical History:  Procedure Laterality Date  . ACHILLES TENDON SURGERY    . BREAST LUMPECTOMY     left, with 2 nodes removed  . DILATION AND CURETTAGE OF UTERUS    . EYE SURGERY     bilateral cataract surgery  . ORIF PATELLA Right 06/11/2019   Procedure: OPEN REDUCTION INTERNAL (ORIF) FIXATION RIGHT PATELLA FRACTURE WITH PINS, WIRE, CANNULATED SCREWS, CERCLAGE WIRE PATELLA  TO TIBIA;  Surgeon: Jessy Oto, MD;  Location: [facility];  Service: Orthopedics;  Laterality: Right;  . SKIN CANCER EXCISION     approx 4 years ago.  Nose  . TONSILLECTOMY    . TOTAL KNEE ARTHROPLASTY  01/04/2012   Procedure: TOTAL KNEE ARTHROPLASTY;  Surgeon: Newt Minion, MD;  Location: [facility];  Service: Orthopedics;  Laterality: Right;  Right Total Knee Arthroplasty  . TOTAL KNEE ARTHROPLASTY Left 07/31/2013   Procedure: LEFT TOTAL KNEE ARTHROPLASTY;  Surgeon: Newt Minion, MD;  Location: [facility];  Service: Orthopedics;  Laterality: Left;  Left Total Knee Arthroplasty   Social History   Occupational History  . Not on file  Tobacco Use  . Smoking status: Former Smoker    Years: 40.00    Types: Cigarettes  . Smokeless tobacco: Never Used  . Tobacco comment: stopped smoking 2009  Substance and Sexual Activity  . Alcohol use: No  . Drug use: No  . Sexual activity: Not on file    Exam Pleasant elderly female.  No acute distress.  Right knee wound looks good.  Staples removed and Steri-Strips applied.  No drainage or signs of infection.

## 2019-06-28 ENCOUNTER — Encounter: Payer: Self-pay | Admitting: Surgery

## 2019-07-10 ENCOUNTER — Other Ambulatory Visit: Payer: Self-pay | Admitting: *Deleted

## 2019-07-10 ENCOUNTER — Encounter: Payer: Self-pay | Admitting: Specialist

## 2019-07-10 ENCOUNTER — Ambulatory Visit (INDEPENDENT_AMBULATORY_CARE_PROVIDER_SITE_OTHER): Payer: Medicare Other

## 2019-07-10 ENCOUNTER — Ambulatory Visit (INDEPENDENT_AMBULATORY_CARE_PROVIDER_SITE_OTHER): Payer: Medicare Other | Admitting: Specialist

## 2019-07-10 ENCOUNTER — Ambulatory Visit: Payer: BLUE CROSS/BLUE SHIELD | Admitting: Podiatry

## 2019-07-10 VITALS — BP 141/80 | HR 91 | Ht 66.0 in | Wt 264.0 lb

## 2019-07-10 DIAGNOSIS — S82034A Nondisplaced transverse fracture of right patella, initial encounter for closed fracture: Secondary | ICD-10-CM

## 2019-07-10 NOTE — Patient Outreach (Signed)
Member assessed for potential [facility] Care Management needs as a benefit of  [place] Medicare.  Member is currently receiving rehab therapy at [facility].  Collaboration with [place] after her telephonic IDT meeting with [facility] facility staff.    Member is slated for dc on 07/15/19 to home alone. Facility previously reported that member has supportive neighbors.   Will need [facility] Care Management follow up. Member is hearing impaired. Writer will likely need to contact member upon returning home to speak on member's hearing impaired phone.  Will continue to follow for potential [facility] Care Management needs. Will plan outreach to member.   Marthenia Rolling, MSN-Ed, RN,BSN [place] Acute Care Coordinator [phone] [facility]) [phone]  (Toll free office)

## 2019-07-10 NOTE — Patient Instructions (Signed)
    Keep knee brace dry while bathing using a plastic bag around the right leg.. Call if fever or chills or increased drainage. Go to ER if acutely short of breath or call for ambulance. Return for follow up in 2 weeks. May full weight bear on the right leg with the brace on. Take full strength aspirin 325 mg with meal or snack twice a day to prevent blood clots.

## 2019-07-10 NOTE — Progress Notes (Signed)
Post-Op Visit Note   Patient: Autumn Chambers           Date of Birth: February 03, 1941           MRN: [id number] Visit Date: 07/10/2019 PCP: Kathyrn Lass, MD   Assessment & Plan:4 weeks post ORIF right transverse patella fracture in a right total knee replacement, fixation suture through holes, a cerclage of the patella and a tibia over the superior patella cerclage is present, there is about 31mm of displacement since surgery but there is no worsening since two weeks ago and callus is present suggesting that there is signs of early healing. Need 2 more weeks immobilization with the brace and in 2 weeks will begin progressive ROM of the right knee opening the right bledsoe hinges to allow 0-30 degrees of motion, then in weeks ( 4 weeks from now) increase to 0-60 degrees then at 3 months post op open hinges to 0-90 Chief Complaint:  Chief Complaint  Patient presents with  . Right Knee - Routine Post Op   Visit Diagnoses:  1. Closed nondisplaced transverse fracture of right patella, initial encounter   Incision is healed. ROM is full extension, not ranged today as the fracture is not healed yet. Plain radiographs demonstrate right patella fracture transverse mid to lower 1/3 right patella.  7.2 mm of displacement with bone on bone contact of the anterior and posterior portions of the fracture, there is callus forming between the major fragment noted.   Plan:   Keep knee brace dry while bathing using a plastic bag around the right leg.. Call if fever or chills or increased drainage. Go to ER if acutely short of breath or call for ambulance. Return for follow up in 2 weeks May full weight bear on the right leg with the brace on. Take full strength aspirin 325 mg with meal or snack twice a day to prevent blood clots.     Follow-Up Instructions: Return in about 2 weeks (around 07/24/2019).   Orders:  Orders Placed This Encounter  Procedures  . XR Knee 1-2 Views Right   No orders of the  defined types were placed in this encounter.   Imaging: No results found.  PMFS History: Patient Active Problem List   Diagnosis Date Noted  . Closed displaced fracture of right patella 06/09/2019  . Idiopathic chronic gout, unspecified site, without tophus (tophi) 12/12/2016  . Dyspnea 06/26/2015  . Angina pectoris ([place]) 06/26/2015  . Former smoker 06/26/2015  . Obesity 06/26/2015  . Type 2 diabetes mellitus without complication ([place]) 123456  . OSA (obstructive sleep apnea) 06/26/2015  . Aortic stenosis 06/26/2015  . Breast cancer of upper-inner quadrant of left female breast ([place]) 09/05/2013  . Osteoporosis 08/22/2013  . GERD (gastroesophageal reflux disease) 08/22/2013  . Hypothyroidism 08/22/2013  . Anxiety 08/22/2013  . Hypertension 08/22/2013  . Diabetes due to undrl condition w oth diabetic kidney comp ([place]) 08/22/2013  . Osteoarthritis of left knee 08/02/2013    Class: Diagnosis of   Past Medical History:  Diagnosis Date  . Anemia   . Arthritis   . Cancer ([place])    hx of skin ca  . Chronic kidney disease    CRI- sees Dr Posey Pronto  . Diabetes mellitus    oral  . Gout   . H/O hiatal hernia   . Hearing loss   . Hyperlipidemia   . Hypertension   . Hypothyroidism   . Irritable bowel   . Osteoporosis 02/2013  T score -2.9  . PONV (postoperative nausea and vomiting)    yearsago  . Shortness of breath    "mild upon exertion"  . Urinary tract infection    hx of    Family History  Problem Relation Age of Onset  . Cancer Father        Prostate  . Cancer Sister        Pancreatic cancer  . Cancer Brother        Brain cancer  . Hypertension Brother   . Heart disease Mother     Past Surgical History:  Procedure Laterality Date  . ACHILLES TENDON SURGERY    . BREAST LUMPECTOMY     left, with 2 nodes removed  . DILATION AND CURETTAGE OF UTERUS    . EYE SURGERY     bilateral cataract surgery  . ORIF PATELLA Right 06/11/2019   Procedure: OPEN REDUCTION  INTERNAL (ORIF) FIXATION RIGHT PATELLA FRACTURE WITH PINS, WIRE, CANNULATED SCREWS, CERCLAGE WIRE PATELLA TO TIBIA;  Surgeon: Jessy Oto, MD;  [facility];  Service: Orthopedics;  Laterality: Right;  . SKIN CANCER EXCISION     approx 4 years ago.  Nose  . TONSILLECTOMY    . TOTAL KNEE ARTHROPLASTY  01/04/2012   Procedure: TOTAL KNEE ARTHROPLASTY;  Surgeon: Newt Minion, MD;  Location: [place];  Service: Orthopedics;  Laterality: Right;  Right Total Knee Arthroplasty  . TOTAL KNEE ARTHROPLASTY Left 07/31/2013   Procedure: LEFT TOTAL KNEE ARTHROPLASTY;  Surgeon: Newt Minion, MD;  Location: [place];  Service: Orthopedics;  Laterality: Left;  Left Total Knee Arthroplasty   Social History   Occupational History  . Not on file  Tobacco Use  . Smoking status: Former Smoker    Years: 40.00    Types: Cigarettes  . Smokeless tobacco: Never Used  . Tobacco comment: stopped smoking 2009  Substance and Sexual Activity  . Alcohol use: No  . Drug use: No  . Sexual activity: Not on file

## 2019-07-11 DIAGNOSIS — M8000XD Age-related osteoporosis with current pathological fracture, unspecified site, subsequent encounter for fracture with routine healing: Secondary | ICD-10-CM | POA: Diagnosis not present

## 2019-07-11 DIAGNOSIS — E118 Type 2 diabetes mellitus with unspecified complications: Secondary | ICD-10-CM | POA: Diagnosis not present

## 2019-07-11 DIAGNOSIS — M150 Primary generalized (osteo)arthritis: Secondary | ICD-10-CM | POA: Diagnosis not present

## 2019-07-11 DIAGNOSIS — S82091D Other fracture of right patella, subsequent encounter for closed fracture with routine healing: Secondary | ICD-10-CM | POA: Diagnosis not present

## 2019-07-11 DIAGNOSIS — M6281 Muscle weakness (generalized): Secondary | ICD-10-CM | POA: Diagnosis not present

## 2019-07-15 DIAGNOSIS — R21 Rash and other nonspecific skin eruption: Secondary | ICD-10-CM | POA: Diagnosis not present

## 2019-07-15 DIAGNOSIS — E559 Vitamin D deficiency, unspecified: Secondary | ICD-10-CM | POA: Diagnosis not present

## 2019-07-15 DIAGNOSIS — S82091D Other fracture of right patella, subsequent encounter for closed fracture with routine healing: Secondary | ICD-10-CM | POA: Diagnosis not present

## 2019-07-15 DIAGNOSIS — E039 Hypothyroidism, unspecified: Secondary | ICD-10-CM | POA: Diagnosis not present

## 2019-07-15 DIAGNOSIS — D649 Anemia, unspecified: Secondary | ICD-10-CM | POA: Diagnosis not present

## 2019-07-15 DIAGNOSIS — E118 Type 2 diabetes mellitus with unspecified complications: Secondary | ICD-10-CM | POA: Diagnosis not present

## 2019-07-15 DIAGNOSIS — M1991 Primary osteoarthritis, unspecified site: Secondary | ICD-10-CM | POA: Diagnosis not present

## 2019-07-15 DIAGNOSIS — M8000XD Age-related osteoporosis with current pathological fracture, unspecified site, subsequent encounter for fracture with routine healing: Secondary | ICD-10-CM | POA: Diagnosis not present

## 2019-07-15 DIAGNOSIS — H409 Unspecified glaucoma: Secondary | ICD-10-CM | POA: Diagnosis not present

## 2019-07-15 DIAGNOSIS — M6281 Muscle weakness (generalized): Secondary | ICD-10-CM | POA: Diagnosis not present

## 2019-07-15 DIAGNOSIS — M150 Primary generalized (osteo)arthritis: Secondary | ICD-10-CM | POA: Diagnosis not present

## 2019-07-15 DIAGNOSIS — N189 Chronic kidney disease, unspecified: Secondary | ICD-10-CM | POA: Diagnosis not present

## 2019-07-16 ENCOUNTER — Other Ambulatory Visit: Payer: Self-pay | Admitting: *Deleted

## 2019-07-16 NOTE — Patient Outreach (Addendum)
[facility] PAC follow up.  Telephone call made to [facility] home at [phone] as member was slated for dc home on 07/15/19. No answer. HIPAA compliant voicemail message left for return call.  Spoke with [facility] UM RN who reports member appealed discharge from yesterday. Member slated for dc today 07/16/19. Discussed that writer will make referral for [facility] CM follow up.  Update/Addendum: While in the process of making [facility] CM referral. Writer received a call from Mrs. Novinger's nephew, Mikki Santee. Patient identifiers confirmed. States Mrs. Graybill is now home and that they will have home health and private duty for 7 hours a day. States the agency is in the home currently and that "we have it all covered." States if they need any other assistance they have writer's telephone number to contact. Expressed appreciation of writer's outreach.  Will sign off as [facility] CM services were declined at this time.   Will alert [facility] UM RN.    Marthenia Rolling, MSN-Ed, RN,BSN [facility] Acute Care Coordinator [phone] [facility]) [phone]  (Toll free office)

## 2019-07-18 DIAGNOSIS — I129 Hypertensive chronic kidney disease with stage 1 through stage 4 chronic kidney disease, or unspecified chronic kidney disease: Secondary | ICD-10-CM | POA: Diagnosis not present

## 2019-07-18 DIAGNOSIS — M9711XD Periprosthetic fracture around internal prosthetic right knee joint, subsequent encounter: Secondary | ICD-10-CM | POA: Diagnosis not present

## 2019-07-18 DIAGNOSIS — M159 Polyosteoarthritis, unspecified: Secondary | ICD-10-CM | POA: Diagnosis not present

## 2019-07-18 DIAGNOSIS — D631 Anemia in chronic kidney disease: Secondary | ICD-10-CM | POA: Diagnosis not present

## 2019-07-18 DIAGNOSIS — I350 Nonrheumatic aortic (valve) stenosis: Secondary | ICD-10-CM | POA: Diagnosis not present

## 2019-07-18 DIAGNOSIS — E669 Obesity, unspecified: Secondary | ICD-10-CM | POA: Diagnosis not present

## 2019-07-18 DIAGNOSIS — E039 Hypothyroidism, unspecified: Secondary | ICD-10-CM | POA: Diagnosis not present

## 2019-07-18 DIAGNOSIS — N189 Chronic kidney disease, unspecified: Secondary | ICD-10-CM | POA: Diagnosis not present

## 2019-07-18 DIAGNOSIS — M6281 Muscle weakness (generalized): Secondary | ICD-10-CM | POA: Diagnosis not present

## 2019-07-18 DIAGNOSIS — Z87891 Personal history of nicotine dependence: Secondary | ICD-10-CM | POA: Diagnosis not present

## 2019-07-18 DIAGNOSIS — Z9181 History of falling: Secondary | ICD-10-CM | POA: Diagnosis not present

## 2019-07-18 DIAGNOSIS — Z7984 Long term (current) use of oral hypoglycemic drugs: Secondary | ICD-10-CM | POA: Diagnosis not present

## 2019-07-18 DIAGNOSIS — E1122 Type 2 diabetes mellitus with diabetic chronic kidney disease: Secondary | ICD-10-CM | POA: Diagnosis not present

## 2019-07-18 DIAGNOSIS — Z96653 Presence of artificial knee joint, bilateral: Secondary | ICD-10-CM | POA: Diagnosis not present

## 2019-07-18 DIAGNOSIS — M80061D Age-related osteoporosis with current pathological fracture, right lower leg, subsequent encounter for fracture with routine healing: Secondary | ICD-10-CM | POA: Diagnosis not present

## 2019-07-18 DIAGNOSIS — M109 Gout, unspecified: Secondary | ICD-10-CM | POA: Diagnosis not present

## 2019-07-18 DIAGNOSIS — Z6835 Body mass index (BMI) 35.0-35.9, adult: Secondary | ICD-10-CM | POA: Diagnosis not present

## 2019-07-18 DIAGNOSIS — H409 Unspecified glaucoma: Secondary | ICD-10-CM | POA: Diagnosis not present

## 2019-07-19 ENCOUNTER — Telehealth: Payer: Self-pay

## 2019-07-19 NOTE — Telephone Encounter (Signed)
Liji with [facility] would like verbal orders for PT only for 2 x week for 6wks and 1 x week for 1wk? Would like WTB status and restrictions on knee flexion?  PT will start next week, patient was evaluated on Thursday, 07/18/2019.  CB# is [phone].  Please advise.

## 2019-07-22 NOTE — Telephone Encounter (Signed)
Printed and gave to Dr. Nitka  

## 2019-07-23 DIAGNOSIS — M9711XD Periprosthetic fracture around internal prosthetic right knee joint, subsequent encounter: Secondary | ICD-10-CM | POA: Diagnosis not present

## 2019-07-23 DIAGNOSIS — E1122 Type 2 diabetes mellitus with diabetic chronic kidney disease: Secondary | ICD-10-CM | POA: Diagnosis not present

## 2019-07-23 DIAGNOSIS — N189 Chronic kidney disease, unspecified: Secondary | ICD-10-CM | POA: Diagnosis not present

## 2019-07-23 DIAGNOSIS — I129 Hypertensive chronic kidney disease with stage 1 through stage 4 chronic kidney disease, or unspecified chronic kidney disease: Secondary | ICD-10-CM | POA: Diagnosis not present

## 2019-07-23 DIAGNOSIS — I350 Nonrheumatic aortic (valve) stenosis: Secondary | ICD-10-CM | POA: Diagnosis not present

## 2019-07-23 DIAGNOSIS — M80061D Age-related osteoporosis with current pathological fracture, right lower leg, subsequent encounter for fracture with routine healing: Secondary | ICD-10-CM | POA: Diagnosis not present

## 2019-07-23 NOTE — Telephone Encounter (Signed)
I spoke with Dr. Louanne Skye, he is ok with PT however she can Weightbear as tolerated with the immobilizer on ONLY, and she cannot flex her knee at all.  I called and lmom advising Liji of this.

## 2019-07-24 DIAGNOSIS — E1122 Type 2 diabetes mellitus with diabetic chronic kidney disease: Secondary | ICD-10-CM | POA: Diagnosis not present

## 2019-07-24 DIAGNOSIS — I129 Hypertensive chronic kidney disease with stage 1 through stage 4 chronic kidney disease, or unspecified chronic kidney disease: Secondary | ICD-10-CM | POA: Diagnosis not present

## 2019-07-24 DIAGNOSIS — N189 Chronic kidney disease, unspecified: Secondary | ICD-10-CM | POA: Diagnosis not present

## 2019-07-24 DIAGNOSIS — M9711XD Periprosthetic fracture around internal prosthetic right knee joint, subsequent encounter: Secondary | ICD-10-CM | POA: Diagnosis not present

## 2019-07-24 DIAGNOSIS — I350 Nonrheumatic aortic (valve) stenosis: Secondary | ICD-10-CM | POA: Diagnosis not present

## 2019-07-24 DIAGNOSIS — M80061D Age-related osteoporosis with current pathological fracture, right lower leg, subsequent encounter for fracture with routine healing: Secondary | ICD-10-CM | POA: Diagnosis not present

## 2019-07-25 ENCOUNTER — Other Ambulatory Visit: Payer: Self-pay

## 2019-07-25 ENCOUNTER — Ambulatory Visit: Payer: Self-pay

## 2019-07-25 ENCOUNTER — Telehealth: Payer: Self-pay | Admitting: Specialist

## 2019-07-25 ENCOUNTER — Ambulatory Visit (INDEPENDENT_AMBULATORY_CARE_PROVIDER_SITE_OTHER): Payer: Medicare Other | Admitting: Specialist

## 2019-07-25 ENCOUNTER — Encounter: Payer: Self-pay | Admitting: Specialist

## 2019-07-25 ENCOUNTER — Telehealth: Payer: Self-pay | Admitting: Physical Therapy

## 2019-07-25 VITALS — BP 153/71 | HR 81 | Ht 66.0 in | Wt 264.0 lb

## 2019-07-25 DIAGNOSIS — S82034A Nondisplaced transverse fracture of right patella, initial encounter for closed fracture: Secondary | ICD-10-CM

## 2019-07-25 NOTE — Telephone Encounter (Signed)
Called patient regarding therapy order received from Dr. Louanne Skye in order to inquire if wishing to attend in person or telehealth option given high Covid 19 risk. Patient reports she is already receiving PT at home. Informed patient that clinic has PT order and if needing outpatient therapy when home health is completed to contact clinic to schedule.

## 2019-07-25 NOTE — Progress Notes (Signed)
Post-Op Visit Note   Patient: Autumn Chambers           Date of Birth: Nov 16, 1940           MRN: [id number] Visit Date: 07/25/2019 PCP: Kathyrn Lass, MD   Assessment & Plan:6 weeks post right patella ORIF with resuture of the distal pole to the proximal patella with cerclage and a tibial-patella cerclage.   Chief Complaint:  Chief Complaint  Patient presents with  . Right Knee - Routine Post Op   Visit Diagnoses:  1. Closed nondisplaced transverse fracture of right patella, initial encounter   Incision is healed and there is extension of the right knee against gravity. Right bledsoe brace is in disarray with the hinges facing posterior, the brace is removed and replaced and adjusted. Radiographs show the fracture transverse patella with mild less than 1.0 cm of displacement, there is bone on bone appearance and some flexion at the fracture site, early bone healing is evident.   She has active extension of the right knee to 0 degrees. With the brace removed she can flex and extend 30 degrees.  Plan: The right knee bledsoe brace is opened at the hinge to allow for 0-30 degrees of ROM. Recommend PT to work on active ROM of the right knee 0-30 degrees. Brace stays on but she can remove the brace when she is  In shower and sitting on a tub seat.  Return in 2 weeks for increasing the ROM to 0-60 degrees and then 4 weeks for increase to 0-90 degrees. Stay on aspirin for blood thinner. Baby aspirin  Follow-Up Instructions: No follow-ups on file.   Orders:  Orders Placed This Encounter  Procedures  . XR Knee 1-2 Views Right   No orders of the defined types were placed in this encounter.   Imaging: No results found.  PMFS History: Patient Active Problem List   Diagnosis Date Noted  . Closed displaced fracture of right patella 06/09/2019  . Idiopathic chronic gout, unspecified site, without tophus (tophi) 12/12/2016  . Dyspnea 06/26/2015  . Angina pectoris ([place]) 06/26/2015  .  Former smoker 06/26/2015  . Obesity 06/26/2015  . Type 2 diabetes mellitus without complication ([place]) 123456  . OSA (obstructive sleep apnea) 06/26/2015  . Aortic stenosis 06/26/2015  . Breast cancer of upper-inner quadrant of left female breast ([place]) 09/05/2013  . Osteoporosis 08/22/2013  . GERD (gastroesophageal reflux disease) 08/22/2013  . Hypothyroidism 08/22/2013  . Anxiety 08/22/2013  . Hypertension 08/22/2013  . Diabetes due to undrl condition w oth diabetic kidney comp ([place]) 08/22/2013  . Osteoarthritis of left knee 08/02/2013    Class: Diagnosis of   Past Medical History:  Diagnosis Date  . Anemia   . Arthritis   . Cancer ([place])    hx of skin ca  . Chronic kidney disease    CRI- sees Dr Posey Pronto  . Diabetes mellitus    oral  . Gout   . H/O hiatal hernia   . Hearing loss   . Hyperlipidemia   . Hypertension   . Hypothyroidism   . Irritable bowel   . Osteoporosis 02/2013   T score -2.9  . PONV (postoperative nausea and vomiting)    yearsago  . Shortness of breath    "mild upon exertion"  . Urinary tract infection    hx of    Family History  Problem Relation Age of Onset  . Cancer Father        Prostate  .  Cancer Sister        Pancreatic cancer  . Cancer Brother        Brain cancer  . Hypertension Brother   . Heart disease Mother     Past Surgical History:  Procedure Laterality Date  . ACHILLES TENDON SURGERY    . BREAST LUMPECTOMY     left, with 2 nodes removed  . DILATION AND CURETTAGE OF UTERUS    . EYE SURGERY     bilateral cataract surgery  . ORIF PATELLA Right 06/11/2019   Procedure: OPEN REDUCTION INTERNAL (ORIF) FIXATION RIGHT PATELLA FRACTURE WITH PINS, WIRE, CANNULATED SCREWS, CERCLAGE WIRE PATELLA TO TIBIA;  Surgeon: Jessy Oto, MD;  [facility];  Service: Orthopedics;  Laterality: Right;  . SKIN CANCER EXCISION     approx 4 years ago.  Nose  . TONSILLECTOMY    . TOTAL KNEE ARTHROPLASTY  01/04/2012   Procedure: TOTAL KNEE  ARTHROPLASTY;  Surgeon: Newt Minion, MD;  Location: [place];  Service: Orthopedics;  Laterality: Right;  Right Total Knee Arthroplasty  . TOTAL KNEE ARTHROPLASTY Left 07/31/2013   Procedure: LEFT TOTAL KNEE ARTHROPLASTY;  Surgeon: Newt Minion, MD;  Location: [place];  Service: Orthopedics;  Laterality: Left;  Left Total Knee Arthroplasty   Social History   Occupational History  . Not on file  Tobacco Use  . Smoking status: Former Smoker    Years: 40.00    Types: Cigarettes  . Smokeless tobacco: Never Used  . Tobacco comment: stopped smoking 2009  Substance and Sexual Activity  . Alcohol use: No  . Drug use: No  . Sexual activity: Not on file

## 2019-07-25 NOTE — Telephone Encounter (Signed)
Sharyn Lull with [facility] called in requesting verbal orders for occupational therapy for 1 time a week for 1 week and 2 times a week for 3 weeks.  [phone]

## 2019-07-26 ENCOUNTER — Telehealth: Payer: Self-pay | Admitting: Specialist

## 2019-07-26 ENCOUNTER — Other Ambulatory Visit: Payer: Self-pay | Admitting: Specialist

## 2019-07-26 DIAGNOSIS — E1122 Type 2 diabetes mellitus with diabetic chronic kidney disease: Secondary | ICD-10-CM | POA: Diagnosis not present

## 2019-07-26 DIAGNOSIS — N189 Chronic kidney disease, unspecified: Secondary | ICD-10-CM | POA: Diagnosis not present

## 2019-07-26 DIAGNOSIS — I350 Nonrheumatic aortic (valve) stenosis: Secondary | ICD-10-CM | POA: Diagnosis not present

## 2019-07-26 DIAGNOSIS — S82032G Displaced transverse fracture of left patella, subsequent encounter for closed fracture with delayed healing: Secondary | ICD-10-CM

## 2019-07-26 DIAGNOSIS — M80061D Age-related osteoporosis with current pathological fracture, right lower leg, subsequent encounter for fracture with routine healing: Secondary | ICD-10-CM | POA: Diagnosis not present

## 2019-07-26 DIAGNOSIS — I129 Hypertensive chronic kidney disease with stage 1 through stage 4 chronic kidney disease, or unspecified chronic kidney disease: Secondary | ICD-10-CM | POA: Diagnosis not present

## 2019-07-26 DIAGNOSIS — M9711XD Periprosthetic fracture around internal prosthetic right knee joint, subsequent encounter: Secondary | ICD-10-CM | POA: Diagnosis not present

## 2019-07-26 NOTE — Telephone Encounter (Signed)
Autumn Chambers with [facility] called in requesting verbal orders for occupational therapy for 1 time a week for 1 week and 2 times a week for 3 weeks.  [phone]

## 2019-07-26 NOTE — Telephone Encounter (Signed)
Order placed

## 2019-07-29 NOTE — Telephone Encounter (Signed)
Verbal given 

## 2019-07-30 DIAGNOSIS — I350 Nonrheumatic aortic (valve) stenosis: Secondary | ICD-10-CM | POA: Diagnosis not present

## 2019-07-30 DIAGNOSIS — N189 Chronic kidney disease, unspecified: Secondary | ICD-10-CM | POA: Diagnosis not present

## 2019-07-30 DIAGNOSIS — I129 Hypertensive chronic kidney disease with stage 1 through stage 4 chronic kidney disease, or unspecified chronic kidney disease: Secondary | ICD-10-CM | POA: Diagnosis not present

## 2019-07-30 DIAGNOSIS — E1122 Type 2 diabetes mellitus with diabetic chronic kidney disease: Secondary | ICD-10-CM | POA: Diagnosis not present

## 2019-07-30 DIAGNOSIS — M9711XD Periprosthetic fracture around internal prosthetic right knee joint, subsequent encounter: Secondary | ICD-10-CM | POA: Diagnosis not present

## 2019-07-30 DIAGNOSIS — M80061D Age-related osteoporosis with current pathological fracture, right lower leg, subsequent encounter for fracture with routine healing: Secondary | ICD-10-CM | POA: Diagnosis not present

## 2019-08-01 DIAGNOSIS — M9711XD Periprosthetic fracture around internal prosthetic right knee joint, subsequent encounter: Secondary | ICD-10-CM | POA: Diagnosis not present

## 2019-08-01 DIAGNOSIS — I350 Nonrheumatic aortic (valve) stenosis: Secondary | ICD-10-CM | POA: Diagnosis not present

## 2019-08-01 DIAGNOSIS — N189 Chronic kidney disease, unspecified: Secondary | ICD-10-CM | POA: Diagnosis not present

## 2019-08-01 DIAGNOSIS — E1122 Type 2 diabetes mellitus with diabetic chronic kidney disease: Secondary | ICD-10-CM | POA: Diagnosis not present

## 2019-08-01 DIAGNOSIS — M80061D Age-related osteoporosis with current pathological fracture, right lower leg, subsequent encounter for fracture with routine healing: Secondary | ICD-10-CM | POA: Diagnosis not present

## 2019-08-01 DIAGNOSIS — I129 Hypertensive chronic kidney disease with stage 1 through stage 4 chronic kidney disease, or unspecified chronic kidney disease: Secondary | ICD-10-CM | POA: Diagnosis not present

## 2019-08-02 DIAGNOSIS — M9711XD Periprosthetic fracture around internal prosthetic right knee joint, subsequent encounter: Secondary | ICD-10-CM | POA: Diagnosis not present

## 2019-08-02 DIAGNOSIS — M80061D Age-related osteoporosis with current pathological fracture, right lower leg, subsequent encounter for fracture with routine healing: Secondary | ICD-10-CM | POA: Diagnosis not present

## 2019-08-02 DIAGNOSIS — I129 Hypertensive chronic kidney disease with stage 1 through stage 4 chronic kidney disease, or unspecified chronic kidney disease: Secondary | ICD-10-CM | POA: Diagnosis not present

## 2019-08-02 DIAGNOSIS — E1122 Type 2 diabetes mellitus with diabetic chronic kidney disease: Secondary | ICD-10-CM | POA: Diagnosis not present

## 2019-08-02 DIAGNOSIS — I350 Nonrheumatic aortic (valve) stenosis: Secondary | ICD-10-CM | POA: Diagnosis not present

## 2019-08-02 DIAGNOSIS — N189 Chronic kidney disease, unspecified: Secondary | ICD-10-CM | POA: Diagnosis not present

## 2019-08-06 DIAGNOSIS — M9711XD Periprosthetic fracture around internal prosthetic right knee joint, subsequent encounter: Secondary | ICD-10-CM | POA: Diagnosis not present

## 2019-08-06 DIAGNOSIS — I350 Nonrheumatic aortic (valve) stenosis: Secondary | ICD-10-CM | POA: Diagnosis not present

## 2019-08-06 DIAGNOSIS — N189 Chronic kidney disease, unspecified: Secondary | ICD-10-CM | POA: Diagnosis not present

## 2019-08-06 DIAGNOSIS — I129 Hypertensive chronic kidney disease with stage 1 through stage 4 chronic kidney disease, or unspecified chronic kidney disease: Secondary | ICD-10-CM | POA: Diagnosis not present

## 2019-08-06 DIAGNOSIS — E1122 Type 2 diabetes mellitus with diabetic chronic kidney disease: Secondary | ICD-10-CM | POA: Diagnosis not present

## 2019-08-06 DIAGNOSIS — M80061D Age-related osteoporosis with current pathological fracture, right lower leg, subsequent encounter for fracture with routine healing: Secondary | ICD-10-CM | POA: Diagnosis not present

## 2019-08-08 DIAGNOSIS — M9711XD Periprosthetic fracture around internal prosthetic right knee joint, subsequent encounter: Secondary | ICD-10-CM | POA: Diagnosis not present

## 2019-08-08 DIAGNOSIS — I350 Nonrheumatic aortic (valve) stenosis: Secondary | ICD-10-CM | POA: Diagnosis not present

## 2019-08-08 DIAGNOSIS — I129 Hypertensive chronic kidney disease with stage 1 through stage 4 chronic kidney disease, or unspecified chronic kidney disease: Secondary | ICD-10-CM | POA: Diagnosis not present

## 2019-08-08 DIAGNOSIS — M80061D Age-related osteoporosis with current pathological fracture, right lower leg, subsequent encounter for fracture with routine healing: Secondary | ICD-10-CM | POA: Diagnosis not present

## 2019-08-08 DIAGNOSIS — E1122 Type 2 diabetes mellitus with diabetic chronic kidney disease: Secondary | ICD-10-CM | POA: Diagnosis not present

## 2019-08-08 DIAGNOSIS — N189 Chronic kidney disease, unspecified: Secondary | ICD-10-CM | POA: Diagnosis not present

## 2019-08-09 ENCOUNTER — Encounter: Payer: Self-pay | Admitting: Specialist

## 2019-08-09 ENCOUNTER — Ambulatory Visit (INDEPENDENT_AMBULATORY_CARE_PROVIDER_SITE_OTHER): Payer: Medicare Other

## 2019-08-09 ENCOUNTER — Ambulatory Visit (INDEPENDENT_AMBULATORY_CARE_PROVIDER_SITE_OTHER): Payer: Medicare Other | Admitting: Specialist

## 2019-08-09 VITALS — BP 142/71 | HR 84 | Ht 66.0 in | Wt 264.0 lb

## 2019-08-09 DIAGNOSIS — I350 Nonrheumatic aortic (valve) stenosis: Secondary | ICD-10-CM | POA: Diagnosis not present

## 2019-08-09 DIAGNOSIS — M80061D Age-related osteoporosis with current pathological fracture, right lower leg, subsequent encounter for fracture with routine healing: Secondary | ICD-10-CM | POA: Diagnosis not present

## 2019-08-09 DIAGNOSIS — E1122 Type 2 diabetes mellitus with diabetic chronic kidney disease: Secondary | ICD-10-CM | POA: Diagnosis not present

## 2019-08-09 DIAGNOSIS — I129 Hypertensive chronic kidney disease with stage 1 through stage 4 chronic kidney disease, or unspecified chronic kidney disease: Secondary | ICD-10-CM | POA: Diagnosis not present

## 2019-08-09 DIAGNOSIS — S82032G Displaced transverse fracture of left patella, subsequent encounter for closed fracture with delayed healing: Secondary | ICD-10-CM | POA: Diagnosis not present

## 2019-08-09 DIAGNOSIS — M9711XD Periprosthetic fracture around internal prosthetic right knee joint, subsequent encounter: Secondary | ICD-10-CM | POA: Diagnosis not present

## 2019-08-09 DIAGNOSIS — N189 Chronic kidney disease, unspecified: Secondary | ICD-10-CM | POA: Diagnosis not present

## 2019-08-09 NOTE — Progress Notes (Signed)
Post-Op Visit Note   Patient: Autumn Chambers           Date of Birth: 1941/09/27           MRN: [id number] Visit Date: 08/09/2019 PCP: Kathyrn Lass, MD   Assessment & Plan: 8 weeks post right Total knee replacement ORIF of patella fracture.   Chief Complaint:  Chief Complaint  Patient presents with  . Right Knee - Routine Post Op   Visit Diagnoses:  1. Displaced transverse fracture of left patella, subsequent encounter for closed fracture with delayed healing   Incision is healed, mild right knee effusion and swelling. Not tender to palpation. 0-60 degrees ROM today with increasing the hinge motion.  Xray: AP and lateral right knee show right TKR in good position and alignment. There is flexion of the transverse patella fracture site but good bone on bone positioning of the major fragments. The  Angulation does not appear to cause bone on prosethesis articulation and the fragments are close enough within 7 mm that it is acceptable to continue with conservative treatment.    Plan: The right knee bledsoe brace is opened at the hinge to allow for 0-60 degrees of ROM. Recommend PT to work on active ROM of the right knee 0-30 degrees. Brace stays on but she can remove the brace when she is  In shower and sitting on a tub seat.  Return in 1 weeks for increasing the ROM to 0-90 degrees and then 4 weeks for removal of the brace. Stay on aspirin for blood thinner. Baby aspirin   Follow-Up Instructions: No follow-ups on file.   Orders:  Orders Placed This Encounter  Procedures  . XR Knee 1-2 Views Right   No orders of the defined types were placed in this encounter.   Imaging: No results found.  PMFS History: Patient Active Problem List   Diagnosis Date Noted  . Closed displaced fracture of right patella 06/09/2019  . Idiopathic chronic gout, unspecified site, without tophus (tophi) 12/12/2016  . Dyspnea 06/26/2015  . Angina pectoris ([place]) 06/26/2015  . Former smoker  06/26/2015  . Obesity 06/26/2015  . Type 2 diabetes mellitus without complication ([place]) 123456  . OSA (obstructive sleep apnea) 06/26/2015  . Aortic stenosis 06/26/2015  . Breast cancer of upper-inner quadrant of left female breast ([place]) 09/05/2013  . Osteoporosis 08/22/2013  . GERD (gastroesophageal reflux disease) 08/22/2013  . Hypothyroidism 08/22/2013  . Anxiety 08/22/2013  . Hypertension 08/22/2013  . Diabetes due to undrl condition w oth diabetic kidney comp ([place]) 08/22/2013  . Osteoarthritis of left knee 08/02/2013    Class: Diagnosis of   Past Medical History:  Diagnosis Date  . Anemia   . Arthritis   . Cancer ([place])    hx of skin ca  . Chronic kidney disease    CRI- sees Dr Posey Pronto  . Diabetes mellitus    oral  . Gout   . H/O hiatal hernia   . Hearing loss   . Hyperlipidemia   . Hypertension   . Hypothyroidism   . Irritable bowel   . Osteoporosis 02/2013   T score -2.9  . PONV (postoperative nausea and vomiting)    yearsago  . Shortness of breath    "mild upon exertion"  . Urinary tract infection    hx of    Family History  Problem Relation Age of Onset  . Cancer Father        Prostate  . Cancer Sister  Pancreatic cancer  . Cancer Brother        Brain cancer  . Hypertension Brother   . Heart disease Mother     Past Surgical History:  Procedure Laterality Date  . ACHILLES TENDON SURGERY    . BREAST LUMPECTOMY     left, with 2 nodes removed  . DILATION AND CURETTAGE OF UTERUS    . EYE SURGERY     bilateral cataract surgery  . ORIF PATELLA Right 06/11/2019   Procedure: OPEN REDUCTION INTERNAL (ORIF) FIXATION RIGHT PATELLA FRACTURE WITH PINS, WIRE, CANNULATED SCREWS, CERCLAGE WIRE PATELLA TO TIBIA;  Surgeon: Jessy Oto, MD;  [facility];  Service: Orthopedics;  Laterality: Right;  . SKIN CANCER EXCISION     approx 4 years ago.  Nose  . TONSILLECTOMY    . TOTAL KNEE ARTHROPLASTY  01/04/2012   Procedure: TOTAL KNEE ARTHROPLASTY;   Surgeon: Newt Minion, MD;  Location: [place];  Service: Orthopedics;  Laterality: Right;  Right Total Knee Arthroplasty  . TOTAL KNEE ARTHROPLASTY Left 07/31/2013   Procedure: LEFT TOTAL KNEE ARTHROPLASTY;  Surgeon: Newt Minion, MD;  Location: [place];  Service: Orthopedics;  Laterality: Left;  Left Total Knee Arthroplasty   Social History   Occupational History  . Not on file  Tobacco Use  . Smoking status: Former Smoker    Years: 40.00    Types: Cigarettes  . Smokeless tobacco: Never Used  . Tobacco comment: stopped smoking 2009  Substance and Sexual Activity  . Alcohol use: No  . Drug use: No  . Sexual activity: Not on file

## 2019-08-09 NOTE — Patient Instructions (Signed)
Plan: The right knee bledsoe brace is opened at the hinge to allow for 0-60 degrees of ROM. Recommend PT to work on active ROM of the right knee 0-30 degrees. Brace stays on but she can remove the brace when she is  In shower and sitting on a tub seat.  Return in 1 weeks for increasing the ROM to 0-90 degrees and then 4 weeks for removal of the brace. Stay on aspirin for blood thinner. Baby aspirin

## 2019-08-13 DIAGNOSIS — N189 Chronic kidney disease, unspecified: Secondary | ICD-10-CM | POA: Diagnosis not present

## 2019-08-13 DIAGNOSIS — I350 Nonrheumatic aortic (valve) stenosis: Secondary | ICD-10-CM | POA: Diagnosis not present

## 2019-08-13 DIAGNOSIS — I129 Hypertensive chronic kidney disease with stage 1 through stage 4 chronic kidney disease, or unspecified chronic kidney disease: Secondary | ICD-10-CM | POA: Diagnosis not present

## 2019-08-13 DIAGNOSIS — E1122 Type 2 diabetes mellitus with diabetic chronic kidney disease: Secondary | ICD-10-CM | POA: Diagnosis not present

## 2019-08-13 DIAGNOSIS — M9711XD Periprosthetic fracture around internal prosthetic right knee joint, subsequent encounter: Secondary | ICD-10-CM | POA: Diagnosis not present

## 2019-08-13 DIAGNOSIS — M80061D Age-related osteoporosis with current pathological fracture, right lower leg, subsequent encounter for fracture with routine healing: Secondary | ICD-10-CM | POA: Diagnosis not present

## 2019-08-14 ENCOUNTER — Ambulatory Visit: Payer: BLUE CROSS/BLUE SHIELD | Admitting: Podiatry

## 2019-08-15 DIAGNOSIS — N189 Chronic kidney disease, unspecified: Secondary | ICD-10-CM | POA: Diagnosis not present

## 2019-08-15 DIAGNOSIS — I129 Hypertensive chronic kidney disease with stage 1 through stage 4 chronic kidney disease, or unspecified chronic kidney disease: Secondary | ICD-10-CM | POA: Diagnosis not present

## 2019-08-15 DIAGNOSIS — I350 Nonrheumatic aortic (valve) stenosis: Secondary | ICD-10-CM | POA: Diagnosis not present

## 2019-08-15 DIAGNOSIS — M80061D Age-related osteoporosis with current pathological fracture, right lower leg, subsequent encounter for fracture with routine healing: Secondary | ICD-10-CM | POA: Diagnosis not present

## 2019-08-15 DIAGNOSIS — M9711XD Periprosthetic fracture around internal prosthetic right knee joint, subsequent encounter: Secondary | ICD-10-CM | POA: Diagnosis not present

## 2019-08-15 DIAGNOSIS — E1122 Type 2 diabetes mellitus with diabetic chronic kidney disease: Secondary | ICD-10-CM | POA: Diagnosis not present

## 2019-08-16 ENCOUNTER — Ambulatory Visit (INDEPENDENT_AMBULATORY_CARE_PROVIDER_SITE_OTHER): Payer: Medicare Other | Admitting: Specialist

## 2019-08-16 ENCOUNTER — Encounter: Payer: Self-pay | Admitting: Specialist

## 2019-08-16 ENCOUNTER — Ambulatory Visit (INDEPENDENT_AMBULATORY_CARE_PROVIDER_SITE_OTHER): Payer: Medicare Other

## 2019-08-16 ENCOUNTER — Other Ambulatory Visit: Payer: Self-pay

## 2019-08-16 VITALS — BP 145/75 | HR 87 | Ht 66.0 in | Wt 264.0 lb

## 2019-08-16 DIAGNOSIS — S82032G Displaced transverse fracture of left patella, subsequent encounter for closed fracture with delayed healing: Secondary | ICD-10-CM

## 2019-08-16 DIAGNOSIS — S82034A Nondisplaced transverse fracture of right patella, initial encounter for closed fracture: Secondary | ICD-10-CM

## 2019-08-16 DIAGNOSIS — S82032K Displaced transverse fracture of left patella, subsequent encounter for closed fracture with nonunion: Secondary | ICD-10-CM

## 2019-08-16 NOTE — Progress Notes (Signed)
Office Visit Note   Patient: Autumn Chambers           Date of Birth: [birth year]           MRN: [id number] Visit Date: 08/16/2019              Requested by: Kathyrn Lass, [place],  [place] [address] PCP: Kathyrn Lass, MD   Assessment & Plan: Visit Diagnoses:  1. Displaced transverse fracture of left patella, subsequent encounter for closed fracture with delayed healing   2. Closed nondisplaced transverse fracture of right patella, initial encounter   3. Closed displaced transverse fracture of left patella with nonunion, subsequent encounter     Plan: X-rays reviewed with patient today.  Since last visit/x-ray there has been about 3 to 4 mm separation of the fracture.  Advised patient that I do not recommend opening up her brace to 90 degrees as previously discussed last office visit.  I am concerned at this point regarding the healing.  I think that patient may do well with a bone stimulator and this would be of great benefit.  We will see about getting this approved.  Patient will follow-up in 2 weeks for recheck with repeat x-ray.  Follow-Up Instructions: Return in about 2 weeks (around 08/30/2019).   Orders:  Orders Placed This Encounter  Procedures  . XR Knee 1-2 Views Right   No orders of the defined types were placed in this encounter.     Procedures: No procedures performed   Clinical Data: No additional findings.   Subjective: Chief Complaint  Patient presents with  . Right Knee - Follow-up    Here to see if she can get extend the bend on her brace    HPI 78 year old white female who is status post ORIF right patella fracture June 11, 2019 returns.  Date of injury June 09, 2019 with x-ray confirming displaced transverse patella fracture that day.  Patient has been working with physical therapy.  Bledsoe brace locked at 60 degrees.   Objective: Vital Signs: BP (!) 145/75 (BP Location: Left Arm, Patient Position: Sitting)   Pulse 87    Ht 5\' 6"  (1.676 m)   Wt 264 lb (119.7 kg)   BMI 42.61 kg/m   Physical Exam  Ortho Exam  Specialty Comments:  No specialty comments available.  Imaging: No results found.   PMFS History: Patient Active Problem List   Diagnosis Date Noted  . Closed displaced fracture of right patella 06/09/2019  . Idiopathic chronic gout, unspecified site, without tophus (tophi) 12/12/2016  . Dyspnea 06/26/2015  . Angina pectoris ([place]) 06/26/2015  . Former smoker 06/26/2015  . Obesity 06/26/2015  . Type 2 diabetes mellitus without complication ([place]) 123456  . OSA (obstructive sleep apnea) 06/26/2015  . Aortic stenosis 06/26/2015  . Breast cancer of upper-inner quadrant of left female breast ([place]) 09/05/2013  . Osteoporosis 08/22/2013  . GERD (gastroesophageal reflux disease) 08/22/2013  . Hypothyroidism 08/22/2013  . Anxiety 08/22/2013  . Hypertension 08/22/2013  . Diabetes due to undrl condition w oth diabetic kidney comp ([place]) 08/22/2013  . Osteoarthritis of left knee 08/02/2013    Class: Diagnosis of   Past Medical History:  Diagnosis Date  . Anemia   . Arthritis   . Cancer ([place])    hx of skin ca  . Chronic kidney disease    CRI- sees Dr Posey Pronto  . Diabetes mellitus    oral  . Gout   . H/O  hiatal hernia   . Hearing loss   . Hyperlipidemia   . Hypertension   . Hypothyroidism   . Irritable bowel   . Osteoporosis 02/2013   T score -2.9  . PONV (postoperative nausea and vomiting)    yearsago  . Shortness of breath    "mild upon exertion"  . Urinary tract infection    hx of    Family History  Problem Relation Age of Onset  . Cancer Father        Prostate  . Cancer Sister        Pancreatic cancer  . Cancer Brother        Brain cancer  . Hypertension Brother   . Heart disease Mother     Past Surgical History:  Procedure Laterality Date  . ACHILLES TENDON SURGERY    . BREAST LUMPECTOMY     left, with 2 nodes removed  . DILATION AND CURETTAGE OF UTERUS    .  EYE SURGERY     bilateral cataract surgery  . ORIF PATELLA Right 06/11/2019   Procedure: OPEN REDUCTION INTERNAL (ORIF) FIXATION RIGHT PATELLA FRACTURE WITH PINS, WIRE, CANNULATED SCREWS, CERCLAGE WIRE PATELLA TO TIBIA;  Surgeon: Jessy Oto, MD;  [facility];  Service: Orthopedics;  Laterality: Right;  . SKIN CANCER EXCISION     approx 4 years ago.  Nose  . TONSILLECTOMY    . TOTAL KNEE ARTHROPLASTY  01/04/2012   Procedure: TOTAL KNEE ARTHROPLASTY;  Surgeon: Newt Minion, MD;  Location: [place];  Service: Orthopedics;  Laterality: Right;  Right Total Knee Arthroplasty  . TOTAL KNEE ARTHROPLASTY Left 07/31/2013   Procedure: LEFT TOTAL KNEE ARTHROPLASTY;  Surgeon: Newt Minion, MD;  Location: [place];  Service: Orthopedics;  Laterality: Left;  Left Total Knee Arthroplasty   Social History   Occupational History  . Not on file  Tobacco Use  . Smoking status: Former Smoker    Years: 40.00    Types: Cigarettes  . Smokeless tobacco: Never Used  . Tobacco comment: stopped smoking 2009  Substance and Sexual Activity  . Alcohol use: No  . Drug use: No  . Sexual activity: Not on file

## 2019-08-17 DIAGNOSIS — M9711XD Periprosthetic fracture around internal prosthetic right knee joint, subsequent encounter: Secondary | ICD-10-CM | POA: Diagnosis not present

## 2019-08-17 DIAGNOSIS — Z7984 Long term (current) use of oral hypoglycemic drugs: Secondary | ICD-10-CM | POA: Diagnosis not present

## 2019-08-17 DIAGNOSIS — E039 Hypothyroidism, unspecified: Secondary | ICD-10-CM | POA: Diagnosis not present

## 2019-08-17 DIAGNOSIS — I129 Hypertensive chronic kidney disease with stage 1 through stage 4 chronic kidney disease, or unspecified chronic kidney disease: Secondary | ICD-10-CM | POA: Diagnosis not present

## 2019-08-17 DIAGNOSIS — E1122 Type 2 diabetes mellitus with diabetic chronic kidney disease: Secondary | ICD-10-CM | POA: Diagnosis not present

## 2019-08-17 DIAGNOSIS — I350 Nonrheumatic aortic (valve) stenosis: Secondary | ICD-10-CM | POA: Diagnosis not present

## 2019-08-17 DIAGNOSIS — Z6835 Body mass index (BMI) 35.0-35.9, adult: Secondary | ICD-10-CM | POA: Diagnosis not present

## 2019-08-17 DIAGNOSIS — D631 Anemia in chronic kidney disease: Secondary | ICD-10-CM | POA: Diagnosis not present

## 2019-08-17 DIAGNOSIS — E669 Obesity, unspecified: Secondary | ICD-10-CM | POA: Diagnosis not present

## 2019-08-17 DIAGNOSIS — M159 Polyosteoarthritis, unspecified: Secondary | ICD-10-CM | POA: Diagnosis not present

## 2019-08-17 DIAGNOSIS — M109 Gout, unspecified: Secondary | ICD-10-CM | POA: Diagnosis not present

## 2019-08-17 DIAGNOSIS — M80061D Age-related osteoporosis with current pathological fracture, right lower leg, subsequent encounter for fracture with routine healing: Secondary | ICD-10-CM | POA: Diagnosis not present

## 2019-08-17 DIAGNOSIS — M6281 Muscle weakness (generalized): Secondary | ICD-10-CM | POA: Diagnosis not present

## 2019-08-17 DIAGNOSIS — Z96653 Presence of artificial knee joint, bilateral: Secondary | ICD-10-CM | POA: Diagnosis not present

## 2019-08-17 DIAGNOSIS — Z9181 History of falling: Secondary | ICD-10-CM | POA: Diagnosis not present

## 2019-08-17 DIAGNOSIS — H409 Unspecified glaucoma: Secondary | ICD-10-CM | POA: Diagnosis not present

## 2019-08-17 DIAGNOSIS — N189 Chronic kidney disease, unspecified: Secondary | ICD-10-CM | POA: Diagnosis not present

## 2019-08-17 DIAGNOSIS — Z87891 Personal history of nicotine dependence: Secondary | ICD-10-CM | POA: Diagnosis not present

## 2019-08-19 ENCOUNTER — Ambulatory Visit: Payer: Medicare Other | Admitting: Specialist

## 2019-08-21 DIAGNOSIS — E1122 Type 2 diabetes mellitus with diabetic chronic kidney disease: Secondary | ICD-10-CM | POA: Diagnosis not present

## 2019-08-21 DIAGNOSIS — N189 Chronic kidney disease, unspecified: Secondary | ICD-10-CM | POA: Diagnosis not present

## 2019-08-21 DIAGNOSIS — M9711XD Periprosthetic fracture around internal prosthetic right knee joint, subsequent encounter: Secondary | ICD-10-CM | POA: Diagnosis not present

## 2019-08-21 DIAGNOSIS — I350 Nonrheumatic aortic (valve) stenosis: Secondary | ICD-10-CM | POA: Diagnosis not present

## 2019-08-21 DIAGNOSIS — I129 Hypertensive chronic kidney disease with stage 1 through stage 4 chronic kidney disease, or unspecified chronic kidney disease: Secondary | ICD-10-CM | POA: Diagnosis not present

## 2019-08-21 DIAGNOSIS — M80061D Age-related osteoporosis with current pathological fracture, right lower leg, subsequent encounter for fracture with routine healing: Secondary | ICD-10-CM | POA: Diagnosis not present

## 2019-08-23 DIAGNOSIS — I129 Hypertensive chronic kidney disease with stage 1 through stage 4 chronic kidney disease, or unspecified chronic kidney disease: Secondary | ICD-10-CM | POA: Diagnosis not present

## 2019-08-23 DIAGNOSIS — M9711XD Periprosthetic fracture around internal prosthetic right knee joint, subsequent encounter: Secondary | ICD-10-CM | POA: Diagnosis not present

## 2019-08-23 DIAGNOSIS — M80061D Age-related osteoporosis with current pathological fracture, right lower leg, subsequent encounter for fracture with routine healing: Secondary | ICD-10-CM | POA: Diagnosis not present

## 2019-08-23 DIAGNOSIS — I350 Nonrheumatic aortic (valve) stenosis: Secondary | ICD-10-CM | POA: Diagnosis not present

## 2019-08-23 DIAGNOSIS — E1122 Type 2 diabetes mellitus with diabetic chronic kidney disease: Secondary | ICD-10-CM | POA: Diagnosis not present

## 2019-08-23 DIAGNOSIS — N189 Chronic kidney disease, unspecified: Secondary | ICD-10-CM | POA: Diagnosis not present

## 2019-08-26 DIAGNOSIS — E1122 Type 2 diabetes mellitus with diabetic chronic kidney disease: Secondary | ICD-10-CM | POA: Diagnosis not present

## 2019-08-26 DIAGNOSIS — I350 Nonrheumatic aortic (valve) stenosis: Secondary | ICD-10-CM | POA: Diagnosis not present

## 2019-08-26 DIAGNOSIS — N189 Chronic kidney disease, unspecified: Secondary | ICD-10-CM | POA: Diagnosis not present

## 2019-08-26 DIAGNOSIS — M80061D Age-related osteoporosis with current pathological fracture, right lower leg, subsequent encounter for fracture with routine healing: Secondary | ICD-10-CM | POA: Diagnosis not present

## 2019-08-26 DIAGNOSIS — I129 Hypertensive chronic kidney disease with stage 1 through stage 4 chronic kidney disease, or unspecified chronic kidney disease: Secondary | ICD-10-CM | POA: Diagnosis not present

## 2019-08-26 DIAGNOSIS — M9711XD Periprosthetic fracture around internal prosthetic right knee joint, subsequent encounter: Secondary | ICD-10-CM | POA: Diagnosis not present

## 2019-08-29 ENCOUNTER — Ambulatory Visit (INDEPENDENT_AMBULATORY_CARE_PROVIDER_SITE_OTHER): Payer: Medicare Other | Admitting: Surgery

## 2019-08-29 ENCOUNTER — Ambulatory Visit: Payer: Self-pay

## 2019-08-29 ENCOUNTER — Other Ambulatory Visit: Payer: Self-pay

## 2019-08-29 ENCOUNTER — Encounter: Payer: Self-pay | Admitting: Surgery

## 2019-08-29 VITALS — Ht 66.0 in | Wt 264.0 lb

## 2019-08-29 DIAGNOSIS — S82031G Displaced transverse fracture of right patella, subsequent encounter for closed fracture with delayed healing: Secondary | ICD-10-CM

## 2019-08-29 DIAGNOSIS — S82032G Displaced transverse fracture of left patella, subsequent encounter for closed fracture with delayed healing: Secondary | ICD-10-CM

## 2019-08-29 NOTE — Progress Notes (Signed)
78 year old white female who is status post ORIF right patella fracture June 11, 2019 returns.  Date of injury June 09, 2019 with x-ray confirming displaced transverse patella fracture that day.  Patient has been working with physical therapy and not trying to overdo it..  Bledsoe brace locked at 60 degrees.  States that she is doing well.  Has been compliant with wearing her brace.  X-rays today unchanged from previous films.  No further displacement.  No significant callus formation.  Plan Patient will continue with her brace locked where it is.  Okay to continue PT.  I will have her follow-up in the office November 9 for recheck with repeat x-ray.  I will also see about getting the bone stimulator rep to meet patient in the clinic that same day to start treatment.  All questions answered.

## 2019-08-30 DIAGNOSIS — I350 Nonrheumatic aortic (valve) stenosis: Secondary | ICD-10-CM | POA: Diagnosis not present

## 2019-08-30 DIAGNOSIS — I129 Hypertensive chronic kidney disease with stage 1 through stage 4 chronic kidney disease, or unspecified chronic kidney disease: Secondary | ICD-10-CM | POA: Diagnosis not present

## 2019-08-30 DIAGNOSIS — N189 Chronic kidney disease, unspecified: Secondary | ICD-10-CM | POA: Diagnosis not present

## 2019-08-30 DIAGNOSIS — M9711XD Periprosthetic fracture around internal prosthetic right knee joint, subsequent encounter: Secondary | ICD-10-CM | POA: Diagnosis not present

## 2019-08-30 DIAGNOSIS — E1122 Type 2 diabetes mellitus with diabetic chronic kidney disease: Secondary | ICD-10-CM | POA: Diagnosis not present

## 2019-08-30 DIAGNOSIS — M80061D Age-related osteoporosis with current pathological fracture, right lower leg, subsequent encounter for fracture with routine healing: Secondary | ICD-10-CM | POA: Diagnosis not present

## 2019-09-04 DIAGNOSIS — Z23 Encounter for immunization: Secondary | ICD-10-CM | POA: Diagnosis not present

## 2019-09-05 ENCOUNTER — Telehealth: Payer: Self-pay | Admitting: Specialist

## 2019-09-05 NOTE — Telephone Encounter (Signed)
Shanon Brow, a physical therapist with [facility] called. He is needing orders to extend her therapy due to slow healing of her fracture. He would like to go back next week and reassess her. He would then like to extend her therapy to once weekly for 3 weeks to increase her ROM. Call back for Shanon Brow [phone]. Thanks!

## 2019-09-05 NOTE — Telephone Encounter (Signed)
I called and gave verbon VM

## 2019-09-06 DIAGNOSIS — N189 Chronic kidney disease, unspecified: Secondary | ICD-10-CM | POA: Diagnosis not present

## 2019-09-06 DIAGNOSIS — M9711XD Periprosthetic fracture around internal prosthetic right knee joint, subsequent encounter: Secondary | ICD-10-CM | POA: Diagnosis not present

## 2019-09-06 DIAGNOSIS — I350 Nonrheumatic aortic (valve) stenosis: Secondary | ICD-10-CM | POA: Diagnosis not present

## 2019-09-06 DIAGNOSIS — E1122 Type 2 diabetes mellitus with diabetic chronic kidney disease: Secondary | ICD-10-CM | POA: Diagnosis not present

## 2019-09-06 DIAGNOSIS — I129 Hypertensive chronic kidney disease with stage 1 through stage 4 chronic kidney disease, or unspecified chronic kidney disease: Secondary | ICD-10-CM | POA: Diagnosis not present

## 2019-09-06 DIAGNOSIS — M80061D Age-related osteoporosis with current pathological fracture, right lower leg, subsequent encounter for fracture with routine healing: Secondary | ICD-10-CM | POA: Diagnosis not present

## 2019-09-11 ENCOUNTER — Ambulatory Visit: Payer: Medicare Other | Admitting: Podiatry

## 2019-09-11 ENCOUNTER — Ambulatory Visit (INDEPENDENT_AMBULATORY_CARE_PROVIDER_SITE_OTHER): Payer: Medicare Other

## 2019-09-11 ENCOUNTER — Other Ambulatory Visit: Payer: Self-pay

## 2019-09-11 ENCOUNTER — Ambulatory Visit (INDEPENDENT_AMBULATORY_CARE_PROVIDER_SITE_OTHER): Payer: Medicare Other | Admitting: Surgery

## 2019-09-11 ENCOUNTER — Encounter: Payer: Self-pay | Admitting: Surgery

## 2019-09-11 VITALS — Ht 66.0 in | Wt 264.0 lb

## 2019-09-11 DIAGNOSIS — S82031K Displaced transverse fracture of right patella, subsequent encounter for closed fracture with nonunion: Secondary | ICD-10-CM

## 2019-09-11 DIAGNOSIS — S82034A Nondisplaced transverse fracture of right patella, initial encounter for closed fracture: Secondary | ICD-10-CM

## 2019-09-11 NOTE — Progress Notes (Addendum)
Office Visit Note   Patient: Autumn Chambers           Date of Birth: 1941/10/19           MRN: [id number] Visit Date: 09/11/2019              Requested by: Kathyrn Lass, [place],  [place] [address] PCP: Kathyrn Lass, MD   Assessment & Plan: Visit Diagnoses:  1. Closed nondisplaced transverse fracture of right patella, initial encounter   2. Closed displaced transverse fracture of right patella with nonunion, subsequent encounter     Plan: Patient will continue with her Bledsoe brace.  I did not open it up more.  Stressed her the importance of continuing to be compliant.  We will plan to get approval to start using the bone stimulator.  We will have patient follow-up with Dr. Louanne Skye in 5 weeks for recheck and we will get new x-ray at that time.  He will make decision at that time as to whether or not less of brace can be opened up.  Follow-Up Instructions: Return in about 5 weeks (around 10/16/2019) for WITH DR NITKA.   Orders:  Orders Placed This Encounter  Procedures  . XR Knee 1-2 Views Right   No orders of the defined types were placed in this encounter.     Procedures: No procedures performed   Clinical Data: No additional findings.   Subjective: Chief Complaint  Patient presents with  . Right Knee - Routine Post Op    HPI Patient returns.  She has been compliant with wearing her brace.  Comes in for repeat x-ray.   Objective: Vital Signs: Ht 5\' 6"  (1.676 m)   Wt 264 lb (119.7 kg)   BMI 42.61 kg/m   Physical Exam Very pleasant white female in no acute distress.  Continues have some tenderness over the patella. Ortho Exam  Specialty Comments:  No specialty comments available.  Imaging: Xr Knee 1-2 Views Right  Result Date: 09/12/2019 X-rays right knee today multiple views again show displaced patella nonunion.  On lateral view anterior to posterior patella fracture gap measurements about 9.18, 6.5, and 3.5 mm.  This is  unchanged from previous x-ray.    PMFS History: Patient Active Problem List   Diagnosis Date Noted  . Closed displaced fracture of right patella 06/09/2019  . Idiopathic chronic gout, unspecified site, without tophus (tophi) 12/12/2016  . Dyspnea 06/26/2015  . Angina pectoris ([place]) 06/26/2015  . Former smoker 06/26/2015  . Obesity 06/26/2015  . Type 2 diabetes mellitus without complication ([place]) 123456  . OSA (obstructive sleep apnea) 06/26/2015  . Aortic stenosis 06/26/2015  . Breast cancer of upper-inner quadrant of left female breast ([place]) 09/05/2013  . Osteoporosis 08/22/2013  . GERD (gastroesophageal reflux disease) 08/22/2013  . Hypothyroidism 08/22/2013  . Anxiety 08/22/2013  . Hypertension 08/22/2013  . Diabetes due to undrl condition w oth diabetic kidney comp ([place]) 08/22/2013  . Osteoarthritis of left knee 08/02/2013    Class: Diagnosis of   Past Medical History:  Diagnosis Date  . Anemia   . Arthritis   . Cancer ([place])    hx of skin ca  . Chronic kidney disease    CRI- sees Dr Posey Pronto  . Diabetes mellitus    oral  . Gout   . H/O hiatal hernia   . Hearing loss   . Hyperlipidemia   . Hypertension   . Hypothyroidism   . Irritable bowel   .  Osteoporosis 02/2013   T score -2.9  . PONV (postoperative nausea and vomiting)    yearsago  . Shortness of breath    "mild upon exertion"  . Urinary tract infection    hx of    Family History  Problem Relation Age of Onset  . Cancer Father        Prostate  . Cancer Sister        Pancreatic cancer  . Cancer Brother        Brain cancer  . Hypertension Brother   . Heart disease Mother     Past Surgical History:  Procedure Laterality Date  . ACHILLES TENDON SURGERY    . BREAST LUMPECTOMY     left, with 2 nodes removed  . DILATION AND CURETTAGE OF UTERUS    . EYE SURGERY     bilateral cataract surgery  . ORIF PATELLA Right 06/11/2019   Procedure: OPEN REDUCTION INTERNAL (ORIF) FIXATION RIGHT PATELLA  FRACTURE WITH PINS, WIRE, CANNULATED SCREWS, CERCLAGE WIRE PATELLA TO TIBIA;  Surgeon: Jessy Oto, MD;  Location: [facility];  Service: Orthopedics;  Laterality: Right;  . SKIN CANCER EXCISION     approx 4 years ago.  Nose  . TONSILLECTOMY    . TOTAL KNEE ARTHROPLASTY  01/04/2012   Procedure: TOTAL KNEE ARTHROPLASTY;  Surgeon: Newt Minion, MD;  Location: [facility];  Service: Orthopedics;  Laterality: Right;  Right Total Knee Arthroplasty  . TOTAL KNEE ARTHROPLASTY Left 07/31/2013   Procedure: LEFT TOTAL KNEE ARTHROPLASTY;  Surgeon: Newt Minion, MD;  Location: [facility];  Service: Orthopedics;  Laterality: Left;  Left Total Knee Arthroplasty   Social History   Occupational History  . Not on file  Tobacco Use  . Smoking status: Former Smoker    Years: 40.00    Types: Cigarettes  . Smokeless tobacco: Never Used  . Tobacco comment: stopped smoking 2009  Substance and Sexual Activity  . Alcohol use: No  . Drug use: No  . Sexual activity: Not on file

## 2019-09-13 DIAGNOSIS — I129 Hypertensive chronic kidney disease with stage 1 through stage 4 chronic kidney disease, or unspecified chronic kidney disease: Secondary | ICD-10-CM | POA: Diagnosis not present

## 2019-09-13 DIAGNOSIS — E1122 Type 2 diabetes mellitus with diabetic chronic kidney disease: Secondary | ICD-10-CM | POA: Diagnosis not present

## 2019-09-13 DIAGNOSIS — M80061D Age-related osteoporosis with current pathological fracture, right lower leg, subsequent encounter for fracture with routine healing: Secondary | ICD-10-CM | POA: Diagnosis not present

## 2019-09-13 DIAGNOSIS — I350 Nonrheumatic aortic (valve) stenosis: Secondary | ICD-10-CM | POA: Diagnosis not present

## 2019-09-13 DIAGNOSIS — N189 Chronic kidney disease, unspecified: Secondary | ICD-10-CM | POA: Diagnosis not present

## 2019-09-13 DIAGNOSIS — M9711XD Periprosthetic fracture around internal prosthetic right knee joint, subsequent encounter: Secondary | ICD-10-CM | POA: Diagnosis not present

## 2019-09-16 DIAGNOSIS — H409 Unspecified glaucoma: Secondary | ICD-10-CM | POA: Diagnosis not present

## 2019-09-16 DIAGNOSIS — Z9181 History of falling: Secondary | ICD-10-CM | POA: Diagnosis not present

## 2019-09-16 DIAGNOSIS — Z7984 Long term (current) use of oral hypoglycemic drugs: Secondary | ICD-10-CM | POA: Diagnosis not present

## 2019-09-16 DIAGNOSIS — M9711XD Periprosthetic fracture around internal prosthetic right knee joint, subsequent encounter: Secondary | ICD-10-CM | POA: Diagnosis not present

## 2019-09-16 DIAGNOSIS — E039 Hypothyroidism, unspecified: Secondary | ICD-10-CM | POA: Diagnosis not present

## 2019-09-16 DIAGNOSIS — E1122 Type 2 diabetes mellitus with diabetic chronic kidney disease: Secondary | ICD-10-CM | POA: Diagnosis not present

## 2019-09-16 DIAGNOSIS — Z87891 Personal history of nicotine dependence: Secondary | ICD-10-CM | POA: Diagnosis not present

## 2019-09-16 DIAGNOSIS — I129 Hypertensive chronic kidney disease with stage 1 through stage 4 chronic kidney disease, or unspecified chronic kidney disease: Secondary | ICD-10-CM | POA: Diagnosis not present

## 2019-09-16 DIAGNOSIS — E669 Obesity, unspecified: Secondary | ICD-10-CM | POA: Diagnosis not present

## 2019-09-16 DIAGNOSIS — M159 Polyosteoarthritis, unspecified: Secondary | ICD-10-CM | POA: Diagnosis not present

## 2019-09-16 DIAGNOSIS — D631 Anemia in chronic kidney disease: Secondary | ICD-10-CM | POA: Diagnosis not present

## 2019-09-16 DIAGNOSIS — Z96653 Presence of artificial knee joint, bilateral: Secondary | ICD-10-CM | POA: Diagnosis not present

## 2019-09-16 DIAGNOSIS — M80061D Age-related osteoporosis with current pathological fracture, right lower leg, subsequent encounter for fracture with routine healing: Secondary | ICD-10-CM | POA: Diagnosis not present

## 2019-09-16 DIAGNOSIS — I350 Nonrheumatic aortic (valve) stenosis: Secondary | ICD-10-CM | POA: Diagnosis not present

## 2019-09-16 DIAGNOSIS — Z6835 Body mass index (BMI) 35.0-35.9, adult: Secondary | ICD-10-CM | POA: Diagnosis not present

## 2019-09-16 DIAGNOSIS — M109 Gout, unspecified: Secondary | ICD-10-CM | POA: Diagnosis not present

## 2019-09-16 DIAGNOSIS — N189 Chronic kidney disease, unspecified: Secondary | ICD-10-CM | POA: Diagnosis not present

## 2019-09-18 DIAGNOSIS — N189 Chronic kidney disease, unspecified: Secondary | ICD-10-CM | POA: Diagnosis not present

## 2019-09-18 DIAGNOSIS — I350 Nonrheumatic aortic (valve) stenosis: Secondary | ICD-10-CM | POA: Diagnosis not present

## 2019-09-18 DIAGNOSIS — E1122 Type 2 diabetes mellitus with diabetic chronic kidney disease: Secondary | ICD-10-CM | POA: Diagnosis not present

## 2019-09-18 DIAGNOSIS — M9711XD Periprosthetic fracture around internal prosthetic right knee joint, subsequent encounter: Secondary | ICD-10-CM | POA: Diagnosis not present

## 2019-09-18 DIAGNOSIS — M80061D Age-related osteoporosis with current pathological fracture, right lower leg, subsequent encounter for fracture with routine healing: Secondary | ICD-10-CM | POA: Diagnosis not present

## 2019-09-18 DIAGNOSIS — I129 Hypertensive chronic kidney disease with stage 1 through stage 4 chronic kidney disease, or unspecified chronic kidney disease: Secondary | ICD-10-CM | POA: Diagnosis not present

## 2019-09-19 DIAGNOSIS — G4733 Obstructive sleep apnea (adult) (pediatric): Secondary | ICD-10-CM | POA: Diagnosis not present

## 2019-09-25 DIAGNOSIS — M9711XD Periprosthetic fracture around internal prosthetic right knee joint, subsequent encounter: Secondary | ICD-10-CM | POA: Diagnosis not present

## 2019-09-25 DIAGNOSIS — E1122 Type 2 diabetes mellitus with diabetic chronic kidney disease: Secondary | ICD-10-CM | POA: Diagnosis not present

## 2019-09-25 DIAGNOSIS — I129 Hypertensive chronic kidney disease with stage 1 through stage 4 chronic kidney disease, or unspecified chronic kidney disease: Secondary | ICD-10-CM | POA: Diagnosis not present

## 2019-09-25 DIAGNOSIS — I350 Nonrheumatic aortic (valve) stenosis: Secondary | ICD-10-CM | POA: Diagnosis not present

## 2019-09-25 DIAGNOSIS — N189 Chronic kidney disease, unspecified: Secondary | ICD-10-CM | POA: Diagnosis not present

## 2019-09-25 DIAGNOSIS — M80061D Age-related osteoporosis with current pathological fracture, right lower leg, subsequent encounter for fracture with routine healing: Secondary | ICD-10-CM | POA: Diagnosis not present

## 2019-09-30 DIAGNOSIS — M1000 Idiopathic gout, unspecified site: Secondary | ICD-10-CM | POA: Diagnosis not present

## 2019-09-30 DIAGNOSIS — I10 Essential (primary) hypertension: Secondary | ICD-10-CM | POA: Diagnosis not present

## 2019-09-30 DIAGNOSIS — N183 Chronic kidney disease, stage 3 unspecified: Secondary | ICD-10-CM | POA: Diagnosis not present

## 2019-09-30 DIAGNOSIS — E1122 Type 2 diabetes mellitus with diabetic chronic kidney disease: Secondary | ICD-10-CM | POA: Diagnosis not present

## 2019-09-30 DIAGNOSIS — M810 Age-related osteoporosis without current pathological fracture: Secondary | ICD-10-CM | POA: Diagnosis not present

## 2019-09-30 DIAGNOSIS — E039 Hypothyroidism, unspecified: Secondary | ICD-10-CM | POA: Diagnosis not present

## 2019-09-30 DIAGNOSIS — I129 Hypertensive chronic kidney disease with stage 1 through stage 4 chronic kidney disease, or unspecified chronic kidney disease: Secondary | ICD-10-CM | POA: Diagnosis not present

## 2019-09-30 DIAGNOSIS — Z0000 Encounter for general adult medical examination without abnormal findings: Secondary | ICD-10-CM | POA: Diagnosis not present

## 2019-09-30 DIAGNOSIS — G4733 Obstructive sleep apnea (adult) (pediatric): Secondary | ICD-10-CM | POA: Diagnosis not present

## 2019-10-04 DIAGNOSIS — M9711XD Periprosthetic fracture around internal prosthetic right knee joint, subsequent encounter: Secondary | ICD-10-CM | POA: Diagnosis not present

## 2019-10-04 DIAGNOSIS — I129 Hypertensive chronic kidney disease with stage 1 through stage 4 chronic kidney disease, or unspecified chronic kidney disease: Secondary | ICD-10-CM | POA: Diagnosis not present

## 2019-10-04 DIAGNOSIS — M80061D Age-related osteoporosis with current pathological fracture, right lower leg, subsequent encounter for fracture with routine healing: Secondary | ICD-10-CM | POA: Diagnosis not present

## 2019-10-04 DIAGNOSIS — I350 Nonrheumatic aortic (valve) stenosis: Secondary | ICD-10-CM | POA: Diagnosis not present

## 2019-10-04 DIAGNOSIS — E1122 Type 2 diabetes mellitus with diabetic chronic kidney disease: Secondary | ICD-10-CM | POA: Diagnosis not present

## 2019-10-04 DIAGNOSIS — N189 Chronic kidney disease, unspecified: Secondary | ICD-10-CM | POA: Diagnosis not present

## 2019-10-08 ENCOUNTER — Telehealth: Payer: Self-pay

## 2019-10-08 NOTE — Telephone Encounter (Signed)
Please advise 

## 2019-10-08 NOTE — Telephone Encounter (Signed)
Shanon Brow, PT with [facility] would like to know if patient's appointment with Benjiman Core on Thursday, 10/10/2019 could be canceled until the end of her plan of treatment.  Stated that he thought that patient would be ready to increase WB status and frequency this week to 2 x a week, but WB status will not be changing.  Cb# is [phone].  Please advise.  Thank you.

## 2019-10-09 NOTE — Telephone Encounter (Signed)
Per my last note patient need rov with Dr Louanne Skye.  Needs to be changed so he can make changes if indicated to WB and ROM status. Does not need to see me Thursday.

## 2019-10-09 NOTE — Telephone Encounter (Signed)
appt moved to 10/28/2019 @ 245pm

## 2019-10-10 ENCOUNTER — Ambulatory Visit: Payer: Medicare Other | Admitting: Surgery

## 2019-10-11 ENCOUNTER — Other Ambulatory Visit: Payer: Self-pay

## 2019-10-11 ENCOUNTER — Ambulatory Visit (INDEPENDENT_AMBULATORY_CARE_PROVIDER_SITE_OTHER): Payer: Medicare Other | Admitting: Podiatry

## 2019-10-11 ENCOUNTER — Encounter

## 2019-10-11 ENCOUNTER — Encounter: Payer: Self-pay | Admitting: Podiatry

## 2019-10-11 DIAGNOSIS — E119 Type 2 diabetes mellitus without complications: Secondary | ICD-10-CM

## 2019-10-11 DIAGNOSIS — N189 Chronic kidney disease, unspecified: Secondary | ICD-10-CM | POA: Diagnosis not present

## 2019-10-11 DIAGNOSIS — I350 Nonrheumatic aortic (valve) stenosis: Secondary | ICD-10-CM | POA: Diagnosis not present

## 2019-10-11 DIAGNOSIS — M79675 Pain in left toe(s): Secondary | ICD-10-CM | POA: Diagnosis not present

## 2019-10-11 DIAGNOSIS — E1122 Type 2 diabetes mellitus with diabetic chronic kidney disease: Secondary | ICD-10-CM | POA: Diagnosis not present

## 2019-10-11 DIAGNOSIS — M79674 Pain in right toe(s): Secondary | ICD-10-CM

## 2019-10-11 DIAGNOSIS — B351 Tinea unguium: Secondary | ICD-10-CM | POA: Diagnosis not present

## 2019-10-11 DIAGNOSIS — M9711XD Periprosthetic fracture around internal prosthetic right knee joint, subsequent encounter: Secondary | ICD-10-CM | POA: Diagnosis not present

## 2019-10-11 DIAGNOSIS — M80061D Age-related osteoporosis with current pathological fracture, right lower leg, subsequent encounter for fracture with routine healing: Secondary | ICD-10-CM | POA: Diagnosis not present

## 2019-10-11 DIAGNOSIS — I129 Hypertensive chronic kidney disease with stage 1 through stage 4 chronic kidney disease, or unspecified chronic kidney disease: Secondary | ICD-10-CM | POA: Diagnosis not present

## 2019-10-11 NOTE — Progress Notes (Signed)
Complaint:  Visit Type: Patient returns to my office for continued preventative foot care services. Complaint: Patient states" my nails have grown long and thick and become painful to walk and wear shoes" . The patient presents for preventative foot care services. No changes to ROS.  Patient is diagnosed with diabetes.  Podiatric Exam: Vascular: dorsalis pedis and posterior tibial pulses are palpable bilateral. Capillary return is immediate. Temperature gradient is WNL. Skin turgor WNL  Sensorium: Normal Semmes Weinstein monofilament test. Normal tactile sensation bilaterally. Nail Exam: Pt has thick disfigured discolored nails with subungual debris noted bilateral entire nail hallux through fifth toenails.  Red inflamed medial border second toe left.   Ulcer Exam: There is no evidence of ulcer or pre-ulcerative changes or infection. Orthopedic Exam: Muscle tone and strength are WNL. No limitations in general ROM. No crepitus or effusions noted. Foot type and digits show no abnormalities. Bony prominences are unremarkable.Swelling  B/L. Skin: No Porokeratosis. No infection or ulcers  Diagnosis:  Onychomycosis, , Pain in right toe, pain in left toes  Treatment & Plan Procedures and Treatment: Consent by patient was obtained for treatment procedures.   Debridement of mycotic and hypertrophic toenails, 1 through 5 bilateral and clearing of subungual debris. No ulceration, no infection noted.  Return Visit-Office Procedure: Patient instructed to return to the office for a follow up visit 3 months for continued evaluation and treatment.    Gardiner Barefoot DPM

## 2019-10-15 ENCOUNTER — Ambulatory Visit: Payer: BLUE CROSS/BLUE SHIELD | Admitting: Podiatry

## 2019-10-16 ENCOUNTER — Telehealth: Payer: Self-pay

## 2019-10-16 DIAGNOSIS — I129 Hypertensive chronic kidney disease with stage 1 through stage 4 chronic kidney disease, or unspecified chronic kidney disease: Secondary | ICD-10-CM | POA: Diagnosis not present

## 2019-10-16 DIAGNOSIS — N189 Chronic kidney disease, unspecified: Secondary | ICD-10-CM | POA: Diagnosis not present

## 2019-10-16 DIAGNOSIS — E039 Hypothyroidism, unspecified: Secondary | ICD-10-CM | POA: Diagnosis not present

## 2019-10-16 DIAGNOSIS — M9711XD Periprosthetic fracture around internal prosthetic right knee joint, subsequent encounter: Secondary | ICD-10-CM | POA: Diagnosis not present

## 2019-10-16 DIAGNOSIS — D631 Anemia in chronic kidney disease: Secondary | ICD-10-CM | POA: Diagnosis not present

## 2019-10-16 DIAGNOSIS — Z6835 Body mass index (BMI) 35.0-35.9, adult: Secondary | ICD-10-CM | POA: Diagnosis not present

## 2019-10-16 DIAGNOSIS — Z9181 History of falling: Secondary | ICD-10-CM | POA: Diagnosis not present

## 2019-10-16 DIAGNOSIS — Z7984 Long term (current) use of oral hypoglycemic drugs: Secondary | ICD-10-CM | POA: Diagnosis not present

## 2019-10-16 DIAGNOSIS — M109 Gout, unspecified: Secondary | ICD-10-CM | POA: Diagnosis not present

## 2019-10-16 DIAGNOSIS — H409 Unspecified glaucoma: Secondary | ICD-10-CM | POA: Diagnosis not present

## 2019-10-16 DIAGNOSIS — I350 Nonrheumatic aortic (valve) stenosis: Secondary | ICD-10-CM | POA: Diagnosis not present

## 2019-10-16 DIAGNOSIS — E669 Obesity, unspecified: Secondary | ICD-10-CM | POA: Diagnosis not present

## 2019-10-16 DIAGNOSIS — M80061D Age-related osteoporosis with current pathological fracture, right lower leg, subsequent encounter for fracture with routine healing: Secondary | ICD-10-CM | POA: Diagnosis not present

## 2019-10-16 DIAGNOSIS — Z96653 Presence of artificial knee joint, bilateral: Secondary | ICD-10-CM | POA: Diagnosis not present

## 2019-10-16 DIAGNOSIS — Z87891 Personal history of nicotine dependence: Secondary | ICD-10-CM | POA: Diagnosis not present

## 2019-10-16 DIAGNOSIS — E1122 Type 2 diabetes mellitus with diabetic chronic kidney disease: Secondary | ICD-10-CM | POA: Diagnosis not present

## 2019-10-16 DIAGNOSIS — M159 Polyosteoarthritis, unspecified: Secondary | ICD-10-CM | POA: Diagnosis not present

## 2019-10-16 NOTE — Telephone Encounter (Signed)
Autumn Chambers, PT with [facility] would like an order to move patients visits to the end of her plan of treatment due to patient's appointment being moved to 10/28/2019 with Dr. Louanne Skye.  Would like verbal order for 1 x week for 1 week for ROM status for right knee.  Cb# is [phone].  Please advise.  Thank you.

## 2019-10-17 NOTE — Telephone Encounter (Signed)
I called and gave verbal auth to Shanon Brow

## 2019-10-23 DIAGNOSIS — I350 Nonrheumatic aortic (valve) stenosis: Secondary | ICD-10-CM | POA: Diagnosis not present

## 2019-10-23 DIAGNOSIS — I129 Hypertensive chronic kidney disease with stage 1 through stage 4 chronic kidney disease, or unspecified chronic kidney disease: Secondary | ICD-10-CM | POA: Diagnosis not present

## 2019-10-23 DIAGNOSIS — N189 Chronic kidney disease, unspecified: Secondary | ICD-10-CM | POA: Diagnosis not present

## 2019-10-23 DIAGNOSIS — M9711XD Periprosthetic fracture around internal prosthetic right knee joint, subsequent encounter: Secondary | ICD-10-CM | POA: Diagnosis not present

## 2019-10-23 DIAGNOSIS — M80061D Age-related osteoporosis with current pathological fracture, right lower leg, subsequent encounter for fracture with routine healing: Secondary | ICD-10-CM | POA: Diagnosis not present

## 2019-10-23 DIAGNOSIS — E1122 Type 2 diabetes mellitus with diabetic chronic kidney disease: Secondary | ICD-10-CM | POA: Diagnosis not present

## 2019-10-28 ENCOUNTER — Ambulatory Visit (INDEPENDENT_AMBULATORY_CARE_PROVIDER_SITE_OTHER): Payer: Medicare Other

## 2019-10-28 ENCOUNTER — Other Ambulatory Visit: Payer: Self-pay

## 2019-10-28 ENCOUNTER — Ambulatory Visit (INDEPENDENT_AMBULATORY_CARE_PROVIDER_SITE_OTHER): Payer: Medicare Other | Admitting: Specialist

## 2019-10-28 ENCOUNTER — Encounter: Payer: Self-pay | Admitting: Specialist

## 2019-10-28 VITALS — BP 167/78 | HR 87 | Ht 66.0 in | Wt 264.0 lb

## 2019-10-28 DIAGNOSIS — S82034A Nondisplaced transverse fracture of right patella, initial encounter for closed fracture: Secondary | ICD-10-CM

## 2019-10-28 DIAGNOSIS — S82031K Displaced transverse fracture of right patella, subsequent encounter for closed fracture with nonunion: Secondary | ICD-10-CM

## 2019-10-28 NOTE — Patient Instructions (Signed)
Discontinue the use of the right knee brace. Continue with the right knee cap bone stimulator for helping with healing of the patella Fracture. Home Health for strengthening exercises and work on independent walking and the use of a car

## 2019-10-28 NOTE — Progress Notes (Signed)
Office Visit Note   Patient: Autumn Chambers           Date of Birth: 01-May-1941           MRN: [id number] Visit Date: 10/28/2019              Requested by: Kathyrn Lass, [place],  [place] [address] PCP: Kathyrn Lass, MD   Assessment & Plan: Visit Diagnoses:  1. Closed nondisplaced transverse fracture of right patella, initial encounter   2. Closed displaced transverse fracture of right patella with nonunion, subsequent encounter     Plan: Discontinue the use of the right knee brace. Continue with the right knee cap bone stimulator for helping with healing of the patella Fracture. Home Health for strengthening exercises and work on independent walking and the use of a car  Follow-Up Instructions: Return in about 4 weeks (around 11/25/2019).   Orders:  Orders Placed This Encounter  Procedures  . XR Knee 1-2 Views Right  . Ambulatory referral to Home Health   No orders of the defined types were placed in this encounter.     Procedures: No procedures performed   Clinical Data: No additional findings.   Subjective: Chief Complaint  Patient presents with  . Right Knee - Pain    78 year old female with history of right TKR, now 5 months post right patella fracture with surgical treatment with repair of the distal fragment to the proximal fragment throught drill Holes with suture and cerclage. She is having bone stimulation to improve the healing and is having home health PT for right knee SLR exercises, quad strengthening and  Over all she is doing well. She has been using a bledsoe brace for nearly 5 months and is ready to get rid of it. Her pain level is 1-2 on a scale of 1-10. She is able to stand and walk With a walker. She was using a walker but was able to walk without the walker. She had the knee replacement with Dr. Sharol Given and right knee replacement a long time ago 8-10 years ago.    Review of Systems  Constitutional: Negative.   HENT:  Negative.   Eyes: Negative.   Respiratory: Negative.   Cardiovascular: Negative.   Gastrointestinal: Negative.   Endocrine: Negative.   Genitourinary: Negative.   Musculoskeletal: Negative.   Skin: Negative.   Allergic/Immunologic: Negative.   Neurological: Negative.   Hematological: Negative.   Psychiatric/Behavioral: Negative.      Objective: Vital Signs: BP (!) 167/78 (BP Location: Left Arm, Patient Position: Sitting)   Pulse 87   Ht 5\' 6"  (1.676 m)   Wt 264 lb (119.7 kg)   BMI 42.61 kg/m   Physical Exam Constitutional:      Appearance: She is well-developed.  HENT:     Head: Normocephalic and atraumatic.  Eyes:     Pupils: Pupils are equal, round, and reactive to light.  Pulmonary:     Effort: Pulmonary effort is normal.     Breath sounds: Normal breath sounds.  Abdominal:     General: Bowel sounds are normal.     Palpations: Abdomen is soft.  Musculoskeletal:        General: Normal range of motion.     Cervical back: Normal range of motion and neck supple.     Right knee: No effusion.     Instability Tests: Negative medial McMurray test and negative lateral McMurray test.  Skin:    General:  Skin is warm and dry.  Neurological:     Mental Status: She is alert and oriented to person, place, and time.  Psychiatric:        Behavior: Behavior normal.        Thought Content: Thought content normal.        Judgment: Judgment normal.     Right Knee Exam  Right knee exam is normal.  Muscle Strength  The patient has normal right knee strength.  Range of Motion  Extension: normal  Flexion: 120   Tests  McMurray:  Medial - negative Lateral - negative Varus: negative Valgus: negative  Other  Erythema: absent Scars: present Effusion: no effusion present  Comments:  No swelling.        Specialty Comments:  No specialty comments available.  Imaging: XR Knee 1-2 Views Right  Result Date: 10/28/2019 AP and lateral right knee with the transverse  patella fracture in good position and alignment there is less than 1 cm of separation of the major fragment and no change in the degree of displacement over the last 2 months. This is likely a fibrosis healing of a patella fracture. The knee brace is not preventing or assisting in healing at this point and the bone stimulator is perhaps of some benefit so I would recommend its continued use for another one month.     PMFS History: Patient Active Problem List   Diagnosis Date Noted  . Closed displaced fracture of right patella 06/09/2019  . Idiopathic chronic gout, unspecified site, without tophus (tophi) 12/12/2016  . Dyspnea 06/26/2015  . Angina pectoris ([place]) 06/26/2015  . Former smoker 06/26/2015  . Obesity 06/26/2015  . Type 2 diabetes mellitus without complication ([place]) 123456  . OSA (obstructive sleep apnea) 06/26/2015  . Aortic stenosis 06/26/2015  . Breast cancer of upper-inner quadrant of left female breast ([place]) 09/05/2013  . Osteoporosis 08/22/2013  . GERD (gastroesophageal reflux disease) 08/22/2013  . Hypothyroidism 08/22/2013  . Anxiety 08/22/2013  . Hypertension 08/22/2013  . Diabetes due to undrl condition w oth diabetic kidney comp ([place]) 08/22/2013  . Osteoarthritis of left knee 08/02/2013    Class: Diagnosis of   Past Medical History:  Diagnosis Date  . Anemia   . Arthritis   . Cancer ([place])    hx of skin ca  . Chronic kidney disease    CRI- sees Dr Posey Pronto  . Diabetes mellitus    oral  . Gout   . H/O hiatal hernia   . Hearing loss   . Hyperlipidemia   . Hypertension   . Hypothyroidism   . Irritable bowel   . Osteoporosis 02/2013   T score -2.9  . PONV (postoperative nausea and vomiting)    yearsago  . Shortness of breath    "mild upon exertion"  . Urinary tract infection    hx of    Family History  Problem Relation Age of Onset  . Cancer Father        Prostate  . Cancer Sister        Pancreatic cancer  . Cancer Brother        Brain cancer   . Hypertension Brother   . Heart disease Mother     Past Surgical History:  Procedure Laterality Date  . ACHILLES TENDON SURGERY    . BREAST LUMPECTOMY     left, with 2 nodes removed  . DILATION AND CURETTAGE OF UTERUS    . EYE SURGERY     bilateral  cataract surgery  . ORIF PATELLA Right 06/11/2019   Procedure: OPEN REDUCTION INTERNAL (ORIF) FIXATION RIGHT PATELLA FRACTURE WITH PINS, WIRE, CANNULATED SCREWS, CERCLAGE WIRE PATELLA TO TIBIA;  Surgeon: Jessy Oto, MD;  [facility];  Service: Orthopedics;  Laterality: Right;  . SKIN CANCER EXCISION     approx 4 years ago.  Nose  . TONSILLECTOMY    . TOTAL KNEE ARTHROPLASTY  01/04/2012   Procedure: TOTAL KNEE ARTHROPLASTY;  Surgeon: Newt Minion, MD;  Location: [facility];  Service: Orthopedics;  Laterality: Right;  Right Total Knee Arthroplasty  . TOTAL KNEE ARTHROPLASTY Left 07/31/2013   Procedure: LEFT TOTAL KNEE ARTHROPLASTY;  Surgeon: Newt Minion, MD;  Location: [facility];  Service: Orthopedics;  Laterality: Left;  Left Total Knee Arthroplasty   Social History   Occupational History  . Not on file  Tobacco Use  . Smoking status: Former Smoker    Years: 40.00    Types: Cigarettes  . Smokeless tobacco: Never Used  . Tobacco comment: stopped smoking 2009  Substance and Sexual Activity  . Alcohol use: No  . Drug use: No  . Sexual activity: Not on file

## 2019-10-30 DIAGNOSIS — I350 Nonrheumatic aortic (valve) stenosis: Secondary | ICD-10-CM | POA: Diagnosis not present

## 2019-10-30 DIAGNOSIS — N189 Chronic kidney disease, unspecified: Secondary | ICD-10-CM | POA: Diagnosis not present

## 2019-10-30 DIAGNOSIS — M80061D Age-related osteoporosis with current pathological fracture, right lower leg, subsequent encounter for fracture with routine healing: Secondary | ICD-10-CM | POA: Diagnosis not present

## 2019-10-30 DIAGNOSIS — I129 Hypertensive chronic kidney disease with stage 1 through stage 4 chronic kidney disease, or unspecified chronic kidney disease: Secondary | ICD-10-CM | POA: Diagnosis not present

## 2019-10-30 DIAGNOSIS — E1122 Type 2 diabetes mellitus with diabetic chronic kidney disease: Secondary | ICD-10-CM | POA: Diagnosis not present

## 2019-10-30 DIAGNOSIS — M9711XD Periprosthetic fracture around internal prosthetic right knee joint, subsequent encounter: Secondary | ICD-10-CM | POA: Diagnosis not present

## 2019-11-05 DIAGNOSIS — E1122 Type 2 diabetes mellitus with diabetic chronic kidney disease: Secondary | ICD-10-CM | POA: Diagnosis not present

## 2019-11-05 DIAGNOSIS — M80061D Age-related osteoporosis with current pathological fracture, right lower leg, subsequent encounter for fracture with routine healing: Secondary | ICD-10-CM | POA: Diagnosis not present

## 2019-11-05 DIAGNOSIS — N189 Chronic kidney disease, unspecified: Secondary | ICD-10-CM | POA: Diagnosis not present

## 2019-11-05 DIAGNOSIS — M9711XD Periprosthetic fracture around internal prosthetic right knee joint, subsequent encounter: Secondary | ICD-10-CM | POA: Diagnosis not present

## 2019-11-05 DIAGNOSIS — I350 Nonrheumatic aortic (valve) stenosis: Secondary | ICD-10-CM | POA: Diagnosis not present

## 2019-11-05 DIAGNOSIS — I129 Hypertensive chronic kidney disease with stage 1 through stage 4 chronic kidney disease, or unspecified chronic kidney disease: Secondary | ICD-10-CM | POA: Diagnosis not present

## 2019-11-11 DIAGNOSIS — M80061D Age-related osteoporosis with current pathological fracture, right lower leg, subsequent encounter for fracture with routine healing: Secondary | ICD-10-CM | POA: Diagnosis not present

## 2019-11-11 DIAGNOSIS — I129 Hypertensive chronic kidney disease with stage 1 through stage 4 chronic kidney disease, or unspecified chronic kidney disease: Secondary | ICD-10-CM | POA: Diagnosis not present

## 2019-11-11 DIAGNOSIS — I350 Nonrheumatic aortic (valve) stenosis: Secondary | ICD-10-CM | POA: Diagnosis not present

## 2019-11-11 DIAGNOSIS — M9711XD Periprosthetic fracture around internal prosthetic right knee joint, subsequent encounter: Secondary | ICD-10-CM | POA: Diagnosis not present

## 2019-11-11 DIAGNOSIS — N189 Chronic kidney disease, unspecified: Secondary | ICD-10-CM | POA: Diagnosis not present

## 2019-11-11 DIAGNOSIS — E1122 Type 2 diabetes mellitus with diabetic chronic kidney disease: Secondary | ICD-10-CM | POA: Diagnosis not present

## 2019-11-25 ENCOUNTER — Other Ambulatory Visit: Payer: Self-pay

## 2019-11-25 ENCOUNTER — Ambulatory Visit (INDEPENDENT_AMBULATORY_CARE_PROVIDER_SITE_OTHER): Payer: Medicare Other | Admitting: Specialist

## 2019-11-25 ENCOUNTER — Ambulatory Visit (INDEPENDENT_AMBULATORY_CARE_PROVIDER_SITE_OTHER): Payer: Medicare Other

## 2019-11-25 ENCOUNTER — Encounter: Payer: Self-pay | Admitting: Specialist

## 2019-11-25 VITALS — BP 153/67 | HR 96 | Ht 66.0 in | Wt 264.0 lb

## 2019-11-25 DIAGNOSIS — S82034A Nondisplaced transverse fracture of right patella, initial encounter for closed fracture: Secondary | ICD-10-CM | POA: Diagnosis not present

## 2019-11-25 DIAGNOSIS — S82032G Displaced transverse fracture of left patella, subsequent encounter for closed fracture with delayed healing: Secondary | ICD-10-CM

## 2019-11-25 NOTE — Patient Instructions (Signed)
  Knee is suffering from osteoarthritis, only real proven treatments are Weight loss, tylenol and exercise. Well padded shoes help. Ice the knee 2-3 times a day 15-20 mins at a time. Use a cane or a walker for ambulation. I would allow you to drive short distances 1-3 miles but recommend you practice first with car in the Parking lot.

## 2019-11-25 NOTE — Progress Notes (Signed)
Office Visit Note   Patient: Autumn Chambers           Date of Birth: 25-Aug-1941           MRN: [id number] Visit Date: 11/25/2019              Requested by: Kathyrn Lass, [place],  [place] [address] PCP: Kathyrn Lass, MD   Assessment & Plan: Visit Diagnoses:  1. Closed nondisplaced transverse fracture of right patella, initial encounter     Plan:Knee is suffering from osteoarthritis, only real proven treatments are Weight loss, tylenol and exercise. Well padded shoes help. Ice the knee 2-3 times a day 15-20 mins at a time. Use a cane or a walker for ambulation. I would allow you to drive short distances 1-3 miles but recommend you practice first with car in the Parking lot.   Follow-Up Instructions: No follow-ups on file.   Orders:  Orders Placed This Encounter  Procedures  . XR Knee 1-2 Views Right   No orders of the defined types were placed in this encounter.     Procedures: No procedures performed   Clinical Data: No additional findings.   Subjective: Chief Complaint  Patient presents with  . Right Knee - Follow-up    79 year  Old female with history of right patella fracture due to fall with bilateral TKRs. She was treated with ORIF with cerclage and suture of distal patella to remaining superior patella. She is standing and walking and is not taking pain medications. She is using a bone stimulator to stimulate healing of the fracture site.    Review of Systems   Objective: Vital Signs: BP (!) 153/67 (BP Location: Left Arm, Patient Position: Sitting)   Pulse 96   Ht 5\' 6"  (1.676 m)   Wt 264 lb (119.7 kg)   BMI 42.61 kg/m   Physical Exam  Ortho Exam  Specialty Comments:  No specialty comments available.  Imaging: No results found.   PMFS History: Patient Active Problem List   Diagnosis Date Noted  . Closed displaced fracture of right patella 06/09/2019  . Idiopathic chronic gout, unspecified site, without tophus  (tophi) 12/12/2016  . Dyspnea 06/26/2015  . Angina pectoris ([place]) 06/26/2015  . Former smoker 06/26/2015  . Obesity 06/26/2015  . Type 2 diabetes mellitus without complication ([place]) 123456  . OSA (obstructive sleep apnea) 06/26/2015  . Aortic stenosis 06/26/2015  . Breast cancer of upper-inner quadrant of left female breast ([place]) 09/05/2013  . Osteoporosis 08/22/2013  . GERD (gastroesophageal reflux disease) 08/22/2013  . Hypothyroidism 08/22/2013  . Anxiety 08/22/2013  . Hypertension 08/22/2013  . Diabetes due to undrl condition w oth diabetic kidney comp ([place]) 08/22/2013  . Osteoarthritis of left knee 08/02/2013    Class: Diagnosis of   Past Medical History:  Diagnosis Date  . Anemia   . Arthritis   . Cancer ([place])    hx of skin ca  . Chronic kidney disease    CRI- sees Dr Posey Pronto  . Diabetes mellitus    oral  . Gout   . H/O hiatal hernia   . Hearing loss   . Hyperlipidemia   . Hypertension   . Hypothyroidism   . Irritable bowel   . Osteoporosis 02/2013   T score -2.9  . PONV (postoperative nausea and vomiting)    yearsago  . Shortness of breath    "mild upon exertion"  . Urinary tract infection    hx  of    Family History  Problem Relation Age of Onset  . Cancer Father        Prostate  . Cancer Sister        Pancreatic cancer  . Cancer Brother        Brain cancer  . Hypertension Brother   . Heart disease Mother     Past Surgical History:  Procedure Laterality Date  . ACHILLES TENDON SURGERY    . BREAST LUMPECTOMY     left, with 2 nodes removed  . DILATION AND CURETTAGE OF UTERUS    . EYE SURGERY     bilateral cataract surgery  . ORIF PATELLA Right 06/11/2019   Procedure: OPEN REDUCTION INTERNAL (ORIF) FIXATION RIGHT PATELLA FRACTURE WITH PINS, WIRE, CANNULATED SCREWS, CERCLAGE WIRE PATELLA TO TIBIA;  Surgeon: Jessy Oto, MD;  [facility];  Service: Orthopedics;  Laterality: Right;  . SKIN CANCER EXCISION     approx 4 years ago.  Nose  .  TONSILLECTOMY    . TOTAL KNEE ARTHROPLASTY  01/04/2012   Procedure: TOTAL KNEE ARTHROPLASTY;  Surgeon: Newt Minion, MD;  Location: [facility];  Service: Orthopedics;  Laterality: Right;  Right Total Knee Arthroplasty  . TOTAL KNEE ARTHROPLASTY Left 07/31/2013   Procedure: LEFT TOTAL KNEE ARTHROPLASTY;  Surgeon: Newt Minion, MD;  Location: [facility];  Service: Orthopedics;  Laterality: Left;  Left Total Knee Arthroplasty   Social History   Occupational History  . Not on file  Tobacco Use  . Smoking status: Former Smoker    Years: 40.00    Types: Cigarettes  . Smokeless tobacco: Never Used  . Tobacco comment: stopped smoking 2009  Substance and Sexual Activity  . Alcohol use: No  . Drug use: No  . Sexual activity: Not on file

## 2019-11-29 IMAGING — CR RIGHT KNEE - COMPLETE 4+ VIEW
4 series · 4 of 4 positions shown · non-contrast
Comparison: January 04, 2012

CLINICAL DATA: Pain.

EXAM:
RIGHT KNEE - COMPLETE 4+ VIEW

[x knee ap right (1 of 3)]
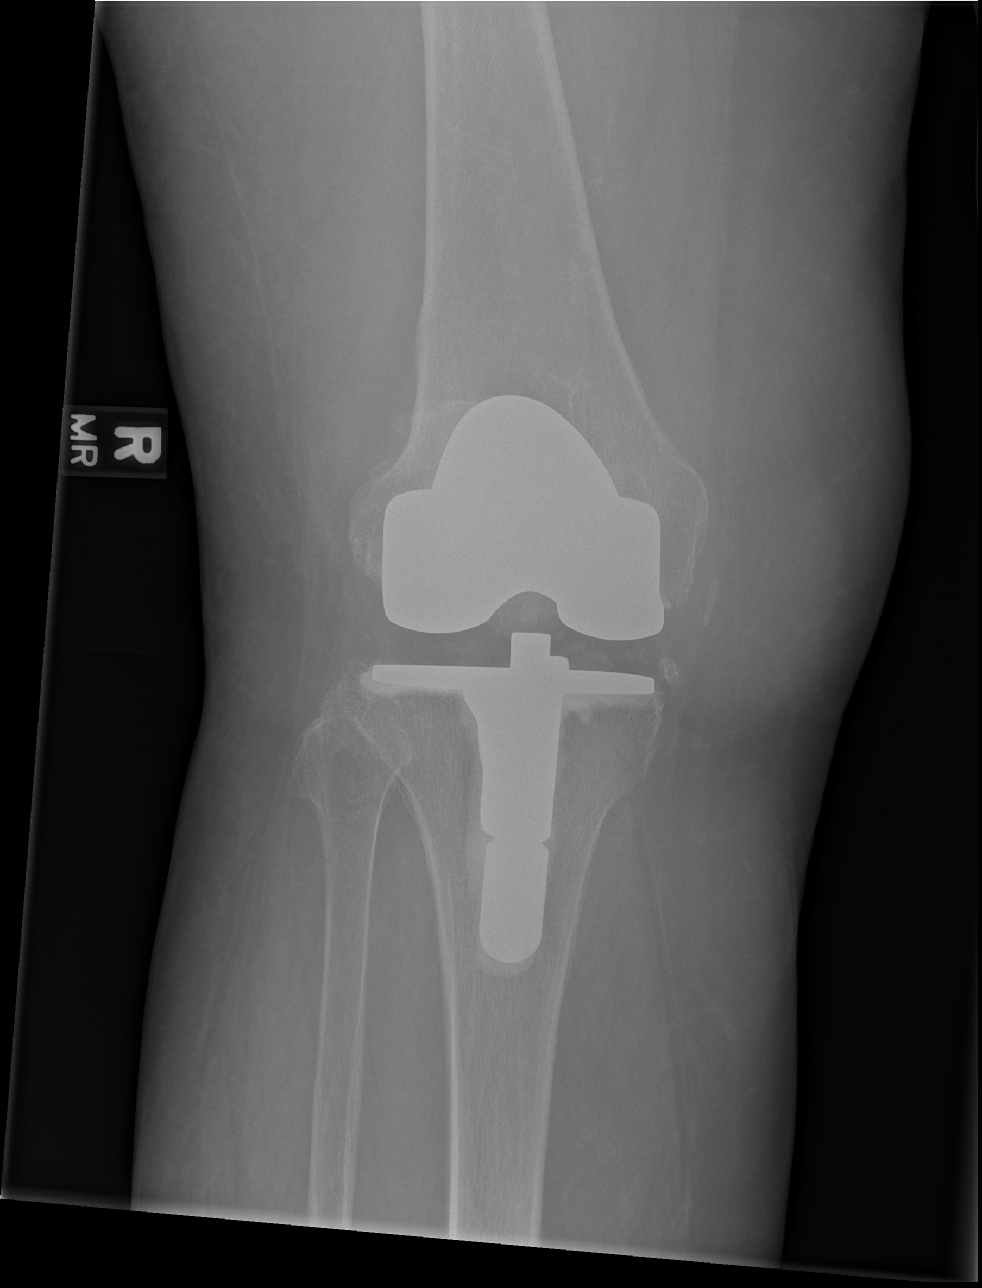

[x knee ap right (2 of 3)]
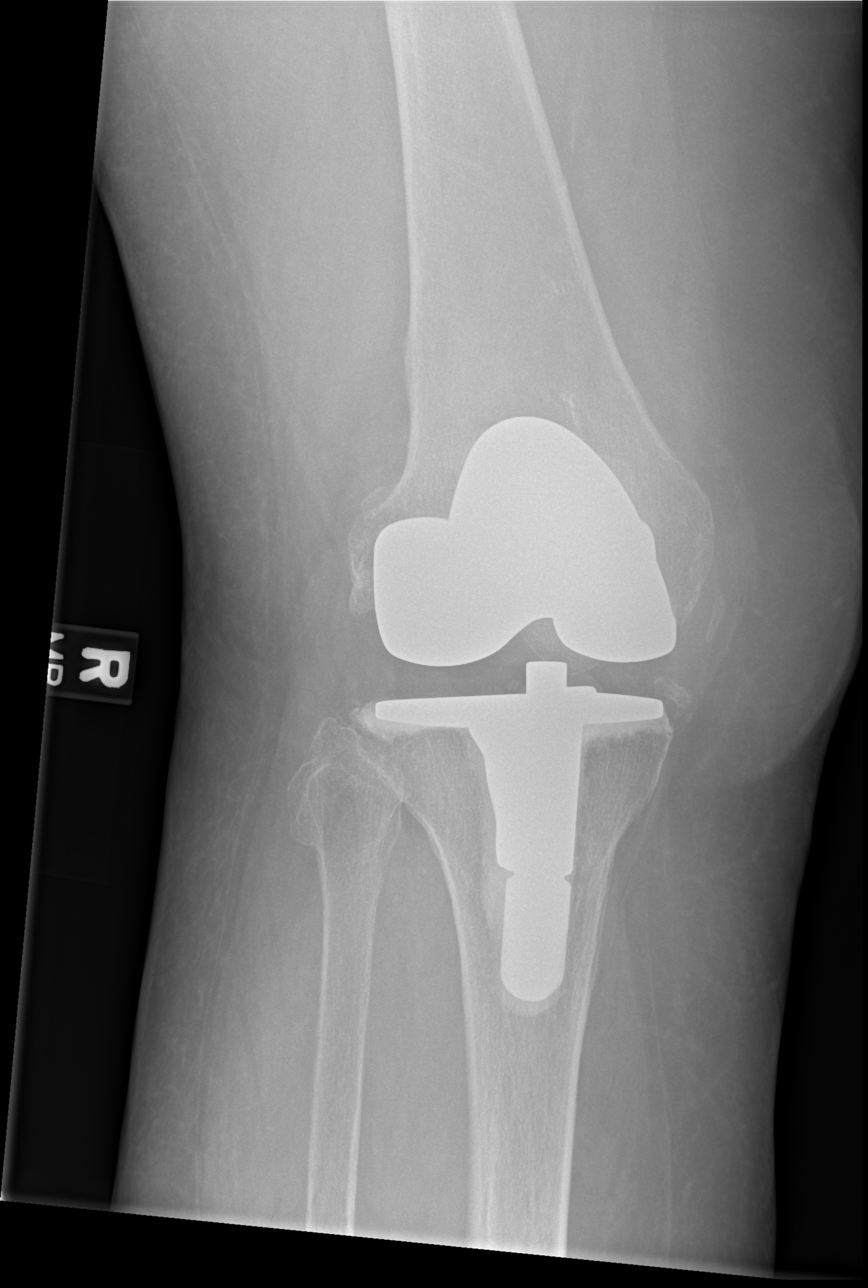

[x knee ap right (3 of 3)]
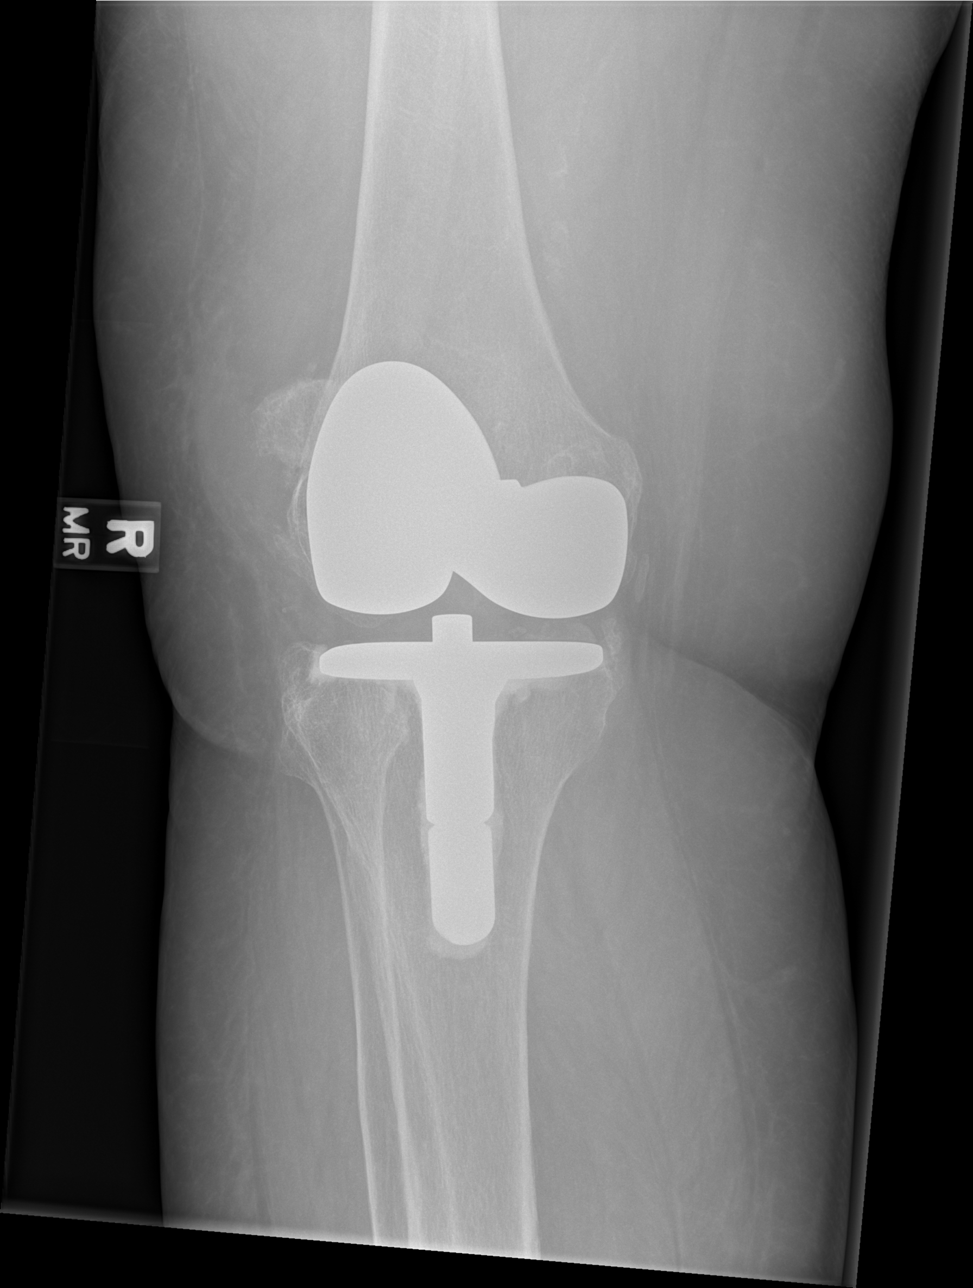

[x knee lat right]
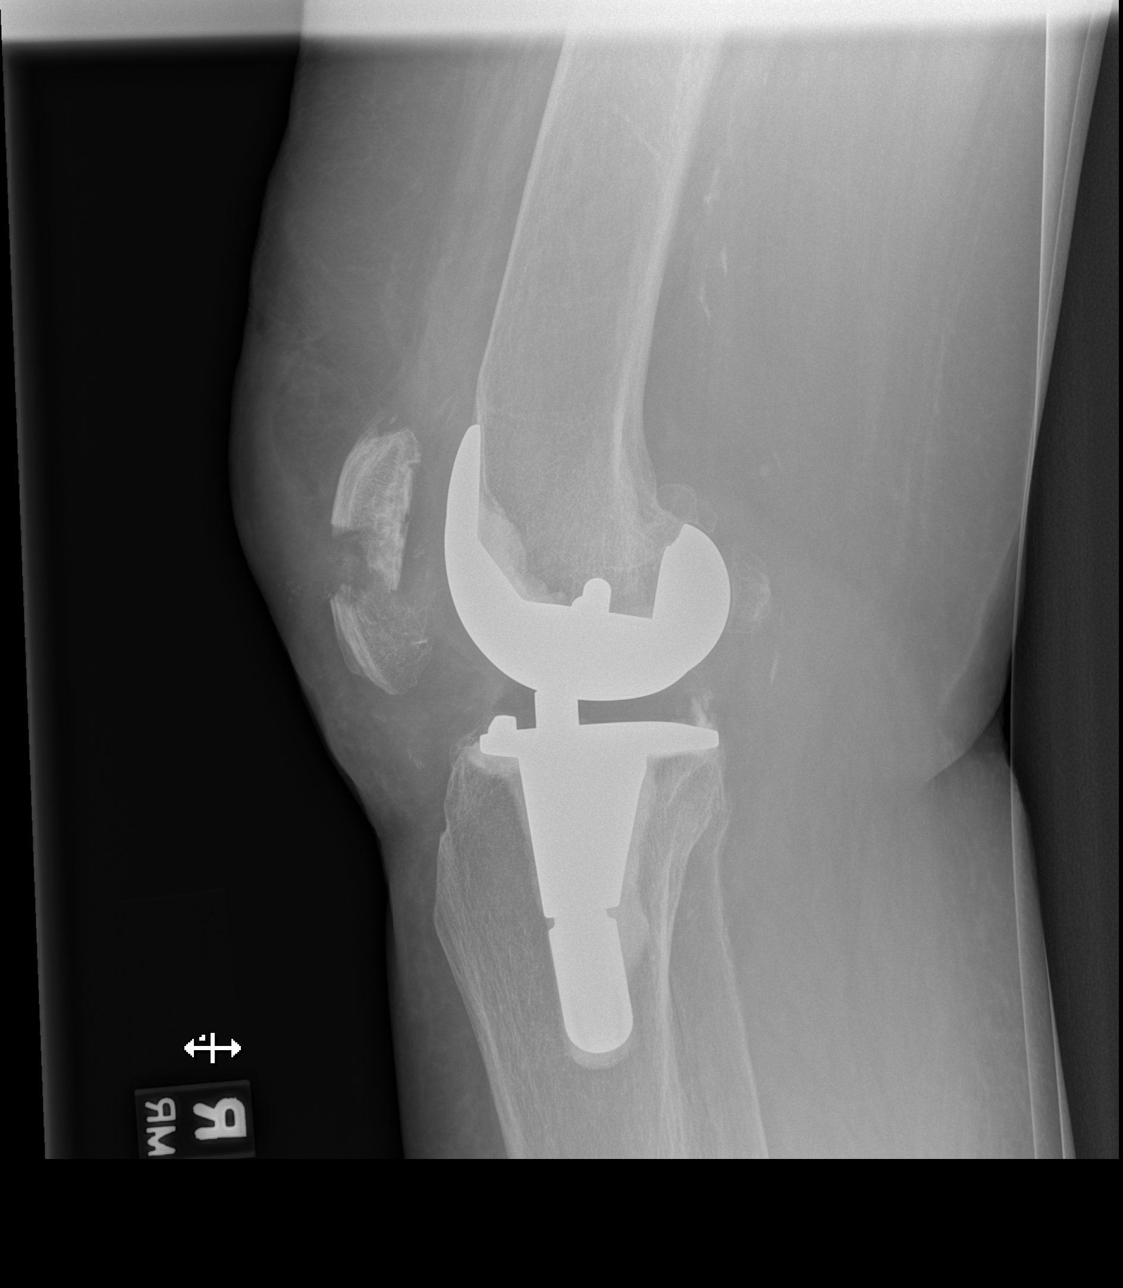

[4 of 4 positions shown; findings below may reference images not displayed]

FINDINGS: The patient is status post total knee arthroplasty on the right. The
hardware appears grossly intact. There is a displace/distracted
transverse fracture through the patella. There is extensive
surrounding soft tissue swelling. There is a small to moderate-sized
joint effusion.
IMPRESSION: 1. Acute displaced transverse fracture through the patella.
2. Status post total knee arthroplasty on the right without evidence
for a periprosthetic fracture.

## 2020-01-10 ENCOUNTER — Other Ambulatory Visit: Payer: Self-pay

## 2020-01-10 ENCOUNTER — Ambulatory Visit (INDEPENDENT_AMBULATORY_CARE_PROVIDER_SITE_OTHER): Payer: Medicare Other | Admitting: Podiatry

## 2020-01-10 ENCOUNTER — Encounter: Payer: Self-pay | Admitting: Podiatry

## 2020-01-10 DIAGNOSIS — B351 Tinea unguium: Secondary | ICD-10-CM

## 2020-01-10 DIAGNOSIS — M79675 Pain in left toe(s): Secondary | ICD-10-CM

## 2020-01-10 DIAGNOSIS — M79674 Pain in right toe(s): Secondary | ICD-10-CM

## 2020-01-10 DIAGNOSIS — N189 Chronic kidney disease, unspecified: Secondary | ICD-10-CM

## 2020-01-10 NOTE — Progress Notes (Signed)
This patient returns to my office for at risk foot care.  This patient requires this care by a professional since this patient will be at risk due to having  Diabetes with kidney disease.  This patient is unable to cut nails herself since the patient cannot reach there  nails.These nails are painful walking and wearing shoes.  This patient presents for at risk foot care today.  General Appearance  Alert, conversant and in no acute stress.  Vascular  Dorsalis pedis are not  palpable  bilaterally. Posterior tibial pulses are not palpable due to swelling.  Capillary return is within normal limits  bilaterally. Temperature is within normal limits  Bilaterally.  Swelling  B/L.  Neurologic  Senn-Weinstein monofilament wire test within normal limits  bilaterally. Muscle power within normal limits bilaterally.  Nails Thick disfigured discolored nails with subungual debris  from hallux to fifth toes bilaterally. No evidence of bacterial infection or drainage bilaterally.Fifth toenail unattached fifth toe left foot.  Orthopedic  No limitations of motion  feet .  No crepitus or effusions noted.  No bony pathology or digital deformities noted.  Skin  normotropic skin with no porokeratosis noted bilaterally.  No signs of infections or ulcers noted.     Onychomycosis  Pain in right toes  Pain in left toes  Consent was obtained for treatment procedures.   Mechanical debridement of nails 1-5  bilaterally performed with a nail nipper.  Filed with dremel without incident. No infection or ulcer.  Neosporin/DSD applied fifth toe left foot.   Return office visit   3 months        Told patient to return for periodic foot care and evaluation due to potential at risk complications.   Yussuf Sawyers DPM  

## 2020-01-29 DIAGNOSIS — Z961 Presence of intraocular lens: Secondary | ICD-10-CM | POA: Diagnosis not present

## 2020-01-29 DIAGNOSIS — H1789 Other corneal scars and opacities: Secondary | ICD-10-CM | POA: Diagnosis not present

## 2020-01-29 DIAGNOSIS — E119 Type 2 diabetes mellitus without complications: Secondary | ICD-10-CM | POA: Diagnosis not present

## 2020-01-29 DIAGNOSIS — H52203 Unspecified astigmatism, bilateral: Secondary | ICD-10-CM | POA: Diagnosis not present

## 2020-02-06 DIAGNOSIS — C44319 Basal cell carcinoma of skin of other parts of face: Secondary | ICD-10-CM | POA: Diagnosis not present

## 2020-02-20 DIAGNOSIS — D0462 Carcinoma in situ of skin of left upper limb, including shoulder: Secondary | ICD-10-CM | POA: Diagnosis not present

## 2020-02-20 DIAGNOSIS — C44319 Basal cell carcinoma of skin of other parts of face: Secondary | ICD-10-CM | POA: Diagnosis not present

## 2020-02-24 ENCOUNTER — Ambulatory Visit (INDEPENDENT_AMBULATORY_CARE_PROVIDER_SITE_OTHER): Payer: Medicare Other | Admitting: Specialist

## 2020-02-24 ENCOUNTER — Other Ambulatory Visit: Payer: Self-pay

## 2020-02-24 ENCOUNTER — Ambulatory Visit (INDEPENDENT_AMBULATORY_CARE_PROVIDER_SITE_OTHER): Payer: Medicare Other

## 2020-02-24 ENCOUNTER — Encounter: Payer: Self-pay | Admitting: Specialist

## 2020-02-24 VITALS — BP 180/88 | HR 89 | Ht 66.0 in | Wt 264.0 lb

## 2020-02-24 DIAGNOSIS — S82031K Displaced transverse fracture of right patella, subsequent encounter for closed fracture with nonunion: Secondary | ICD-10-CM | POA: Diagnosis not present

## 2020-02-24 DIAGNOSIS — S82034A Nondisplaced transverse fracture of right patella, initial encounter for closed fracture: Secondary | ICD-10-CM | POA: Diagnosis not present

## 2020-02-24 NOTE — Patient Instructions (Signed)
The main ways of treat osteoarthritis, that are found to be success. Weight loss helps to decrease pain. Exercise is important to maintaining cartilage and thickness and strengthening. Do not take NSAIDs like motrin, tylenol, alleve are meds due to risk of kidney sideeffects. Ice is okay  In afternoon and evening and hot shower in the am Careful with stairs, use a cane when not using a walker.

## 2020-02-24 NOTE — Progress Notes (Signed)
Office Visit Note   Patient: Autumn Chambers           Date of Birth: 11-06-1940           MRN: [id number] Visit Date: 02/24/2020              Requested by: Kathyrn Lass, [place],  [place] [address] PCP: Kathyrn Lass, MD   Assessment & Plan: Visit Diagnoses:  1. Closed nondisplaced transverse fracture of right patella, initial encounter   2. Closed displaced transverse fracture of right patella with nonunion, subsequent encounter     Plan: The main ways of treat osteoarthritis, that are found to be success. Weight loss helps to decrease pain. Exercise is important to maintaining cartilage and thickness and strengthening. Do not take NSAIDs like motrin, tylenol, alleve are meds due to risk of kidney sideeffects. Ice is okay  In afternoon and evening and hot shower in the am Careful with stairs, use a cane when not using a walker.  Follow-Up Instructions: Return in about 6 months (around 08/25/2020).   Orders:  Orders Placed This Encounter  Procedures  . XR Knee 1-2 Views Right   No orders of the defined types were placed in this encounter.     Procedures: No procedures performed   Clinical Data: No additional findings.   Subjective: Chief Complaint  Patient presents with  . Right Knee - Follow-up    79 year old female post right patella transverse fracture about right TKR. She had surgical ORIF with suture through drill holes and a clerclage fiberwire. She has a fibrous union and has used an external osteostimulator for the last 5 months. No pain. She is standing and walking with a walker.    Review of Systems  Constitutional: Negative.   HENT: Negative.   Eyes: Negative.   Respiratory: Negative.   Cardiovascular: Negative.   Gastrointestinal: Negative.   Endocrine: Negative.   Genitourinary: Negative.   Musculoskeletal: Negative.   Skin: Negative.   Allergic/Immunologic: Negative.   Neurological: Negative.   Hematological:  Negative.   Psychiatric/Behavioral: Negative.      Objective: Vital Signs: BP (!) 180/88 (BP Location: Right Arm, Patient Position: Sitting)   Pulse 89   Ht 5\' 6"  (1.676 m)   Wt 264 lb (119.7 kg)   BMI 42.61 kg/m   Physical Exam Constitutional:      Appearance: She is well-developed.  HENT:     Head: Normocephalic and atraumatic.  Eyes:     Pupils: Pupils are equal, round, and reactive to light.  Pulmonary:     Effort: Pulmonary effort is normal.     Breath sounds: Normal breath sounds.  Abdominal:     General: Bowel sounds are normal.     Palpations: Abdomen is soft.  Musculoskeletal:        General: Normal range of motion.     Cervical back: Normal range of motion and neck supple.     Right knee: No effusion.     Instability Tests: Negative anterior drawer test. Negative posterior drawer test. Negative medial McMurray test and negative lateral McMurray test.  Skin:    General: Skin is warm and dry.  Neurological:     Mental Status: She is alert and oriented to person, place, and time.  Psychiatric:        Behavior: Behavior normal.        Thought Content: Thought content normal.        Judgment: Judgment  normal.     Right Knee Exam  Right knee exam is normal.  Muscle Strength  The patient has normal right knee strength.  Range of Motion  Extension: 0  Flexion: 120   Tests  McMurray:  Medial - negative Lateral - negative Varus: negative Valgus: negative Lachman:  Anterior - negative    Posterior - negative Drawer:  Anterior - negative    Posterior - negative Pivot shift: negative  Other  Erythema: absent Scars: present Sensation: normal Swelling: mild Effusion: no effusion present      Specialty Comments:  No specialty comments available.  Imaging: XR Knee 1-2 Views Right  Result Date: 02/24/2020 AP standing and lateral right knee shows separation of the superior and inferior patella fragments of about 4-5 mm, this has not changed over the  last 4 months. No change in the appearance of an apparent fibrous union of a transverse patella fracture about the right total knee replacement.     PMFS History: Patient Active Problem List   Diagnosis Date Noted  . Closed displaced fracture of right patella 06/09/2019  . Idiopathic chronic gout, unspecified site, without tophus (tophi) 12/12/2016  . Dyspnea 06/26/2015  . Angina pectoris ([place]) 06/26/2015  . Former smoker 06/26/2015  . Obesity 06/26/2015  . Type 2 diabetes mellitus without complication ([place]) 123456  . OSA (obstructive sleep apnea) 06/26/2015  . Aortic stenosis 06/26/2015  . Breast cancer of upper-inner quadrant of left female breast ([place]) 09/05/2013  . Osteoporosis 08/22/2013  . GERD (gastroesophageal reflux disease) 08/22/2013  . Hypothyroidism 08/22/2013  . Anxiety 08/22/2013  . Hypertension 08/22/2013  . Diabetes due to undrl condition w oth diabetic kidney comp ([place]) 08/22/2013  . Osteoarthritis of left knee 08/02/2013    Class: Diagnosis of   Past Medical History:  Diagnosis Date  . Anemia   . Arthritis   . Cancer ([place])    hx of skin ca  . Chronic kidney disease    CRI- sees Dr Posey Pronto  . Diabetes mellitus    oral  . Gout   . H/O hiatal hernia   . Hearing loss   . Hyperlipidemia   . Hypertension   . Hypothyroidism   . Irritable bowel   . Osteoporosis 02/2013   T score -2.9  . PONV (postoperative nausea and vomiting)    yearsago  . Shortness of breath    "mild upon exertion"  . Urinary tract infection    hx of    Family History  Problem Relation Age of Onset  . Cancer Father        Prostate  . Cancer Sister        Pancreatic cancer  . Cancer Brother        Brain cancer  . Hypertension Brother   . Heart disease Mother     Past Surgical History:  Procedure Laterality Date  . ACHILLES TENDON SURGERY    . BREAST LUMPECTOMY     left, with 2 nodes removed  . DILATION AND CURETTAGE OF UTERUS    . EYE SURGERY     bilateral cataract  surgery  . ORIF PATELLA Right 06/11/2019   Procedure: OPEN REDUCTION INTERNAL (ORIF) FIXATION RIGHT PATELLA FRACTURE WITH PINS, WIRE, CANNULATED SCREWS, CERCLAGE WIRE PATELLA TO TIBIA;  Surgeon: Jessy Oto, MD;  Location: [facility];  Service: Orthopedics;  Laterality: Right;  . SKIN CANCER EXCISION     approx 4 years ago.  Nose  . TONSILLECTOMY    . TOTAL  KNEE ARTHROPLASTY  01/04/2012   Procedure: TOTAL KNEE ARTHROPLASTY;  Surgeon: Newt Minion, MD;  Location: [facility];  Service: Orthopedics;  Laterality: Right;  Right Total Knee Arthroplasty  . TOTAL KNEE ARTHROPLASTY Left 07/31/2013   Procedure: LEFT TOTAL KNEE ARTHROPLASTY;  Surgeon: Newt Minion, MD;  Location: [facility];  Service: Orthopedics;  Laterality: Left;  Left Total Knee Arthroplasty   Social History   Occupational History  . Not on file  Tobacco Use  . Smoking status: Former Smoker    Years: 40.00    Types: Cigarettes  . Smokeless tobacco: Never Used  . Tobacco comment: stopped smoking 2009  Substance and Sexual Activity  . Alcohol use: No  . Drug use: No  . Sexual activity: Not on file

## 2020-04-10 ENCOUNTER — Ambulatory Visit (INDEPENDENT_AMBULATORY_CARE_PROVIDER_SITE_OTHER): Payer: Medicare Other | Admitting: Podiatry

## 2020-04-10 ENCOUNTER — Other Ambulatory Visit: Payer: Self-pay

## 2020-04-10 ENCOUNTER — Encounter: Payer: Self-pay | Admitting: Podiatry

## 2020-04-10 DIAGNOSIS — M79674 Pain in right toe(s): Secondary | ICD-10-CM | POA: Diagnosis not present

## 2020-04-10 DIAGNOSIS — N189 Chronic kidney disease, unspecified: Secondary | ICD-10-CM

## 2020-04-10 DIAGNOSIS — E119 Type 2 diabetes mellitus without complications: Secondary | ICD-10-CM

## 2020-04-10 DIAGNOSIS — B351 Tinea unguium: Secondary | ICD-10-CM | POA: Diagnosis not present

## 2020-04-10 DIAGNOSIS — M79675 Pain in left toe(s): Secondary | ICD-10-CM | POA: Diagnosis not present

## 2020-04-10 NOTE — Progress Notes (Signed)
This patient returns to my office for at risk foot care.  This patient requires this care by a professional since this patient will be at risk due to having  Diabetes with kidney disease.  This patient is unable to cut nails herself since the patient cannot reach there  nails.These nails are painful walking and wearing shoes.  This patient presents for at risk foot care today.  General Appearance  Alert, conversant and in no acute stress.  Vascular  Dorsalis pedis are not  palpable  bilaterally. Posterior tibial pulses are not palpable due to swelling.  Capillary return is within normal limits  bilaterally. Temperature is within normal limits  Bilaterally.  Swelling  B/L.  Neurologic  Senn-Weinstein monofilament wire test within normal limits  bilaterally. Muscle power within normal limits bilaterally.  Nails Thick disfigured discolored nails with subungual debris  from hallux to fifth toes bilaterally. No evidence of bacterial infection or drainage bilaterally.Fifth toenail unattached fifth toe left foot.  Orthopedic  No limitations of motion  feet .  No crepitus or effusions noted.  No bony pathology or digital deformities noted.  Skin  normotropic skin with no porokeratosis noted bilaterally.  No signs of infections or ulcers noted.     Onychomycosis  Pain in right toes  Pain in left toes  Consent was obtained for treatment procedures.   Mechanical debridement of nails 1-5  bilaterally performed with a nail nipper.  Filed with dremel without incident. No infection or ulcer.  Neosporin/DSD applied fifth toe left foot.   Return office visit   3 months        Told patient to return for periodic foot care and evaluation due to potential at risk complications.   Gardiner Barefoot DPM

## 2020-06-18 DIAGNOSIS — R600 Localized edema: Secondary | ICD-10-CM | POA: Diagnosis not present

## 2020-07-03 DIAGNOSIS — Z6841 Body Mass Index (BMI) 40.0 and over, adult: Secondary | ICD-10-CM | POA: Diagnosis not present

## 2020-07-03 DIAGNOSIS — R600 Localized edema: Secondary | ICD-10-CM | POA: Diagnosis not present

## 2020-07-03 DIAGNOSIS — N183 Chronic kidney disease, stage 3 unspecified: Secondary | ICD-10-CM | POA: Diagnosis not present

## 2020-07-03 DIAGNOSIS — Z23 Encounter for immunization: Secondary | ICD-10-CM | POA: Diagnosis not present

## 2020-07-21 ENCOUNTER — Ambulatory Visit (INDEPENDENT_AMBULATORY_CARE_PROVIDER_SITE_OTHER): Payer: Medicare Other | Admitting: Podiatry

## 2020-07-21 ENCOUNTER — Other Ambulatory Visit: Payer: Self-pay

## 2020-07-21 ENCOUNTER — Encounter: Payer: Self-pay | Admitting: Podiatry

## 2020-07-21 DIAGNOSIS — B351 Tinea unguium: Secondary | ICD-10-CM | POA: Diagnosis not present

## 2020-07-21 DIAGNOSIS — M79675 Pain in left toe(s): Secondary | ICD-10-CM | POA: Diagnosis not present

## 2020-07-21 DIAGNOSIS — N189 Chronic kidney disease, unspecified: Secondary | ICD-10-CM

## 2020-07-21 DIAGNOSIS — M79674 Pain in right toe(s): Secondary | ICD-10-CM | POA: Diagnosis not present

## 2020-07-21 DIAGNOSIS — E119 Type 2 diabetes mellitus without complications: Secondary | ICD-10-CM | POA: Diagnosis not present

## 2020-07-21 NOTE — Progress Notes (Signed)
This patient returns to my office for at risk foot care.  This patient requires this care by a professional since this patient will be at risk due to having  Diabetes with kidney disease.  This patient is unable to cut nails herself since the patient cannot reach there  nails.These nails are painful walking and wearing shoes.  This patient presents for at risk foot care today.  General Appearance  Alert, conversant and in no acute stress.  Vascular  Dorsalis pedis are not  palpable  bilaterally. Posterior tibial pulses are not palpable due to swelling.  Capillary return is within normal limits  bilaterally. Temperature is within normal limits  Bilaterally.  Swelling  B/L.  Neurologic  Senn-Weinstein monofilament wire test within normal limits  bilaterally. Muscle power within normal limits bilaterally.  Nails Thick disfigured discolored nails with subungual debris  from hallux to fifth toes bilaterally. No evidence of bacterial infection or drainage bilaterally.Fifth toenail unattached fifth toe left foot.  Orthopedic  No limitations of motion  feet .  No crepitus or effusions noted.  No bony pathology or digital deformities noted.  Skin  normotropic skin with no porokeratosis noted bilaterally.  No signs of infections or ulcers noted.     Onychomycosis  Pain in right toes  Pain in left toes  Consent was obtained for treatment procedures.   Mechanical debridement of nails 1-5  bilaterally performed with a nail nipper.  Filed with dremel without incident. No infection or ulcer.    Return office visit   3 months        Told patient to return for periodic foot care and evaluation due to potential at risk complications.   Harshith Pursell DPM  

## 2020-08-12 DIAGNOSIS — Z23 Encounter for immunization: Secondary | ICD-10-CM | POA: Diagnosis not present

## 2020-08-19 DIAGNOSIS — I8310 Varicose veins of unspecified lower extremity with inflammation: Secondary | ICD-10-CM | POA: Diagnosis not present

## 2020-08-19 DIAGNOSIS — L905 Scar conditions and fibrosis of skin: Secondary | ICD-10-CM | POA: Diagnosis not present

## 2020-08-19 DIAGNOSIS — Z85828 Personal history of other malignant neoplasm of skin: Secondary | ICD-10-CM | POA: Diagnosis not present

## 2020-08-19 DIAGNOSIS — Z85820 Personal history of malignant melanoma of skin: Secondary | ICD-10-CM | POA: Diagnosis not present

## 2020-08-19 DIAGNOSIS — C44519 Basal cell carcinoma of skin of other part of trunk: Secondary | ICD-10-CM | POA: Diagnosis not present

## 2020-08-19 DIAGNOSIS — L989 Disorder of the skin and subcutaneous tissue, unspecified: Secondary | ICD-10-CM | POA: Diagnosis not present

## 2020-08-19 DIAGNOSIS — L570 Actinic keratosis: Secondary | ICD-10-CM | POA: Diagnosis not present

## 2020-08-19 DIAGNOSIS — D485 Neoplasm of uncertain behavior of skin: Secondary | ICD-10-CM | POA: Diagnosis not present

## 2020-08-27 ENCOUNTER — Ambulatory Visit (INDEPENDENT_AMBULATORY_CARE_PROVIDER_SITE_OTHER): Payer: Medicare Other

## 2020-08-27 ENCOUNTER — Encounter: Payer: Self-pay | Admitting: Specialist

## 2020-08-27 ENCOUNTER — Other Ambulatory Visit: Payer: Self-pay

## 2020-08-27 ENCOUNTER — Ambulatory Visit (INDEPENDENT_AMBULATORY_CARE_PROVIDER_SITE_OTHER): Payer: Medicare Other | Admitting: Specialist

## 2020-08-27 VITALS — BP 182/77 | HR 90 | Ht 66.0 in | Wt 264.0 lb

## 2020-08-27 DIAGNOSIS — S82034A Nondisplaced transverse fracture of right patella, initial encounter for closed fracture: Secondary | ICD-10-CM

## 2020-08-27 NOTE — Progress Notes (Signed)
Office Visit Note   Patient: Autumn Chambers           Date of Birth: 1941/08/05           MRN: [id number] Visit Date: 08/27/2020              Requested by: Kathyrn Lass, [place],   [id number] PCP: Kathyrn Lass, MD   Assessment & Plan: Visit Diagnoses:  1. Closed nondisplaced transverse fracture of right patella, initial encounter     Plan: May continue to ambulater full weight bearing on the right leg. Using a walker or a cane for ambulator.  May be discharged to be seen as necessary.   Follow-Up Instructions: No follow-ups on file.   Orders:  Orders Placed This Encounter  Procedures  . XR Knee 1-2 Views Right   No orders of the defined types were placed in this encounter.     Procedures: No procedures performed   Clinical Data: No additional findings.   Subjective: Chief Complaint  Patient presents with  . Right Knee - Follow-up    79 year old female 14 months following right patella fracture about a right total knee replacement treated withORIF with cerclage and    Review of Systems  Constitutional: Negative.   HENT: Negative.   Eyes: Negative.   Respiratory: Negative.   Cardiovascular: Negative.   Gastrointestinal: Negative.   Endocrine: Negative.   Genitourinary: Negative.   Musculoskeletal: Negative.   Skin: Negative.   Allergic/Immunologic: Negative.   Neurological: Negative.   Hematological: Negative.   Psychiatric/Behavioral: Negative.      Objective: Vital Signs: BP (!) 182/77 (BP Location: Right Arm, Patient Position: Sitting)   Pulse 90   Ht 5\' 6"  (1.676 m)   Wt 264 lb (119.7 kg)   BMI 42.61 kg/m   Physical Exam Constitutional:      Appearance: She is well-developed.  HENT:     Head: Normocephalic and atraumatic.  Eyes:     Pupils: Pupils are equal, round, and reactive to light.  Pulmonary:     Effort: Pulmonary effort is normal.     Breath sounds: Normal breath sounds.  Abdominal:      General: Bowel sounds are normal.     Palpations: Abdomen is soft.  Musculoskeletal:        General: Normal range of motion.     Cervical back: Normal range of motion and neck supple.  Skin:    General: Skin is warm and dry.  Neurological:     Mental Status: She is alert and oriented to person, place, and time.  Psychiatric:        Behavior: Behavior normal.        Thought Content: Thought content normal.        Judgment: Judgment normal.     Right Knee Exam   Range of Motion  Extension: normal  Flexion: 120   Other  Erythema: absent  Comments:  Motor and knee extension strength is normal 5/5      Specialty Comments:  No specialty comments available.  Imaging: No results found.   PMFS History: Patient Active Problem List   Diagnosis Date Noted  . Closed displaced fracture of right patella 06/09/2019  . Idiopathic chronic gout, unspecified site, without tophus (tophi) 12/12/2016  . Dyspnea 06/26/2015  . Angina pectoris ([place]) 06/26/2015  . Former smoker 06/26/2015  . Obesity 06/26/2015  . Type 2 diabetes mellitus without complication ([place]) 16/60/6301  . OSA (  obstructive sleep apnea) 06/26/2015  . Aortic stenosis 06/26/2015  . Breast cancer of upper-inner quadrant of left female breast ([place]) 09/05/2013  . Osteoporosis 08/22/2013  . GERD (gastroesophageal reflux disease) 08/22/2013  . Hypothyroidism 08/22/2013  . Anxiety 08/22/2013  . Hypertension 08/22/2013  . Diabetes due to undrl condition w oth diabetic kidney comp ([place]) 08/22/2013  . Osteoarthritis of left knee 08/02/2013    Class: Diagnosis of   Past Medical History:  Diagnosis Date  . Anemia   . Arthritis   . Cancer ([place])    hx of skin ca  . Chronic kidney disease    CRI- sees Dr Posey Pronto  . Diabetes mellitus    oral  . Gout   . H/O hiatal hernia   . Hearing loss   . Hyperlipidemia   . Hypertension   . Hypothyroidism   . Irritable bowel   . Osteoporosis 02/2013   T score -2.9  . PONV  (postoperative nausea and vomiting)    yearsago  . Shortness of breath    "mild upon exertion"  . Urinary tract infection    hx of    Family History  Problem Relation Age of Onset  . Cancer Father        Prostate  . Cancer Sister        Pancreatic cancer  . Cancer Brother        Brain cancer  . Hypertension Brother   . Heart disease Mother     Past Surgical History:  Procedure Laterality Date  . ACHILLES TENDON SURGERY    . BREAST LUMPECTOMY     left, with 2 nodes removed  . DILATION AND CURETTAGE OF UTERUS    . EYE SURGERY     bilateral cataract surgery  . ORIF PATELLA Right 06/11/2019   Procedure: OPEN REDUCTION INTERNAL (ORIF) FIXATION RIGHT PATELLA FRACTURE WITH PINS, WIRE, CANNULATED SCREWS, CERCLAGE WIRE PATELLA TO TIBIA;  Surgeon: Jessy Oto, MD;  [facility]: [facility];  Service: Orthopedics;  Laterality: Right;  . SKIN CANCER EXCISION     approx 4 years ago.  Nose  . TONSILLECTOMY    . TOTAL KNEE ARTHROPLASTY  01/04/2012   Procedure: TOTAL KNEE ARTHROPLASTY;  Surgeon: Newt Minion, MD;  Location: [facility];  Service: Orthopedics;  Laterality: Right;  Right Total Knee Arthroplasty  . TOTAL KNEE ARTHROPLASTY Left 07/31/2013   Procedure: LEFT TOTAL KNEE ARTHROPLASTY;  Surgeon: Newt Minion, MD;  Location: [facility];  Service: Orthopedics;  Laterality: Left;  Left Total Knee Arthroplasty   Social History   Occupational History  . Not on file  Tobacco Use  . Smoking status: Former Smoker    Years: 40.00    Types: Cigarettes  . Smokeless tobacco: Never Used  . Tobacco comment: stopped smoking 2009  Vaping Use  . Vaping Use: Never used  Substance and Sexual Activity  . Alcohol use: No  . Drug use: No  . Sexual activity: Not on file

## 2020-08-27 NOTE — Patient Instructions (Signed)
May continue to ambulater full weight bearing on the right leg. Using a walker or a cane for ambulator.  May be discharged to be seen as necessary.

## 2020-09-04 DIAGNOSIS — L309 Dermatitis, unspecified: Secondary | ICD-10-CM | POA: Diagnosis not present

## 2020-09-04 DIAGNOSIS — Z6839 Body mass index (BMI) 39.0-39.9, adult: Secondary | ICD-10-CM | POA: Diagnosis not present

## 2020-09-04 DIAGNOSIS — E1122 Type 2 diabetes mellitus with diabetic chronic kidney disease: Secondary | ICD-10-CM | POA: Diagnosis not present

## 2020-09-04 DIAGNOSIS — G629 Polyneuropathy, unspecified: Secondary | ICD-10-CM | POA: Diagnosis not present

## 2020-09-18 DIAGNOSIS — C44519 Basal cell carcinoma of skin of other part of trunk: Secondary | ICD-10-CM | POA: Diagnosis not present

## 2020-09-18 DIAGNOSIS — D0359 Melanoma in situ of other part of trunk: Secondary | ICD-10-CM | POA: Diagnosis not present

## 2020-09-18 DIAGNOSIS — L905 Scar conditions and fibrosis of skin: Secondary | ICD-10-CM | POA: Diagnosis not present

## 2020-09-23 DIAGNOSIS — E039 Hypothyroidism, unspecified: Secondary | ICD-10-CM | POA: Diagnosis not present

## 2020-09-23 DIAGNOSIS — E1122 Type 2 diabetes mellitus with diabetic chronic kidney disease: Secondary | ICD-10-CM | POA: Diagnosis not present

## 2020-09-23 DIAGNOSIS — E785 Hyperlipidemia, unspecified: Secondary | ICD-10-CM | POA: Diagnosis not present

## 2020-09-23 DIAGNOSIS — M810 Age-related osteoporosis without current pathological fracture: Secondary | ICD-10-CM | POA: Diagnosis not present

## 2020-09-23 DIAGNOSIS — N183 Chronic kidney disease, stage 3 unspecified: Secondary | ICD-10-CM | POA: Diagnosis not present

## 2020-09-23 DIAGNOSIS — D631 Anemia in chronic kidney disease: Secondary | ICD-10-CM | POA: Diagnosis not present

## 2020-09-23 DIAGNOSIS — I129 Hypertensive chronic kidney disease with stage 1 through stage 4 chronic kidney disease, or unspecified chronic kidney disease: Secondary | ICD-10-CM | POA: Diagnosis not present

## 2020-09-23 DIAGNOSIS — Z853 Personal history of malignant neoplasm of breast: Secondary | ICD-10-CM | POA: Diagnosis not present

## 2020-09-30 DIAGNOSIS — Z0000 Encounter for general adult medical examination without abnormal findings: Secondary | ICD-10-CM | POA: Diagnosis not present

## 2020-09-30 DIAGNOSIS — Z6839 Body mass index (BMI) 39.0-39.9, adult: Secondary | ICD-10-CM | POA: Diagnosis not present

## 2020-09-30 DIAGNOSIS — N183 Chronic kidney disease, stage 3 unspecified: Secondary | ICD-10-CM | POA: Diagnosis not present

## 2020-09-30 DIAGNOSIS — E1122 Type 2 diabetes mellitus with diabetic chronic kidney disease: Secondary | ICD-10-CM | POA: Diagnosis not present

## 2020-10-06 DIAGNOSIS — Z853 Personal history of malignant neoplasm of breast: Secondary | ICD-10-CM | POA: Diagnosis not present

## 2020-10-06 DIAGNOSIS — E785 Hyperlipidemia, unspecified: Secondary | ICD-10-CM | POA: Diagnosis not present

## 2020-10-06 DIAGNOSIS — M810 Age-related osteoporosis without current pathological fracture: Secondary | ICD-10-CM | POA: Diagnosis not present

## 2020-10-06 DIAGNOSIS — E039 Hypothyroidism, unspecified: Secondary | ICD-10-CM | POA: Diagnosis not present

## 2020-10-06 DIAGNOSIS — E1122 Type 2 diabetes mellitus with diabetic chronic kidney disease: Secondary | ICD-10-CM | POA: Diagnosis not present

## 2020-10-06 DIAGNOSIS — I129 Hypertensive chronic kidney disease with stage 1 through stage 4 chronic kidney disease, or unspecified chronic kidney disease: Secondary | ICD-10-CM | POA: Diagnosis not present

## 2020-10-06 DIAGNOSIS — N183 Chronic kidney disease, stage 3 unspecified: Secondary | ICD-10-CM | POA: Diagnosis not present

## 2020-10-16 DIAGNOSIS — L905 Scar conditions and fibrosis of skin: Secondary | ICD-10-CM | POA: Diagnosis not present

## 2020-10-16 DIAGNOSIS — C44519 Basal cell carcinoma of skin of other part of trunk: Secondary | ICD-10-CM | POA: Diagnosis not present

## 2020-10-21 ENCOUNTER — Ambulatory Visit (INDEPENDENT_AMBULATORY_CARE_PROVIDER_SITE_OTHER): Payer: Medicare Other | Admitting: Podiatry

## 2020-10-21 ENCOUNTER — Other Ambulatory Visit: Payer: Self-pay

## 2020-10-21 ENCOUNTER — Encounter: Payer: Self-pay | Admitting: Podiatry

## 2020-10-21 DIAGNOSIS — M79675 Pain in left toe(s): Secondary | ICD-10-CM

## 2020-10-21 DIAGNOSIS — M79674 Pain in right toe(s): Secondary | ICD-10-CM | POA: Diagnosis not present

## 2020-10-21 DIAGNOSIS — B351 Tinea unguium: Secondary | ICD-10-CM

## 2020-10-21 DIAGNOSIS — E119 Type 2 diabetes mellitus without complications: Secondary | ICD-10-CM | POA: Diagnosis not present

## 2020-10-21 DIAGNOSIS — N189 Chronic kidney disease, unspecified: Secondary | ICD-10-CM | POA: Diagnosis not present

## 2020-10-21 NOTE — Progress Notes (Signed)
This patient returns to my office for at risk foot care.  This patient requires this care by a professional since this patient will be at risk due to having  Diabetes with kidney disease.  This patient is unable to cut nails herself since the patient cannot reach there  nails.These nails are painful walking and wearing shoes.  This patient presents for at risk foot care today.  General Appearance  Alert, conversant and in no acute stress.  Vascular  Dorsalis pedis are not  palpable  bilaterally. Posterior tibial pulses are not palpable due to swelling.  Capillary return is within normal limits  bilaterally. Temperature is within normal limits  Bilaterally.  Swelling  B/L.  Neurologic  Senn-Weinstein monofilament wire test within normal limits  bilaterally. Muscle power within normal limits bilaterally.  Nails Thick disfigured discolored nails with subungual debris  from hallux to fifth toes bilaterally. No evidence of bacterial infection or drainage bilaterally.Fifth toenail unattached fifth toe left foot.  Orthopedic  No limitations of motion  feet .  No crepitus or effusions noted.  No bony pathology or digital deformities noted.  Skin  normotropic skin with no porokeratosis noted bilaterally.  No signs of infections or ulcers noted.     Onychomycosis  Pain in right toes  Pain in left toes  Consent was obtained for treatment procedures.   Mechanical debridement of nails 1-5  bilaterally performed with a nail nipper.  Filed with dremel without incident. No infection or ulcer.    Return office visit   3 months        Told patient to return for periodic foot care and evaluation due to potential at risk complications.   Kerline Trahan DPM  

## 2020-12-01 DIAGNOSIS — G4733 Obstructive sleep apnea (adult) (pediatric): Secondary | ICD-10-CM | POA: Diagnosis not present

## 2021-01-26 ENCOUNTER — Ambulatory Visit (INDEPENDENT_AMBULATORY_CARE_PROVIDER_SITE_OTHER): Payer: Medicare Other | Admitting: Podiatry

## 2021-01-26 ENCOUNTER — Encounter: Payer: Self-pay | Admitting: Podiatry

## 2021-01-26 ENCOUNTER — Other Ambulatory Visit: Payer: Self-pay

## 2021-01-26 DIAGNOSIS — M79675 Pain in left toe(s): Secondary | ICD-10-CM

## 2021-01-26 DIAGNOSIS — M79674 Pain in right toe(s): Secondary | ICD-10-CM | POA: Diagnosis not present

## 2021-01-26 DIAGNOSIS — N189 Chronic kidney disease, unspecified: Secondary | ICD-10-CM

## 2021-01-26 DIAGNOSIS — B351 Tinea unguium: Secondary | ICD-10-CM | POA: Diagnosis not present

## 2021-01-26 DIAGNOSIS — E119 Type 2 diabetes mellitus without complications: Secondary | ICD-10-CM | POA: Diagnosis not present

## 2021-01-26 NOTE — Progress Notes (Signed)
This patient returns to my office for at risk foot care.  This patient requires this care by a professional since this patient will be at risk due to having  Diabetes with kidney disease.  This patient is unable to cut nails herself since the patient cannot reach there  nails.These nails are painful walking and wearing shoes.  This patient presents for at risk foot care today.  General Appearance  Alert, conversant and in no acute stress.  Vascular  Dorsalis pedis are not  palpable  bilaterally. Posterior tibial pulses are not palpable due to swelling.  Capillary return is within normal limits  bilaterally. Temperature is within normal limits  Bilaterally.  Swelling  B/L.  Neurologic  Senn-Weinstein monofilament wire test within normal limits  bilaterally. Muscle power within normal limits bilaterally.  Nails Thick disfigured discolored nails with subungual debris  from hallux to fifth toes bilaterally. No evidence of bacterial infection or drainage bilaterally.Fifth toenail unattached fifth toe left foot.  Orthopedic  No limitations of motion  feet .  No crepitus or effusions noted.  No bony pathology or digital deformities noted.  Skin  normotropic skin with no porokeratosis noted bilaterally.  No signs of infections or ulcers noted.     Onychomycosis  Pain in right toes  Pain in left toes  Consent was obtained for treatment procedures.   Mechanical debridement of nails 1-5  bilaterally performed with a nail nipper.  Filed with dremel without incident. No infection or ulcer.    Return office visit   3 months        Told patient to return for periodic foot care and evaluation due to potential at risk complications.   Jmya Uliano DPM  

## 2021-01-29 DIAGNOSIS — H52203 Unspecified astigmatism, bilateral: Secondary | ICD-10-CM | POA: Diagnosis not present

## 2021-01-29 DIAGNOSIS — H1789 Other corneal scars and opacities: Secondary | ICD-10-CM | POA: Diagnosis not present

## 2021-01-29 DIAGNOSIS — Z961 Presence of intraocular lens: Secondary | ICD-10-CM | POA: Diagnosis not present

## 2021-01-29 DIAGNOSIS — E119 Type 2 diabetes mellitus without complications: Secondary | ICD-10-CM | POA: Diagnosis not present

## 2021-02-04 DIAGNOSIS — G720 Drug-induced myopathy: Secondary | ICD-10-CM | POA: Diagnosis not present

## 2021-02-04 DIAGNOSIS — Z6836 Body mass index (BMI) 36.0-36.9, adult: Secondary | ICD-10-CM | POA: Diagnosis not present

## 2021-02-04 DIAGNOSIS — N183 Chronic kidney disease, stage 3 unspecified: Secondary | ICD-10-CM | POA: Diagnosis not present

## 2021-02-04 DIAGNOSIS — E1122 Type 2 diabetes mellitus with diabetic chronic kidney disease: Secondary | ICD-10-CM | POA: Diagnosis not present

## 2021-02-04 DIAGNOSIS — E039 Hypothyroidism, unspecified: Secondary | ICD-10-CM | POA: Diagnosis not present

## 2021-02-04 DIAGNOSIS — R2681 Unsteadiness on feet: Secondary | ICD-10-CM | POA: Diagnosis not present

## 2021-02-04 DIAGNOSIS — M1000 Idiopathic gout, unspecified site: Secondary | ICD-10-CM | POA: Diagnosis not present

## 2021-02-04 DIAGNOSIS — M810 Age-related osteoporosis without current pathological fracture: Secondary | ICD-10-CM | POA: Diagnosis not present

## 2021-02-04 DIAGNOSIS — I129 Hypertensive chronic kidney disease with stage 1 through stage 4 chronic kidney disease, or unspecified chronic kidney disease: Secondary | ICD-10-CM | POA: Diagnosis not present

## 2021-02-09 DIAGNOSIS — I10 Essential (primary) hypertension: Secondary | ICD-10-CM | POA: Diagnosis not present

## 2021-02-09 DIAGNOSIS — M810 Age-related osteoporosis without current pathological fracture: Secondary | ICD-10-CM | POA: Diagnosis not present

## 2021-02-09 DIAGNOSIS — I129 Hypertensive chronic kidney disease with stage 1 through stage 4 chronic kidney disease, or unspecified chronic kidney disease: Secondary | ICD-10-CM | POA: Diagnosis not present

## 2021-02-09 DIAGNOSIS — E1122 Type 2 diabetes mellitus with diabetic chronic kidney disease: Secondary | ICD-10-CM | POA: Diagnosis not present

## 2021-02-09 DIAGNOSIS — E039 Hypothyroidism, unspecified: Secondary | ICD-10-CM | POA: Diagnosis not present

## 2021-02-09 DIAGNOSIS — N183 Chronic kidney disease, stage 3 unspecified: Secondary | ICD-10-CM | POA: Diagnosis not present

## 2021-02-09 DIAGNOSIS — E785 Hyperlipidemia, unspecified: Secondary | ICD-10-CM | POA: Diagnosis not present

## 2021-04-28 ENCOUNTER — Ambulatory Visit (INDEPENDENT_AMBULATORY_CARE_PROVIDER_SITE_OTHER): Payer: Medicare Other | Admitting: Podiatry

## 2021-04-28 ENCOUNTER — Other Ambulatory Visit: Payer: Self-pay

## 2021-04-28 ENCOUNTER — Encounter: Payer: Self-pay | Admitting: Podiatry

## 2021-04-28 DIAGNOSIS — M79674 Pain in right toe(s): Secondary | ICD-10-CM | POA: Diagnosis not present

## 2021-04-28 DIAGNOSIS — Z853 Personal history of malignant neoplasm of breast: Secondary | ICD-10-CM | POA: Insufficient documentation

## 2021-04-28 DIAGNOSIS — M810 Age-related osteoporosis without current pathological fracture: Secondary | ICD-10-CM | POA: Insufficient documentation

## 2021-04-28 DIAGNOSIS — Z860100 Personal history of colon polyps, unspecified: Secondary | ICD-10-CM | POA: Insufficient documentation

## 2021-04-28 DIAGNOSIS — I129 Hypertensive chronic kidney disease with stage 1 through stage 4 chronic kidney disease, or unspecified chronic kidney disease: Secondary | ICD-10-CM | POA: Insufficient documentation

## 2021-04-28 DIAGNOSIS — M79675 Pain in left toe(s): Secondary | ICD-10-CM

## 2021-04-28 DIAGNOSIS — N189 Chronic kidney disease, unspecified: Secondary | ICD-10-CM | POA: Diagnosis not present

## 2021-04-28 DIAGNOSIS — H9110 Presbycusis, unspecified ear: Secondary | ICD-10-CM | POA: Insufficient documentation

## 2021-04-28 DIAGNOSIS — G720 Drug-induced myopathy: Secondary | ICD-10-CM | POA: Insufficient documentation

## 2021-04-28 DIAGNOSIS — E119 Type 2 diabetes mellitus without complications: Secondary | ICD-10-CM

## 2021-04-28 DIAGNOSIS — B351 Tinea unguium: Secondary | ICD-10-CM

## 2021-04-28 DIAGNOSIS — R269 Unspecified abnormalities of gait and mobility: Secondary | ICD-10-CM | POA: Insufficient documentation

## 2021-04-28 DIAGNOSIS — D631 Anemia in chronic kidney disease: Secondary | ICD-10-CM | POA: Insufficient documentation

## 2021-04-28 DIAGNOSIS — E1121 Type 2 diabetes mellitus with diabetic nephropathy: Secondary | ICD-10-CM | POA: Insufficient documentation

## 2021-04-28 DIAGNOSIS — R899 Unspecified abnormal finding in specimens from other organs, systems and tissues: Secondary | ICD-10-CM | POA: Insufficient documentation

## 2021-04-28 DIAGNOSIS — G629 Polyneuropathy, unspecified: Secondary | ICD-10-CM | POA: Insufficient documentation

## 2021-04-28 DIAGNOSIS — E785 Hyperlipidemia, unspecified: Secondary | ICD-10-CM | POA: Insufficient documentation

## 2021-04-28 NOTE — Progress Notes (Signed)
This patient returns to my office for at risk foot care.  This patient requires this care by a professional since this patient will be at risk due to having  Diabetes with kidney disease.  This patient is unable to cut nails herself since the patient cannot reach there  nails.These nails are painful walking and wearing shoes.  This patient presents for at risk foot care today.  General Appearance  Alert, conversant and in no acute stress.  Vascular  Dorsalis pedis are not  palpable  bilaterally. Posterior tibial pulses are not palpable due to swelling.  Capillary return is within normal limits  bilaterally. Temperature is within normal limits  Bilaterally.  Swelling  B/L.  Neurologic  Senn-Weinstein monofilament wire test within normal limits  bilaterally. Muscle power within normal limits bilaterally.  Nails Thick disfigured discolored nails with subungual debris  from hallux to fifth toes bilaterally. No evidence of bacterial infection or drainage bilaterally.Fifth toenail unattached fifth toe left foot.  Orthopedic  No limitations of motion  feet .  No crepitus or effusions noted.  No bony pathology or digital deformities noted.  Skin  normotropic skin with no porokeratosis noted bilaterally.  No signs of infections or ulcers noted.     Onychomycosis  Pain in right toes  Pain in left toes  Consent was obtained for treatment procedures.   Mechanical debridement of nails 1-5  bilaterally performed with a nail nipper.  Filed with dremel without incident. No infection or ulcer.    Return office visit   3 months        Told patient to return for periodic foot care and evaluation due to potential at risk complications.   Dovie Kapusta DPM  

## 2021-05-01 DIAGNOSIS — Z20822 Contact with and (suspected) exposure to covid-19: Secondary | ICD-10-CM | POA: Diagnosis not present

## 2021-05-27 DIAGNOSIS — E039 Hypothyroidism, unspecified: Secondary | ICD-10-CM | POA: Diagnosis not present

## 2021-05-27 DIAGNOSIS — Z6836 Body mass index (BMI) 36.0-36.9, adult: Secondary | ICD-10-CM | POA: Diagnosis not present

## 2021-05-27 DIAGNOSIS — I10 Essential (primary) hypertension: Secondary | ICD-10-CM | POA: Diagnosis not present

## 2021-06-28 DIAGNOSIS — Z23 Encounter for immunization: Secondary | ICD-10-CM | POA: Diagnosis not present

## 2021-07-01 DIAGNOSIS — G4733 Obstructive sleep apnea (adult) (pediatric): Secondary | ICD-10-CM | POA: Diagnosis not present

## 2021-07-30 ENCOUNTER — Other Ambulatory Visit: Payer: Self-pay

## 2021-07-30 ENCOUNTER — Ambulatory Visit (INDEPENDENT_AMBULATORY_CARE_PROVIDER_SITE_OTHER): Payer: Medicare Other | Admitting: Podiatry

## 2021-07-30 ENCOUNTER — Encounter: Payer: Self-pay | Admitting: Podiatry

## 2021-07-30 DIAGNOSIS — N189 Chronic kidney disease, unspecified: Secondary | ICD-10-CM

## 2021-07-30 DIAGNOSIS — M79674 Pain in right toe(s): Secondary | ICD-10-CM

## 2021-07-30 DIAGNOSIS — B351 Tinea unguium: Secondary | ICD-10-CM | POA: Diagnosis not present

## 2021-07-30 DIAGNOSIS — E119 Type 2 diabetes mellitus without complications: Secondary | ICD-10-CM

## 2021-07-30 DIAGNOSIS — M79675 Pain in left toe(s): Secondary | ICD-10-CM

## 2021-07-30 NOTE — Progress Notes (Signed)
This patient returns to my office for at risk foot care.  This patient requires this care by a professional since this patient will be at risk due to having  Diabetes with kidney disease.  This patient is unable to cut nails herself since the patient cannot reach there  nails.These nails are painful walking and wearing shoes.  This patient presents for at risk foot care today.  General Appearance  Alert, conversant and in no acute stress.  Vascular  Dorsalis pedis are not  palpable  bilaterally. Posterior tibial pulses are not palpable due to swelling.  Capillary return is within normal limits  bilaterally. Temperature is within normal limits  Bilaterally.  Swelling  B/L.  Neurologic  Senn-Weinstein monofilament wire test within normal limits  bilaterally. Muscle power within normal limits bilaterally.  Nails Thick disfigured discolored nails with subungual debris  from hallux to fifth toes bilaterally. No evidence of bacterial infection or drainage bilaterally.Fifth toenail unattached fifth toe left foot.  Orthopedic  No limitations of motion  feet .  No crepitus or effusions noted.  No bony pathology or digital deformities noted.  Skin  normotropic skin with no porokeratosis noted bilaterally.  No signs of infections or ulcers noted.     Onychomycosis  Pain in right toes  Pain in left toes  Consent was obtained for treatment procedures.   Mechanical debridement of nails 1-5  bilaterally performed with a nail nipper.  Filed with dremel without incident. No infection or ulcer.    Return office visit   3 months        Told patient to return for periodic foot care and evaluation due to potential at risk complications.   Maahi Lannan DPM  

## 2021-08-02 DIAGNOSIS — Z23 Encounter for immunization: Secondary | ICD-10-CM | POA: Diagnosis not present

## 2021-08-02 DIAGNOSIS — M1000 Idiopathic gout, unspecified site: Secondary | ICD-10-CM | POA: Diagnosis not present

## 2021-08-02 DIAGNOSIS — E1122 Type 2 diabetes mellitus with diabetic chronic kidney disease: Secondary | ICD-10-CM | POA: Diagnosis not present

## 2021-08-02 DIAGNOSIS — I129 Hypertensive chronic kidney disease with stage 1 through stage 4 chronic kidney disease, or unspecified chronic kidney disease: Secondary | ICD-10-CM | POA: Diagnosis not present

## 2021-08-02 DIAGNOSIS — M810 Age-related osteoporosis without current pathological fracture: Secondary | ICD-10-CM | POA: Diagnosis not present

## 2021-08-02 DIAGNOSIS — N183 Chronic kidney disease, stage 3 unspecified: Secondary | ICD-10-CM | POA: Diagnosis not present

## 2021-08-02 DIAGNOSIS — E039 Hypothyroidism, unspecified: Secondary | ICD-10-CM | POA: Diagnosis not present

## 2021-08-27 DIAGNOSIS — M810 Age-related osteoporosis without current pathological fracture: Secondary | ICD-10-CM | POA: Diagnosis not present

## 2021-08-27 DIAGNOSIS — E1122 Type 2 diabetes mellitus with diabetic chronic kidney disease: Secondary | ICD-10-CM | POA: Diagnosis not present

## 2021-08-27 DIAGNOSIS — E039 Hypothyroidism, unspecified: Secondary | ICD-10-CM | POA: Diagnosis not present

## 2021-08-27 DIAGNOSIS — N183 Chronic kidney disease, stage 3 unspecified: Secondary | ICD-10-CM | POA: Diagnosis not present

## 2021-08-27 DIAGNOSIS — I10 Essential (primary) hypertension: Secondary | ICD-10-CM | POA: Diagnosis not present

## 2021-08-27 DIAGNOSIS — E785 Hyperlipidemia, unspecified: Secondary | ICD-10-CM | POA: Diagnosis not present

## 2021-08-27 DIAGNOSIS — I129 Hypertensive chronic kidney disease with stage 1 through stage 4 chronic kidney disease, or unspecified chronic kidney disease: Secondary | ICD-10-CM | POA: Diagnosis not present

## 2021-09-10 DIAGNOSIS — Z23 Encounter for immunization: Secondary | ICD-10-CM | POA: Diagnosis not present

## 2021-10-06 DIAGNOSIS — E039 Hypothyroidism, unspecified: Secondary | ICD-10-CM | POA: Diagnosis not present

## 2021-10-06 DIAGNOSIS — Z0000 Encounter for general adult medical examination without abnormal findings: Secondary | ICD-10-CM | POA: Diagnosis not present

## 2021-10-06 DIAGNOSIS — E1122 Type 2 diabetes mellitus with diabetic chronic kidney disease: Secondary | ICD-10-CM | POA: Diagnosis not present

## 2021-10-06 DIAGNOSIS — Z1389 Encounter for screening for other disorder: Secondary | ICD-10-CM | POA: Diagnosis not present

## 2021-10-29 ENCOUNTER — Encounter: Payer: Self-pay | Admitting: Podiatry

## 2021-10-29 ENCOUNTER — Other Ambulatory Visit: Payer: Self-pay

## 2021-10-29 ENCOUNTER — Ambulatory Visit (INDEPENDENT_AMBULATORY_CARE_PROVIDER_SITE_OTHER): Payer: Medicare Other | Admitting: Podiatry

## 2021-10-29 DIAGNOSIS — N189 Chronic kidney disease, unspecified: Secondary | ICD-10-CM

## 2021-10-29 DIAGNOSIS — M79674 Pain in right toe(s): Secondary | ICD-10-CM

## 2021-10-29 DIAGNOSIS — M79675 Pain in left toe(s): Secondary | ICD-10-CM | POA: Diagnosis not present

## 2021-10-29 DIAGNOSIS — B351 Tinea unguium: Secondary | ICD-10-CM

## 2021-10-29 DIAGNOSIS — E119 Type 2 diabetes mellitus without complications: Secondary | ICD-10-CM

## 2021-10-29 NOTE — Progress Notes (Signed)
This patient returns to my office for at risk foot care.  This patient requires this care by a professional since this patient will be at risk due to having  Diabetes with kidney disease.  This patient is unable to cut nails herself since the patient cannot reach there  nails.These nails are painful walking and wearing shoes.  This patient presents for at risk foot care today.  General Appearance  Alert, conversant and in no acute stress.  Vascular  Dorsalis pedis are not  palpable  bilaterally. Posterior tibial pulses are not palpable due to swelling.  Capillary return is within normal limits  bilaterally. Temperature is within normal limits  Bilaterally.  Swelling  B/L.  Neurologic  Senn-Weinstein monofilament wire test within normal limits  bilaterally. Muscle power within normal limits bilaterally.  Nails Thick disfigured discolored nails with subungual debris  from hallux to fifth toes bilaterally. No evidence of bacterial infection or drainage bilaterally.Fifth toenail unattached fifth toe left foot.  Orthopedic  No limitations of motion  feet .  No crepitus or effusions noted.  No bony pathology or digital deformities noted.  Skin  normotropic skin with no porokeratosis noted bilaterally.  No signs of infections or ulcers noted.     Onychomycosis  Pain in right toes  Pain in left toes  Consent was obtained for treatment procedures.   Mechanical debridement of nails 1-5  bilaterally performed with a nail nipper.  Filed with dremel without incident. No infection or ulcer.    Return office visit   3 months        Told patient to return for periodic foot care and evaluation due to potential at risk complications.   Rhiley Tarver DPM  

## 2021-11-03 DIAGNOSIS — G4733 Obstructive sleep apnea (adult) (pediatric): Secondary | ICD-10-CM | POA: Diagnosis not present

## 2021-11-25 DIAGNOSIS — M810 Age-related osteoporosis without current pathological fracture: Secondary | ICD-10-CM | POA: Diagnosis not present

## 2021-11-25 DIAGNOSIS — I129 Hypertensive chronic kidney disease with stage 1 through stage 4 chronic kidney disease, or unspecified chronic kidney disease: Secondary | ICD-10-CM | POA: Diagnosis not present

## 2021-11-25 DIAGNOSIS — E785 Hyperlipidemia, unspecified: Secondary | ICD-10-CM | POA: Diagnosis not present

## 2021-11-25 DIAGNOSIS — I10 Essential (primary) hypertension: Secondary | ICD-10-CM | POA: Diagnosis not present

## 2021-11-25 DIAGNOSIS — E1122 Type 2 diabetes mellitus with diabetic chronic kidney disease: Secondary | ICD-10-CM | POA: Diagnosis not present

## 2021-11-25 DIAGNOSIS — N183 Chronic kidney disease, stage 3 unspecified: Secondary | ICD-10-CM | POA: Diagnosis not present

## 2021-11-25 DIAGNOSIS — E039 Hypothyroidism, unspecified: Secondary | ICD-10-CM | POA: Diagnosis not present

## 2021-12-08 DIAGNOSIS — Z20822 Contact with and (suspected) exposure to covid-19: Secondary | ICD-10-CM | POA: Diagnosis not present

## 2022-01-31 ENCOUNTER — Encounter: Payer: Self-pay | Admitting: Podiatry

## 2022-01-31 ENCOUNTER — Ambulatory Visit (INDEPENDENT_AMBULATORY_CARE_PROVIDER_SITE_OTHER): Payer: Medicare Other | Admitting: Podiatry

## 2022-01-31 DIAGNOSIS — N189 Chronic kidney disease, unspecified: Secondary | ICD-10-CM | POA: Diagnosis not present

## 2022-01-31 DIAGNOSIS — M79675 Pain in left toe(s): Secondary | ICD-10-CM | POA: Diagnosis not present

## 2022-01-31 DIAGNOSIS — M79674 Pain in right toe(s): Secondary | ICD-10-CM

## 2022-01-31 DIAGNOSIS — E119 Type 2 diabetes mellitus without complications: Secondary | ICD-10-CM

## 2022-01-31 DIAGNOSIS — B351 Tinea unguium: Secondary | ICD-10-CM | POA: Diagnosis not present

## 2022-01-31 NOTE — Progress Notes (Signed)
This patient returns to my office for at risk foot care.  This patient requires this care by a professional since this patient will be at risk due to having  Diabetes with kidney disease.  This patient is unable to cut nails herself since the patient cannot reach there  nails.These nails are painful walking and wearing shoes.  This patient presents for at risk foot care today.  General Appearance  Alert, conversant and in no acute stress.  Vascular  Dorsalis pedis are not  palpable  bilaterally. Posterior tibial pulses are not palpable due to swelling.  Capillary return is within normal limits  bilaterally. Temperature is within normal limits  Bilaterally.  Swelling  B/L.  Neurologic  Senn-Weinstein monofilament wire test within normal limits  bilaterally. Muscle power within normal limits bilaterally.  Nails Thick disfigured discolored nails with subungual debris  from hallux to fifth toes bilaterally. No evidence of bacterial infection or drainage bilaterally.Fifth toenail unattached fifth toe left foot.  Orthopedic  No limitations of motion  feet .  No crepitus or effusions noted.  No bony pathology or digital deformities noted.  Skin  normotropic skin with no porokeratosis noted bilaterally.  No signs of infections or ulcers noted.     Onychomycosis  Pain in right toes  Pain in left toes  Consent was obtained for treatment procedures.   Mechanical debridement of nails 1-5  bilaterally performed with a nail nipper.  Filed with dremel without incident. No infection or ulcer.    Return office visit   3 months        Told patient to return for periodic foot care and evaluation due to potential at risk complications.   Catlin Doria DPM  

## 2022-02-01 DIAGNOSIS — H401131 Primary open-angle glaucoma, bilateral, mild stage: Secondary | ICD-10-CM | POA: Diagnosis not present

## 2022-02-01 DIAGNOSIS — E119 Type 2 diabetes mellitus without complications: Secondary | ICD-10-CM | POA: Diagnosis not present

## 2022-02-01 DIAGNOSIS — H1789 Other corneal scars and opacities: Secondary | ICD-10-CM | POA: Diagnosis not present

## 2022-02-01 DIAGNOSIS — H52203 Unspecified astigmatism, bilateral: Secondary | ICD-10-CM | POA: Diagnosis not present

## 2022-02-02 DIAGNOSIS — M1000 Idiopathic gout, unspecified site: Secondary | ICD-10-CM | POA: Diagnosis not present

## 2022-02-02 DIAGNOSIS — N183 Chronic kidney disease, stage 3 unspecified: Secondary | ICD-10-CM | POA: Diagnosis not present

## 2022-02-02 DIAGNOSIS — G720 Drug-induced myopathy: Secondary | ICD-10-CM | POA: Diagnosis not present

## 2022-02-02 DIAGNOSIS — H9113 Presbycusis, bilateral: Secondary | ICD-10-CM | POA: Diagnosis not present

## 2022-02-02 DIAGNOSIS — M810 Age-related osteoporosis without current pathological fracture: Secondary | ICD-10-CM | POA: Diagnosis not present

## 2022-02-02 DIAGNOSIS — I129 Hypertensive chronic kidney disease with stage 1 through stage 4 chronic kidney disease, or unspecified chronic kidney disease: Secondary | ICD-10-CM | POA: Diagnosis not present

## 2022-02-02 DIAGNOSIS — E1122 Type 2 diabetes mellitus with diabetic chronic kidney disease: Secondary | ICD-10-CM | POA: Diagnosis not present

## 2022-02-02 DIAGNOSIS — E785 Hyperlipidemia, unspecified: Secondary | ICD-10-CM | POA: Diagnosis not present

## 2022-02-02 DIAGNOSIS — E039 Hypothyroidism, unspecified: Secondary | ICD-10-CM | POA: Diagnosis not present

## 2022-02-02 DIAGNOSIS — Z6836 Body mass index (BMI) 36.0-36.9, adult: Secondary | ICD-10-CM | POA: Diagnosis not present

## 2022-02-08 DIAGNOSIS — Z20822 Contact with and (suspected) exposure to covid-19: Secondary | ICD-10-CM | POA: Diagnosis not present

## 2022-03-05 DIAGNOSIS — Z20822 Contact with and (suspected) exposure to covid-19: Secondary | ICD-10-CM | POA: Diagnosis not present

## 2022-04-04 DIAGNOSIS — E1122 Type 2 diabetes mellitus with diabetic chronic kidney disease: Secondary | ICD-10-CM | POA: Diagnosis not present

## 2022-04-04 DIAGNOSIS — E785 Hyperlipidemia, unspecified: Secondary | ICD-10-CM | POA: Diagnosis not present

## 2022-04-04 DIAGNOSIS — I129 Hypertensive chronic kidney disease with stage 1 through stage 4 chronic kidney disease, or unspecified chronic kidney disease: Secondary | ICD-10-CM | POA: Diagnosis not present

## 2022-04-04 DIAGNOSIS — E039 Hypothyroidism, unspecified: Secondary | ICD-10-CM | POA: Diagnosis not present

## 2022-04-04 DIAGNOSIS — G720 Drug-induced myopathy: Secondary | ICD-10-CM | POA: Diagnosis not present

## 2022-05-04 ENCOUNTER — Ambulatory Visit (INDEPENDENT_AMBULATORY_CARE_PROVIDER_SITE_OTHER): Payer: Medicare Other | Admitting: Podiatry

## 2022-05-04 ENCOUNTER — Encounter: Payer: Self-pay | Admitting: Podiatry

## 2022-05-04 DIAGNOSIS — N189 Chronic kidney disease, unspecified: Secondary | ICD-10-CM

## 2022-05-04 DIAGNOSIS — E119 Type 2 diabetes mellitus without complications: Secondary | ICD-10-CM | POA: Diagnosis not present

## 2022-05-04 DIAGNOSIS — B351 Tinea unguium: Secondary | ICD-10-CM

## 2022-05-04 DIAGNOSIS — M79674 Pain in right toe(s): Secondary | ICD-10-CM | POA: Diagnosis not present

## 2022-05-04 DIAGNOSIS — M79675 Pain in left toe(s): Secondary | ICD-10-CM

## 2022-05-04 NOTE — Progress Notes (Signed)
This patient returns to my office for at risk foot care.  This patient requires this care by a professional since this patient will be at risk due to having  Diabetes with kidney disease.  This patient is unable to cut nails herself since the patient cannot reach there  nails.These nails are painful walking and wearing shoes.  This patient presents for at risk foot care today.  General Appearance  Alert, conversant and in no acute stress.  Vascular  Dorsalis pedis are not  palpable  bilaterally. Posterior tibial pulses are not palpable due to swelling.  Capillary return is within normal limits  bilaterally. Temperature is within normal limits  Bilaterally.  Swelling  B/L.  Neurologic  Senn-Weinstein monofilament wire test within normal limits  bilaterally. Muscle power within normal limits bilaterally.  Nails Thick disfigured discolored nails with subungual debris  from hallux to fifth toes bilaterally. No evidence of bacterial infection or drainage bilaterally.Fifth toenail unattached fifth toe left foot.  Orthopedic  No limitations of motion  feet .  No crepitus or effusions noted.  No bony pathology or digital deformities noted.  Skin  normotropic skin with no porokeratosis noted bilaterally.  No signs of infections or ulcers noted.     Onychomycosis  Pain in right toes  Pain in left toes  Consent was obtained for treatment procedures.   Mechanical debridement of nails 1-5  bilaterally performed with a nail nipper.  Filed with dremel without incident. No infection or ulcer.    Return office visit   3 months        Told patient to return for periodic foot care and evaluation due to potential at risk complications.   Dorell Gatlin DPM  

## 2022-08-01 DIAGNOSIS — E1122 Type 2 diabetes mellitus with diabetic chronic kidney disease: Secondary | ICD-10-CM | POA: Diagnosis not present

## 2022-08-01 DIAGNOSIS — E039 Hypothyroidism, unspecified: Secondary | ICD-10-CM | POA: Diagnosis not present

## 2022-08-01 DIAGNOSIS — Z23 Encounter for immunization: Secondary | ICD-10-CM | POA: Diagnosis not present

## 2022-08-01 DIAGNOSIS — E785 Hyperlipidemia, unspecified: Secondary | ICD-10-CM | POA: Diagnosis not present

## 2022-08-01 DIAGNOSIS — I129 Hypertensive chronic kidney disease with stage 1 through stage 4 chronic kidney disease, or unspecified chronic kidney disease: Secondary | ICD-10-CM | POA: Diagnosis not present

## 2022-08-01 DIAGNOSIS — M1000 Idiopathic gout, unspecified site: Secondary | ICD-10-CM | POA: Diagnosis not present

## 2022-08-01 DIAGNOSIS — Z9989 Dependence on other enabling machines and devices: Secondary | ICD-10-CM | POA: Diagnosis not present

## 2022-08-01 DIAGNOSIS — G720 Drug-induced myopathy: Secondary | ICD-10-CM | POA: Diagnosis not present

## 2022-08-01 DIAGNOSIS — M810 Age-related osteoporosis without current pathological fracture: Secondary | ICD-10-CM | POA: Diagnosis not present

## 2022-08-01 DIAGNOSIS — D631 Anemia in chronic kidney disease: Secondary | ICD-10-CM | POA: Diagnosis not present

## 2022-08-01 DIAGNOSIS — N1832 Chronic kidney disease, stage 3b: Secondary | ICD-10-CM | POA: Diagnosis not present

## 2022-08-03 DIAGNOSIS — H401131 Primary open-angle glaucoma, bilateral, mild stage: Secondary | ICD-10-CM | POA: Diagnosis not present

## 2022-08-05 ENCOUNTER — Ambulatory Visit (INDEPENDENT_AMBULATORY_CARE_PROVIDER_SITE_OTHER): Payer: Medicare Other | Admitting: Podiatry

## 2022-08-05 DIAGNOSIS — E119 Type 2 diabetes mellitus without complications: Secondary | ICD-10-CM

## 2022-08-05 DIAGNOSIS — M79675 Pain in left toe(s): Secondary | ICD-10-CM

## 2022-08-05 DIAGNOSIS — G629 Polyneuropathy, unspecified: Secondary | ICD-10-CM

## 2022-08-05 DIAGNOSIS — B351 Tinea unguium: Secondary | ICD-10-CM

## 2022-08-05 DIAGNOSIS — M79674 Pain in right toe(s): Secondary | ICD-10-CM

## 2022-08-05 DIAGNOSIS — N189 Chronic kidney disease, unspecified: Secondary | ICD-10-CM

## 2022-08-05 NOTE — Progress Notes (Signed)
This patient returns to my office for at risk foot care.  This patient requires this care by a professional since this patient will be at risk due to having  Diabetes with kidney disease.  This patient is unable to cut nails herself since the patient cannot reach there  nails.These nails are painful walking and wearing shoes.  This patient presents for at risk foot care today.  General Appearance  Alert, conversant and in no acute stress.  Vascular  Dorsalis pedis are not  palpable  bilaterally. Posterior tibial pulses are not palpable due to swelling.  Capillary return is within normal limits  bilaterally. Temperature is within normal limits  Bilaterally.  Swelling  B/L.  Neurologic  Senn-Weinstein monofilament wire test within normal limits  bilaterally. Muscle power within normal limits bilaterally.  Nails Thick disfigured discolored nails with subungual debris  from hallux to fifth toes bilaterally. No evidence of bacterial infection or drainage bilaterally.Fifth toenail unattached fifth toe left foot.  Orthopedic  No limitations of motion  feet .  No crepitus or effusions noted.  No bony pathology or digital deformities noted.  Skin  normotropic skin with no porokeratosis noted bilaterally.  No signs of infections or ulcers noted.     Onychomycosis  Pain in right toes  Pain in left toes  Consent was obtained for treatment procedures.   Mechanical debridement of nails 1-5  bilaterally performed with a nail nipper.  Filed with dremel without incident. No infection or ulcer.    Return office visit   3 months        Told patient to return for periodic foot care and evaluation due to potential at risk complications.   Kelso Bibby DPM  

## 2022-10-07 DIAGNOSIS — Z23 Encounter for immunization: Secondary | ICD-10-CM | POA: Diagnosis not present

## 2022-10-07 DIAGNOSIS — Z0000 Encounter for general adult medical examination without abnormal findings: Secondary | ICD-10-CM | POA: Diagnosis not present

## 2022-10-07 DIAGNOSIS — Z1389 Encounter for screening for other disorder: Secondary | ICD-10-CM | POA: Diagnosis not present

## 2022-10-07 DIAGNOSIS — E039 Hypothyroidism, unspecified: Secondary | ICD-10-CM | POA: Diagnosis not present

## 2022-11-09 DIAGNOSIS — H9190 Unspecified hearing loss, unspecified ear: Secondary | ICD-10-CM | POA: Diagnosis not present

## 2022-11-09 DIAGNOSIS — G4733 Obstructive sleep apnea (adult) (pediatric): Secondary | ICD-10-CM | POA: Diagnosis not present

## 2022-11-11 ENCOUNTER — Encounter: Payer: Self-pay | Admitting: Podiatry

## 2022-11-11 ENCOUNTER — Ambulatory Visit (INDEPENDENT_AMBULATORY_CARE_PROVIDER_SITE_OTHER): Payer: Medicare Other | Admitting: Podiatry

## 2022-11-11 DIAGNOSIS — N189 Chronic kidney disease, unspecified: Secondary | ICD-10-CM | POA: Diagnosis not present

## 2022-11-11 DIAGNOSIS — B351 Tinea unguium: Secondary | ICD-10-CM

## 2022-11-11 DIAGNOSIS — E119 Type 2 diabetes mellitus without complications: Secondary | ICD-10-CM

## 2022-11-11 DIAGNOSIS — M79675 Pain in left toe(s): Secondary | ICD-10-CM

## 2022-11-11 DIAGNOSIS — M79674 Pain in right toe(s): Secondary | ICD-10-CM | POA: Diagnosis not present

## 2022-11-11 NOTE — Progress Notes (Signed)
This patient returns to my office for at risk foot care.  This patient requires this care by a professional since this patient will be at risk due to having  Diabetes with kidney disease.  This patient is unable to cut nails herself since the patient cannot reach there  nails.These nails are painful walking and wearing shoes.  This patient presents for at risk foot care today.  General Appearance  Alert, conversant and in no acute stress.  Vascular  Dorsalis pedis are not  palpable  bilaterally. Posterior tibial pulses are not palpable due to swelling.  Capillary return is within normal limits  bilaterally. Temperature is within normal limits  Bilaterally.  Swelling  B/L.  Neurologic  Senn-Weinstein monofilament wire test within normal limits  bilaterally. Muscle power within normal limits bilaterally.  Nails Thick disfigured discolored nails with subungual debris  from hallux to fifth toes bilaterally. No evidence of bacterial infection or drainage bilaterally.Fifth toenail unattached fifth toe left foot.  Orthopedic  No limitations of motion  feet .  No crepitus or effusions noted.  No bony pathology or digital deformities noted.  Skin  normotropic skin with no porokeratosis noted bilaterally.  No signs of infections or ulcers noted.     Onychomycosis  Pain in right toes  Pain in left toes  Consent was obtained for treatment procedures.   Mechanical debridement of nails 1-5  bilaterally performed with a nail nipper.  Filed with dremel without incident. No infection or ulcer.    Return office visit   3 months        Told patient to return for periodic foot care and evaluation due to potential at risk complications.   Gardiner Barefoot DPM

## 2023-02-06 DIAGNOSIS — N1832 Chronic kidney disease, stage 3b: Secondary | ICD-10-CM | POA: Diagnosis not present

## 2023-02-06 DIAGNOSIS — G720 Drug-induced myopathy: Secondary | ICD-10-CM | POA: Diagnosis not present

## 2023-02-06 DIAGNOSIS — E1122 Type 2 diabetes mellitus with diabetic chronic kidney disease: Secondary | ICD-10-CM | POA: Diagnosis not present

## 2023-02-06 DIAGNOSIS — Z23 Encounter for immunization: Secondary | ICD-10-CM | POA: Diagnosis not present

## 2023-02-06 DIAGNOSIS — D631 Anemia in chronic kidney disease: Secondary | ICD-10-CM | POA: Diagnosis not present

## 2023-02-06 DIAGNOSIS — M1000 Idiopathic gout, unspecified site: Secondary | ICD-10-CM | POA: Diagnosis not present

## 2023-02-06 DIAGNOSIS — E039 Hypothyroidism, unspecified: Secondary | ICD-10-CM | POA: Diagnosis not present

## 2023-02-06 DIAGNOSIS — Z9989 Dependence on other enabling machines and devices: Secondary | ICD-10-CM | POA: Diagnosis not present

## 2023-02-06 DIAGNOSIS — Z6837 Body mass index (BMI) 37.0-37.9, adult: Secondary | ICD-10-CM | POA: Diagnosis not present

## 2023-02-06 DIAGNOSIS — I129 Hypertensive chronic kidney disease with stage 1 through stage 4 chronic kidney disease, or unspecified chronic kidney disease: Secondary | ICD-10-CM | POA: Diagnosis not present

## 2023-02-10 ENCOUNTER — Ambulatory Visit (INDEPENDENT_AMBULATORY_CARE_PROVIDER_SITE_OTHER): Payer: Medicare Other | Admitting: Podiatry

## 2023-02-10 ENCOUNTER — Encounter: Payer: Self-pay | Admitting: Podiatry

## 2023-02-10 DIAGNOSIS — M79674 Pain in right toe(s): Secondary | ICD-10-CM

## 2023-02-10 DIAGNOSIS — B351 Tinea unguium: Secondary | ICD-10-CM

## 2023-02-10 DIAGNOSIS — M79675 Pain in left toe(s): Secondary | ICD-10-CM | POA: Diagnosis not present

## 2023-02-10 DIAGNOSIS — E119 Type 2 diabetes mellitus without complications: Secondary | ICD-10-CM

## 2023-02-10 NOTE — Progress Notes (Signed)
This patient returns to my office for at risk foot care.  This patient requires this care by a professional since this patient will be at risk due to having  Diabetes with kidney disease.  This patient is unable to cut nails herself since the patient cannot reach there  nails.These nails are painful walking and wearing shoes.  This patient presents for at risk foot care today.  General Appearance  Alert, conversant and in no acute stress.  Vascular  Dorsalis pedis are not  palpable  bilaterally. Posterior tibial pulses are not palpable due to swelling.  Capillary return is within normal limits  bilaterally. Temperature is within normal limits  Bilaterally.  Swelling  B/L.  Neurologic  Senn-Weinstein monofilament wire test within normal limits  bilaterally. Muscle power within normal limits bilaterally.  Nails Thick disfigured discolored nails with subungual debris  from hallux to fifth toes bilaterally. No evidence of bacterial infection or drainage bilaterally.Fifth toenail unattached fifth toe left foot.  Orthopedic  No limitations of motion  feet .  No crepitus or effusions noted.  No bony pathology or digital deformities noted.  Skin  normotropic skin with no porokeratosis noted bilaterally.  No signs of infections or ulcers noted.     Onychomycosis  Pain in right toes  Pain in left toes  Consent was obtained for treatment procedures.   Mechanical debridement of nails 1-5  bilaterally performed with a nail nipper.  Filed with dremel without incident. No infection or ulcer.    Return office visit   3 months        Told patient to return for periodic foot care and evaluation due to potential at risk complications.   Shanteria Laye DPM  

## 2023-02-14 DIAGNOSIS — H52203 Unspecified astigmatism, bilateral: Secondary | ICD-10-CM | POA: Diagnosis not present

## 2023-02-14 DIAGNOSIS — E119 Type 2 diabetes mellitus without complications: Secondary | ICD-10-CM | POA: Diagnosis not present

## 2023-02-14 DIAGNOSIS — Z961 Presence of intraocular lens: Secondary | ICD-10-CM | POA: Diagnosis not present

## 2023-02-14 DIAGNOSIS — H401131 Primary open-angle glaucoma, bilateral, mild stage: Secondary | ICD-10-CM | POA: Diagnosis not present

## 2023-04-10 DIAGNOSIS — C44629 Squamous cell carcinoma of skin of left upper limb, including shoulder: Secondary | ICD-10-CM | POA: Diagnosis not present

## 2023-04-10 DIAGNOSIS — D485 Neoplasm of uncertain behavior of skin: Secondary | ICD-10-CM | POA: Diagnosis not present

## 2023-04-10 DIAGNOSIS — C4359 Malignant melanoma of other part of trunk: Secondary | ICD-10-CM | POA: Diagnosis not present

## 2023-04-10 DIAGNOSIS — Z08 Encounter for follow-up examination after completed treatment for malignant neoplasm: Secondary | ICD-10-CM | POA: Diagnosis not present

## 2023-04-10 DIAGNOSIS — D1801 Hemangioma of skin and subcutaneous tissue: Secondary | ICD-10-CM | POA: Diagnosis not present

## 2023-04-10 DIAGNOSIS — C44329 Squamous cell carcinoma of skin of other parts of face: Secondary | ICD-10-CM | POA: Diagnosis not present

## 2023-04-10 DIAGNOSIS — C44311 Basal cell carcinoma of skin of nose: Secondary | ICD-10-CM | POA: Diagnosis not present

## 2023-04-10 DIAGNOSIS — Z85820 Personal history of malignant melanoma of skin: Secondary | ICD-10-CM | POA: Diagnosis not present

## 2023-04-10 DIAGNOSIS — Z85828 Personal history of other malignant neoplasm of skin: Secondary | ICD-10-CM | POA: Diagnosis not present

## 2023-04-10 DIAGNOSIS — R229 Localized swelling, mass and lump, unspecified: Secondary | ICD-10-CM | POA: Diagnosis not present

## 2023-04-10 DIAGNOSIS — C4442 Squamous cell carcinoma of skin of scalp and neck: Secondary | ICD-10-CM | POA: Diagnosis not present

## 2023-04-10 DIAGNOSIS — L821 Other seborrheic keratosis: Secondary | ICD-10-CM | POA: Diagnosis not present

## 2023-04-10 DIAGNOSIS — L814 Other melanin hyperpigmentation: Secondary | ICD-10-CM | POA: Diagnosis not present

## 2023-04-19 DIAGNOSIS — C4442 Squamous cell carcinoma of skin of scalp and neck: Secondary | ICD-10-CM | POA: Diagnosis not present

## 2023-04-25 DIAGNOSIS — E039 Hypothyroidism, unspecified: Secondary | ICD-10-CM | POA: Diagnosis not present

## 2023-04-25 DIAGNOSIS — M810 Age-related osteoporosis without current pathological fracture: Secondary | ICD-10-CM | POA: Diagnosis not present

## 2023-04-25 DIAGNOSIS — N1832 Chronic kidney disease, stage 3b: Secondary | ICD-10-CM | POA: Diagnosis not present

## 2023-04-25 DIAGNOSIS — E1122 Type 2 diabetes mellitus with diabetic chronic kidney disease: Secondary | ICD-10-CM | POA: Diagnosis not present

## 2023-04-25 DIAGNOSIS — E785 Hyperlipidemia, unspecified: Secondary | ICD-10-CM | POA: Diagnosis not present

## 2023-04-25 DIAGNOSIS — I129 Hypertensive chronic kidney disease with stage 1 through stage 4 chronic kidney disease, or unspecified chronic kidney disease: Secondary | ICD-10-CM | POA: Diagnosis not present

## 2023-05-02 DIAGNOSIS — D0439 Carcinoma in situ of skin of other parts of face: Secondary | ICD-10-CM | POA: Diagnosis not present

## 2023-05-12 ENCOUNTER — Ambulatory Visit (INDEPENDENT_AMBULATORY_CARE_PROVIDER_SITE_OTHER): Payer: Medicare Other | Admitting: Podiatry

## 2023-05-12 ENCOUNTER — Encounter: Payer: Self-pay | Admitting: Podiatry

## 2023-05-12 DIAGNOSIS — B351 Tinea unguium: Secondary | ICD-10-CM

## 2023-05-12 DIAGNOSIS — M79674 Pain in right toe(s): Secondary | ICD-10-CM

## 2023-05-12 DIAGNOSIS — E119 Type 2 diabetes mellitus without complications: Secondary | ICD-10-CM

## 2023-05-12 DIAGNOSIS — M79675 Pain in left toe(s): Secondary | ICD-10-CM | POA: Diagnosis not present

## 2023-05-12 NOTE — Progress Notes (Signed)
This patient returns to my office for at risk foot care.  This patient requires this care by a professional since this patient will be at risk due to having  Diabetes with kidney disease.  This patient is unable to cut nails herself since the patient cannot reach there  nails.These nails are painful walking and wearing shoes.  This patient presents for at risk foot care today.  General Appearance  Alert, conversant and in no acute stress.  Vascular  Dorsalis pedis are not  palpable  bilaterally. Posterior tibial pulses are not palpable due to swelling.  Capillary return is within normal limits  bilaterally. Temperature is within normal limits  Bilaterally.  Swelling  B/L.  Neurologic  Senn-Weinstein monofilament wire test within normal limits  bilaterally. Muscle power within normal limits bilaterally.  Nails Thick disfigured discolored nails with subungual debris  from hallux to fifth toes bilaterally. No evidence of bacterial infection or drainage bilaterally.Fifth toenail unattached fifth toe left foot.  Orthopedic  No limitations of motion  feet .  No crepitus or effusions noted.  No bony pathology or digital deformities noted.  Skin  normotropic skin with no porokeratosis noted bilaterally.  No signs of infections or ulcers noted.     Onychomycosis  Pain in right toes  Pain in left toes  Consent was obtained for treatment procedures.   Mechanical debridement of nails 1-5  bilaterally performed with a nail nipper.  Filed with dremel without incident. No infection or ulcer.    Return office visit   3 months        Told patient to return for periodic foot care and evaluation due to potential at risk complications.   Layson Bertsch DPM  

## 2023-05-29 DIAGNOSIS — C44311 Basal cell carcinoma of skin of nose: Secondary | ICD-10-CM | POA: Diagnosis not present

## 2023-08-07 DIAGNOSIS — E039 Hypothyroidism, unspecified: Secondary | ICD-10-CM | POA: Diagnosis not present

## 2023-08-07 DIAGNOSIS — H9113 Presbycusis, bilateral: Secondary | ICD-10-CM | POA: Diagnosis not present

## 2023-08-07 DIAGNOSIS — Z23 Encounter for immunization: Secondary | ICD-10-CM | POA: Diagnosis not present

## 2023-08-07 DIAGNOSIS — L219 Seborrheic dermatitis, unspecified: Secondary | ICD-10-CM | POA: Diagnosis not present

## 2023-08-07 DIAGNOSIS — Z6835 Body mass index (BMI) 35.0-35.9, adult: Secondary | ICD-10-CM | POA: Diagnosis not present

## 2023-08-07 DIAGNOSIS — D631 Anemia in chronic kidney disease: Secondary | ICD-10-CM | POA: Diagnosis not present

## 2023-08-07 DIAGNOSIS — I129 Hypertensive chronic kidney disease with stage 1 through stage 4 chronic kidney disease, or unspecified chronic kidney disease: Secondary | ICD-10-CM | POA: Diagnosis not present

## 2023-08-07 DIAGNOSIS — E1122 Type 2 diabetes mellitus with diabetic chronic kidney disease: Secondary | ICD-10-CM | POA: Diagnosis not present

## 2023-08-07 DIAGNOSIS — G720 Drug-induced myopathy: Secondary | ICD-10-CM | POA: Diagnosis not present

## 2023-08-07 DIAGNOSIS — M25511 Pain in right shoulder: Secondary | ICD-10-CM | POA: Diagnosis not present

## 2023-08-07 DIAGNOSIS — E785 Hyperlipidemia, unspecified: Secondary | ICD-10-CM | POA: Diagnosis not present

## 2023-08-07 DIAGNOSIS — N1832 Chronic kidney disease, stage 3b: Secondary | ICD-10-CM | POA: Diagnosis not present

## 2023-08-14 ENCOUNTER — Ambulatory Visit (INDEPENDENT_AMBULATORY_CARE_PROVIDER_SITE_OTHER): Payer: Medicare Other | Admitting: Podiatry

## 2023-08-14 ENCOUNTER — Encounter: Payer: Self-pay | Admitting: Podiatry

## 2023-08-14 DIAGNOSIS — E119 Type 2 diabetes mellitus without complications: Secondary | ICD-10-CM | POA: Diagnosis not present

## 2023-08-14 DIAGNOSIS — B351 Tinea unguium: Secondary | ICD-10-CM | POA: Diagnosis not present

## 2023-08-14 DIAGNOSIS — M79674 Pain in right toe(s): Secondary | ICD-10-CM

## 2023-08-14 DIAGNOSIS — M79675 Pain in left toe(s): Secondary | ICD-10-CM

## 2023-08-14 NOTE — Progress Notes (Signed)
This patient returns to my office for at risk foot care.  This patient requires this care by a professional since this patient will be at risk due to having  Diabetes with kidney disease.  This patient is unable to cut nails herself since the patient cannot reach there  nails.These nails are painful walking and wearing shoes.  This patient presents for at risk foot care today.  General Appearance  Alert, conversant and in no acute stress.  Vascular  Dorsalis pedis are not  palpable  bilaterally. Posterior tibial pulses are not palpable due to swelling.  Capillary return is within normal limits  bilaterally. Temperature is within normal limits  Bilaterally.  Swelling  B/L.  Neurologic  Senn-Weinstein monofilament wire test within normal limits  bilaterally. Muscle power within normal limits bilaterally.  Nails Thick disfigured discolored nails with subungual debris  from hallux to fifth toes bilaterally. No evidence of bacterial infection or drainage bilaterally.Fifth toenail unattached fifth toe left foot.  Orthopedic  No limitations of motion  feet .  No crepitus or effusions noted.  No bony pathology or digital deformities noted.  Skin  normotropic skin with no porokeratosis noted bilaterally.  No signs of infections or ulcers noted.     Onychomycosis  Pain in right toes  Pain in left toes  Consent was obtained for treatment procedures.   Mechanical debridement of nails 1-5  bilaterally performed with a nail nipper.  Filed with dremel without incident. No infection or ulcer.    Return office visit   3 months        Told patient to return for periodic foot care and evaluation due to potential at risk complications.   Helane Gunther DPM

## 2023-08-16 DIAGNOSIS — M25511 Pain in right shoulder: Secondary | ICD-10-CM | POA: Diagnosis not present

## 2023-08-16 DIAGNOSIS — M19011 Primary osteoarthritis, right shoulder: Secondary | ICD-10-CM | POA: Diagnosis not present

## 2023-08-24 DIAGNOSIS — H9313 Tinnitus, bilateral: Secondary | ICD-10-CM | POA: Diagnosis not present

## 2023-08-24 DIAGNOSIS — H906 Mixed conductive and sensorineural hearing loss, bilateral: Secondary | ICD-10-CM | POA: Diagnosis not present

## 2023-08-25 DIAGNOSIS — Z23 Encounter for immunization: Secondary | ICD-10-CM | POA: Diagnosis not present

## 2023-08-30 DIAGNOSIS — H401131 Primary open-angle glaucoma, bilateral, mild stage: Secondary | ICD-10-CM | POA: Diagnosis not present

## 2023-09-21 DIAGNOSIS — M19012 Primary osteoarthritis, left shoulder: Secondary | ICD-10-CM | POA: Diagnosis not present

## 2023-09-21 DIAGNOSIS — M19011 Primary osteoarthritis, right shoulder: Secondary | ICD-10-CM | POA: Diagnosis not present

## 2023-10-17 DIAGNOSIS — E1122 Type 2 diabetes mellitus with diabetic chronic kidney disease: Secondary | ICD-10-CM | POA: Diagnosis not present

## 2023-10-17 DIAGNOSIS — Z87891 Personal history of nicotine dependence: Secondary | ICD-10-CM | POA: Diagnosis not present

## 2023-10-17 DIAGNOSIS — Z9181 History of falling: Secondary | ICD-10-CM | POA: Diagnosis not present

## 2023-10-17 DIAGNOSIS — Z7984 Long term (current) use of oral hypoglycemic drugs: Secondary | ICD-10-CM | POA: Diagnosis not present

## 2023-10-17 DIAGNOSIS — D631 Anemia in chronic kidney disease: Secondary | ICD-10-CM | POA: Diagnosis not present

## 2023-10-17 DIAGNOSIS — N183 Chronic kidney disease, stage 3 unspecified: Secondary | ICD-10-CM | POA: Diagnosis not present

## 2023-10-17 DIAGNOSIS — M19011 Primary osteoarthritis, right shoulder: Secondary | ICD-10-CM | POA: Diagnosis not present

## 2023-10-17 DIAGNOSIS — I1310 Hypertensive heart and chronic kidney disease without heart failure, with stage 1 through stage 4 chronic kidney disease, or unspecified chronic kidney disease: Secondary | ICD-10-CM | POA: Diagnosis not present

## 2023-10-17 DIAGNOSIS — M810 Age-related osteoporosis without current pathological fracture: Secondary | ICD-10-CM | POA: Diagnosis not present

## 2023-10-17 DIAGNOSIS — I350 Nonrheumatic aortic (valve) stenosis: Secondary | ICD-10-CM | POA: Diagnosis not present

## 2023-10-17 DIAGNOSIS — M19012 Primary osteoarthritis, left shoulder: Secondary | ICD-10-CM | POA: Diagnosis not present

## 2023-10-17 DIAGNOSIS — E039 Hypothyroidism, unspecified: Secondary | ICD-10-CM | POA: Diagnosis not present

## 2023-10-20 DIAGNOSIS — N183 Chronic kidney disease, stage 3 unspecified: Secondary | ICD-10-CM | POA: Diagnosis not present

## 2023-10-20 DIAGNOSIS — E1122 Type 2 diabetes mellitus with diabetic chronic kidney disease: Secondary | ICD-10-CM | POA: Diagnosis not present

## 2023-10-20 DIAGNOSIS — I1310 Hypertensive heart and chronic kidney disease without heart failure, with stage 1 through stage 4 chronic kidney disease, or unspecified chronic kidney disease: Secondary | ICD-10-CM | POA: Diagnosis not present

## 2023-10-20 DIAGNOSIS — D631 Anemia in chronic kidney disease: Secondary | ICD-10-CM | POA: Diagnosis not present

## 2023-10-20 DIAGNOSIS — M19012 Primary osteoarthritis, left shoulder: Secondary | ICD-10-CM | POA: Diagnosis not present

## 2023-10-20 DIAGNOSIS — M19011 Primary osteoarthritis, right shoulder: Secondary | ICD-10-CM | POA: Diagnosis not present

## 2023-10-23 DIAGNOSIS — I1310 Hypertensive heart and chronic kidney disease without heart failure, with stage 1 through stage 4 chronic kidney disease, or unspecified chronic kidney disease: Secondary | ICD-10-CM | POA: Diagnosis not present

## 2023-10-23 DIAGNOSIS — E1122 Type 2 diabetes mellitus with diabetic chronic kidney disease: Secondary | ICD-10-CM | POA: Diagnosis not present

## 2023-10-23 DIAGNOSIS — M19012 Primary osteoarthritis, left shoulder: Secondary | ICD-10-CM | POA: Diagnosis not present

## 2023-10-23 DIAGNOSIS — N183 Chronic kidney disease, stage 3 unspecified: Secondary | ICD-10-CM | POA: Diagnosis not present

## 2023-10-23 DIAGNOSIS — D631 Anemia in chronic kidney disease: Secondary | ICD-10-CM | POA: Diagnosis not present

## 2023-10-23 DIAGNOSIS — M19011 Primary osteoarthritis, right shoulder: Secondary | ICD-10-CM | POA: Diagnosis not present

## 2023-10-27 DIAGNOSIS — M19011 Primary osteoarthritis, right shoulder: Secondary | ICD-10-CM | POA: Diagnosis not present

## 2023-10-27 DIAGNOSIS — M19012 Primary osteoarthritis, left shoulder: Secondary | ICD-10-CM | POA: Diagnosis not present

## 2023-10-27 DIAGNOSIS — I1310 Hypertensive heart and chronic kidney disease without heart failure, with stage 1 through stage 4 chronic kidney disease, or unspecified chronic kidney disease: Secondary | ICD-10-CM | POA: Diagnosis not present

## 2023-10-27 DIAGNOSIS — N183 Chronic kidney disease, stage 3 unspecified: Secondary | ICD-10-CM | POA: Diagnosis not present

## 2023-10-27 DIAGNOSIS — D631 Anemia in chronic kidney disease: Secondary | ICD-10-CM | POA: Diagnosis not present

## 2023-10-27 DIAGNOSIS — E1122 Type 2 diabetes mellitus with diabetic chronic kidney disease: Secondary | ICD-10-CM | POA: Diagnosis not present

## 2023-10-30 DIAGNOSIS — I1310 Hypertensive heart and chronic kidney disease without heart failure, with stage 1 through stage 4 chronic kidney disease, or unspecified chronic kidney disease: Secondary | ICD-10-CM | POA: Diagnosis not present

## 2023-10-30 DIAGNOSIS — D631 Anemia in chronic kidney disease: Secondary | ICD-10-CM | POA: Diagnosis not present

## 2023-10-30 DIAGNOSIS — M19012 Primary osteoarthritis, left shoulder: Secondary | ICD-10-CM | POA: Diagnosis not present

## 2023-10-30 DIAGNOSIS — E1122 Type 2 diabetes mellitus with diabetic chronic kidney disease: Secondary | ICD-10-CM | POA: Diagnosis not present

## 2023-10-30 DIAGNOSIS — N183 Chronic kidney disease, stage 3 unspecified: Secondary | ICD-10-CM | POA: Diagnosis not present

## 2023-10-30 DIAGNOSIS — M19011 Primary osteoarthritis, right shoulder: Secondary | ICD-10-CM | POA: Diagnosis not present

## 2023-11-03 DIAGNOSIS — D631 Anemia in chronic kidney disease: Secondary | ICD-10-CM | POA: Diagnosis not present

## 2023-11-03 DIAGNOSIS — N183 Chronic kidney disease, stage 3 unspecified: Secondary | ICD-10-CM | POA: Diagnosis not present

## 2023-11-03 DIAGNOSIS — M19011 Primary osteoarthritis, right shoulder: Secondary | ICD-10-CM | POA: Diagnosis not present

## 2023-11-03 DIAGNOSIS — I1310 Hypertensive heart and chronic kidney disease without heart failure, with stage 1 through stage 4 chronic kidney disease, or unspecified chronic kidney disease: Secondary | ICD-10-CM | POA: Diagnosis not present

## 2023-11-03 DIAGNOSIS — E1122 Type 2 diabetes mellitus with diabetic chronic kidney disease: Secondary | ICD-10-CM | POA: Diagnosis not present

## 2023-11-03 DIAGNOSIS — M19012 Primary osteoarthritis, left shoulder: Secondary | ICD-10-CM | POA: Diagnosis not present

## 2023-11-06 DIAGNOSIS — E039 Hypothyroidism, unspecified: Secondary | ICD-10-CM | POA: Diagnosis not present

## 2023-11-06 DIAGNOSIS — Z1331 Encounter for screening for depression: Secondary | ICD-10-CM | POA: Diagnosis not present

## 2023-11-06 DIAGNOSIS — E7800 Pure hypercholesterolemia, unspecified: Secondary | ICD-10-CM | POA: Diagnosis not present

## 2023-11-06 DIAGNOSIS — Z6834 Body mass index (BMI) 34.0-34.9, adult: Secondary | ICD-10-CM | POA: Diagnosis not present

## 2023-11-06 DIAGNOSIS — Z0000 Encounter for general adult medical examination without abnormal findings: Secondary | ICD-10-CM | POA: Diagnosis not present

## 2023-11-09 DIAGNOSIS — D631 Anemia in chronic kidney disease: Secondary | ICD-10-CM | POA: Diagnosis not present

## 2023-11-09 DIAGNOSIS — N183 Chronic kidney disease, stage 3 unspecified: Secondary | ICD-10-CM | POA: Diagnosis not present

## 2023-11-09 DIAGNOSIS — E1122 Type 2 diabetes mellitus with diabetic chronic kidney disease: Secondary | ICD-10-CM | POA: Diagnosis not present

## 2023-11-09 DIAGNOSIS — M19012 Primary osteoarthritis, left shoulder: Secondary | ICD-10-CM | POA: Diagnosis not present

## 2023-11-09 DIAGNOSIS — I1310 Hypertensive heart and chronic kidney disease without heart failure, with stage 1 through stage 4 chronic kidney disease, or unspecified chronic kidney disease: Secondary | ICD-10-CM | POA: Diagnosis not present

## 2023-11-09 DIAGNOSIS — M19011 Primary osteoarthritis, right shoulder: Secondary | ICD-10-CM | POA: Diagnosis not present

## 2023-11-15 ENCOUNTER — Ambulatory Visit (INDEPENDENT_AMBULATORY_CARE_PROVIDER_SITE_OTHER): Payer: Medicare Other | Admitting: Podiatry

## 2023-11-15 ENCOUNTER — Encounter: Payer: Self-pay | Admitting: Podiatry

## 2023-11-15 DIAGNOSIS — B351 Tinea unguium: Secondary | ICD-10-CM

## 2023-11-15 DIAGNOSIS — M79674 Pain in right toe(s): Secondary | ICD-10-CM | POA: Diagnosis not present

## 2023-11-15 DIAGNOSIS — M79675 Pain in left toe(s): Secondary | ICD-10-CM

## 2023-11-15 DIAGNOSIS — E119 Type 2 diabetes mellitus without complications: Secondary | ICD-10-CM

## 2023-11-15 NOTE — Progress Notes (Signed)
This patient returns to my office for at risk foot care.  This patient requires this care by a professional since this patient will be at risk due to having  Diabetes with kidney disease.  This patient is unable to cut nails herself since the patient cannot reach there  nails.These nails are painful walking and wearing shoes.  This patient presents for at risk foot care today.  General Appearance  Alert, conversant and in no acute stress.  Vascular  Dorsalis pedis are not  palpable  bilaterally. Posterior tibial pulses are not palpable due to swelling.  Capillary return is within normal limits  bilaterally. Temperature is within normal limits  Bilaterally.  Swelling  B/L.  Neurologic  Senn-Weinstein monofilament wire test within normal limits  bilaterally. Muscle power within normal limits bilaterally.  Nails Thick disfigured discolored nails with subungual debris  from hallux to fifth toes bilaterally. No evidence of bacterial infection or drainage bilaterally.Fifth toenail unattached fifth toe left foot.  Orthopedic  No limitations of motion  feet .  No crepitus or effusions noted.  No bony pathology or digital deformities noted.  Skin  normotropic skin with no porokeratosis noted bilaterally.  No signs of infections or ulcers noted.     Onychomycosis  Pain in right toes  Pain in left toes  Consent was obtained for treatment procedures.   Mechanical debridement of nails 1-5  bilaterally performed with a nail nipper.  Filed with dremel without incident. No infection or ulcer.    Return office visit   3 months        Told patient to return for periodic foot care and evaluation due to potential at risk complications.   Helane Gunther DPM

## 2023-11-16 DIAGNOSIS — E1122 Type 2 diabetes mellitus with diabetic chronic kidney disease: Secondary | ICD-10-CM | POA: Diagnosis not present

## 2023-11-16 DIAGNOSIS — D631 Anemia in chronic kidney disease: Secondary | ICD-10-CM | POA: Diagnosis not present

## 2023-11-16 DIAGNOSIS — M19011 Primary osteoarthritis, right shoulder: Secondary | ICD-10-CM | POA: Diagnosis not present

## 2023-11-16 DIAGNOSIS — Z87891 Personal history of nicotine dependence: Secondary | ICD-10-CM | POA: Diagnosis not present

## 2023-11-16 DIAGNOSIS — N183 Chronic kidney disease, stage 3 unspecified: Secondary | ICD-10-CM | POA: Diagnosis not present

## 2023-11-16 DIAGNOSIS — Z7984 Long term (current) use of oral hypoglycemic drugs: Secondary | ICD-10-CM | POA: Diagnosis not present

## 2023-11-16 DIAGNOSIS — I1310 Hypertensive heart and chronic kidney disease without heart failure, with stage 1 through stage 4 chronic kidney disease, or unspecified chronic kidney disease: Secondary | ICD-10-CM | POA: Diagnosis not present

## 2023-11-16 DIAGNOSIS — I350 Nonrheumatic aortic (valve) stenosis: Secondary | ICD-10-CM | POA: Diagnosis not present

## 2023-11-16 DIAGNOSIS — M810 Age-related osteoporosis without current pathological fracture: Secondary | ICD-10-CM | POA: Diagnosis not present

## 2023-11-16 DIAGNOSIS — E039 Hypothyroidism, unspecified: Secondary | ICD-10-CM | POA: Diagnosis not present

## 2023-11-16 DIAGNOSIS — Z9181 History of falling: Secondary | ICD-10-CM | POA: Diagnosis not present

## 2023-11-16 DIAGNOSIS — M19012 Primary osteoarthritis, left shoulder: Secondary | ICD-10-CM | POA: Diagnosis not present

## 2023-11-23 DIAGNOSIS — M19011 Primary osteoarthritis, right shoulder: Secondary | ICD-10-CM | POA: Diagnosis not present

## 2023-11-23 DIAGNOSIS — E1122 Type 2 diabetes mellitus with diabetic chronic kidney disease: Secondary | ICD-10-CM | POA: Diagnosis not present

## 2023-11-23 DIAGNOSIS — M19012 Primary osteoarthritis, left shoulder: Secondary | ICD-10-CM | POA: Diagnosis not present

## 2023-11-23 DIAGNOSIS — D631 Anemia in chronic kidney disease: Secondary | ICD-10-CM | POA: Diagnosis not present

## 2023-11-23 DIAGNOSIS — I1310 Hypertensive heart and chronic kidney disease without heart failure, with stage 1 through stage 4 chronic kidney disease, or unspecified chronic kidney disease: Secondary | ICD-10-CM | POA: Diagnosis not present

## 2023-11-23 DIAGNOSIS — N183 Chronic kidney disease, stage 3 unspecified: Secondary | ICD-10-CM | POA: Diagnosis not present

## 2024-01-10 ENCOUNTER — Encounter: Payer: Self-pay | Admitting: Podiatry

## 2024-01-10 ENCOUNTER — Ambulatory Visit (INDEPENDENT_AMBULATORY_CARE_PROVIDER_SITE_OTHER): Payer: Medicare Other | Admitting: Podiatry

## 2024-01-10 VITALS — Ht 66.0 in | Wt 264.0 lb

## 2024-01-10 DIAGNOSIS — E119 Type 2 diabetes mellitus without complications: Secondary | ICD-10-CM

## 2024-01-10 DIAGNOSIS — B351 Tinea unguium: Secondary | ICD-10-CM

## 2024-01-10 DIAGNOSIS — M79674 Pain in right toe(s): Secondary | ICD-10-CM

## 2024-01-10 DIAGNOSIS — M79675 Pain in left toe(s): Secondary | ICD-10-CM

## 2024-01-10 NOTE — Progress Notes (Signed)
 Patient presents to the office for an appointment but she returns too soon.  TRC 4 weeks.  GAM

## 2024-02-06 DIAGNOSIS — H9113 Presbycusis, bilateral: Secondary | ICD-10-CM | POA: Diagnosis not present

## 2024-02-06 DIAGNOSIS — G720 Drug-induced myopathy: Secondary | ICD-10-CM | POA: Diagnosis not present

## 2024-02-06 DIAGNOSIS — E785 Hyperlipidemia, unspecified: Secondary | ICD-10-CM | POA: Diagnosis not present

## 2024-02-06 DIAGNOSIS — E039 Hypothyroidism, unspecified: Secondary | ICD-10-CM | POA: Diagnosis not present

## 2024-02-06 DIAGNOSIS — E1122 Type 2 diabetes mellitus with diabetic chronic kidney disease: Secondary | ICD-10-CM | POA: Diagnosis not present

## 2024-02-06 DIAGNOSIS — I129 Hypertensive chronic kidney disease with stage 1 through stage 4 chronic kidney disease, or unspecified chronic kidney disease: Secondary | ICD-10-CM | POA: Diagnosis not present

## 2024-02-06 DIAGNOSIS — N1832 Chronic kidney disease, stage 3b: Secondary | ICD-10-CM | POA: Diagnosis not present

## 2024-02-15 ENCOUNTER — Ambulatory Visit (INDEPENDENT_AMBULATORY_CARE_PROVIDER_SITE_OTHER): Admitting: Podiatry

## 2024-02-15 ENCOUNTER — Encounter: Payer: Self-pay | Admitting: Podiatry

## 2024-02-15 DIAGNOSIS — E119 Type 2 diabetes mellitus without complications: Secondary | ICD-10-CM | POA: Diagnosis not present

## 2024-02-15 DIAGNOSIS — M79674 Pain in right toe(s): Secondary | ICD-10-CM

## 2024-02-15 DIAGNOSIS — N189 Chronic kidney disease, unspecified: Secondary | ICD-10-CM

## 2024-02-15 DIAGNOSIS — M79675 Pain in left toe(s): Secondary | ICD-10-CM

## 2024-02-15 DIAGNOSIS — B351 Tinea unguium: Secondary | ICD-10-CM

## 2024-02-15 NOTE — Progress Notes (Signed)
This patient returns to my office for at risk foot care.  This patient requires this care by a professional since this patient will be at risk due to having  Diabetes with kidney disease.  This patient is unable to cut nails herself since the patient cannot reach there  nails.These nails are painful walking and wearing shoes.  This patient presents for at risk foot care today.  General Appearance  Alert, conversant and in no acute stress.  Vascular  Dorsalis pedis are not  palpable  bilaterally. Posterior tibial pulses are not palpable due to swelling.  Capillary return is within normal limits  bilaterally. Temperature is within normal limits  Bilaterally.  Swelling  B/L.  Neurologic  Senn-Weinstein monofilament wire test within normal limits  bilaterally. Muscle power within normal limits bilaterally.  Nails Thick disfigured discolored nails with subungual debris  from hallux to fifth toes bilaterally. No evidence of bacterial infection or drainage bilaterally.Fifth toenail unattached fifth toe left foot.  Orthopedic  No limitations of motion  feet .  No crepitus or effusions noted.  No bony pathology or digital deformities noted.  Skin  normotropic skin with no porokeratosis noted bilaterally.  No signs of infections or ulcers noted.     Onychomycosis  Pain in right toes  Pain in left toes  Consent was obtained for treatment procedures.   Mechanical debridement of nails 1-5  bilaterally performed with a nail nipper.  Filed with dremel without incident. No infection or ulcer.    Return office visit   3 months        Told patient to return for periodic foot care and evaluation due to potential at risk complications.   Helane Gunther DPM

## 2024-02-28 DIAGNOSIS — H5213 Myopia, bilateral: Secondary | ICD-10-CM | POA: Diagnosis not present

## 2024-02-28 DIAGNOSIS — H353131 Nonexudative age-related macular degeneration, bilateral, early dry stage: Secondary | ICD-10-CM | POA: Diagnosis not present

## 2024-02-28 DIAGNOSIS — Z961 Presence of intraocular lens: Secondary | ICD-10-CM | POA: Diagnosis not present

## 2024-02-28 DIAGNOSIS — H401131 Primary open-angle glaucoma, bilateral, mild stage: Secondary | ICD-10-CM | POA: Diagnosis not present

## 2024-02-28 DIAGNOSIS — E119 Type 2 diabetes mellitus without complications: Secondary | ICD-10-CM | POA: Diagnosis not present

## 2024-03-05 DIAGNOSIS — E669 Obesity, unspecified: Secondary | ICD-10-CM | POA: Diagnosis not present

## 2024-03-05 DIAGNOSIS — H9113 Presbycusis, bilateral: Secondary | ICD-10-CM | POA: Diagnosis not present

## 2024-03-05 DIAGNOSIS — I10 Essential (primary) hypertension: Secondary | ICD-10-CM | POA: Diagnosis not present

## 2024-03-05 DIAGNOSIS — G4733 Obstructive sleep apnea (adult) (pediatric): Secondary | ICD-10-CM | POA: Diagnosis not present

## 2024-03-05 DIAGNOSIS — Z9989 Dependence on other enabling machines and devices: Secondary | ICD-10-CM | POA: Diagnosis not present

## 2024-04-01 DIAGNOSIS — M25512 Pain in left shoulder: Secondary | ICD-10-CM | POA: Diagnosis not present

## 2024-04-01 DIAGNOSIS — G8929 Other chronic pain: Secondary | ICD-10-CM | POA: Diagnosis not present

## 2024-04-01 DIAGNOSIS — M25511 Pain in right shoulder: Secondary | ICD-10-CM | POA: Diagnosis not present

## 2024-04-05 DIAGNOSIS — G4733 Obstructive sleep apnea (adult) (pediatric): Secondary | ICD-10-CM | POA: Diagnosis not present

## 2024-05-20 DIAGNOSIS — E1122 Type 2 diabetes mellitus with diabetic chronic kidney disease: Secondary | ICD-10-CM | POA: Diagnosis not present

## 2024-05-22 ENCOUNTER — Ambulatory Visit (INDEPENDENT_AMBULATORY_CARE_PROVIDER_SITE_OTHER): Admitting: Podiatry

## 2024-05-22 ENCOUNTER — Encounter: Payer: Self-pay | Admitting: Podiatry

## 2024-05-22 DIAGNOSIS — M79674 Pain in right toe(s): Secondary | ICD-10-CM | POA: Diagnosis not present

## 2024-05-22 DIAGNOSIS — N189 Chronic kidney disease, unspecified: Secondary | ICD-10-CM | POA: Diagnosis not present

## 2024-05-22 DIAGNOSIS — B351 Tinea unguium: Secondary | ICD-10-CM | POA: Diagnosis not present

## 2024-05-22 DIAGNOSIS — E119 Type 2 diabetes mellitus without complications: Secondary | ICD-10-CM | POA: Diagnosis not present

## 2024-05-22 DIAGNOSIS — M79675 Pain in left toe(s): Secondary | ICD-10-CM | POA: Diagnosis not present

## 2024-05-22 NOTE — Progress Notes (Signed)
This patient returns to my office for at risk foot care.  This patient requires this care by a professional since this patient will be at risk due to having  Diabetes with kidney disease.  This patient is unable to cut nails herself since the patient cannot reach there  nails.These nails are painful walking and wearing shoes.  This patient presents for at risk foot care today.  General Appearance  Alert, conversant and in no acute stress.  Vascular  Dorsalis pedis are not  palpable  bilaterally. Posterior tibial pulses are not palpable due to swelling.  Capillary return is within normal limits  bilaterally. Temperature is within normal limits  Bilaterally.  Swelling  B/L.  Neurologic  Senn-Weinstein monofilament wire test within normal limits  bilaterally. Muscle power within normal limits bilaterally.  Nails Thick disfigured discolored nails with subungual debris  from hallux to fifth toes bilaterally. No evidence of bacterial infection or drainage bilaterally.Fifth toenail unattached fifth toe left foot.  Orthopedic  No limitations of motion  feet .  No crepitus or effusions noted.  No bony pathology or digital deformities noted.  Skin  normotropic skin with no porokeratosis noted bilaterally.  No signs of infections or ulcers noted.     Onychomycosis  Pain in right toes  Pain in left toes  Consent was obtained for treatment procedures.   Mechanical debridement of nails 1-5  bilaterally performed with a nail nipper.  Filed with dremel without incident. No infection or ulcer.    Return office visit   3 months        Told patient to return for periodic foot care and evaluation due to potential at risk complications.   Helane Gunther DPM

## 2024-07-16 DIAGNOSIS — G4733 Obstructive sleep apnea (adult) (pediatric): Secondary | ICD-10-CM | POA: Diagnosis not present

## 2024-07-31 DIAGNOSIS — H906 Mixed conductive and sensorineural hearing loss, bilateral: Secondary | ICD-10-CM | POA: Diagnosis not present

## 2024-08-05 DIAGNOSIS — M25512 Pain in left shoulder: Secondary | ICD-10-CM | POA: Diagnosis not present

## 2024-08-05 DIAGNOSIS — G8929 Other chronic pain: Secondary | ICD-10-CM | POA: Diagnosis not present

## 2024-08-05 DIAGNOSIS — M25511 Pain in right shoulder: Secondary | ICD-10-CM | POA: Diagnosis not present

## 2024-08-13 DIAGNOSIS — G720 Drug-induced myopathy: Secondary | ICD-10-CM | POA: Diagnosis not present

## 2024-08-13 DIAGNOSIS — I129 Hypertensive chronic kidney disease with stage 1 through stage 4 chronic kidney disease, or unspecified chronic kidney disease: Secondary | ICD-10-CM | POA: Diagnosis not present

## 2024-08-13 DIAGNOSIS — E785 Hyperlipidemia, unspecified: Secondary | ICD-10-CM | POA: Diagnosis not present

## 2024-08-13 DIAGNOSIS — E669 Obesity, unspecified: Secondary | ICD-10-CM | POA: Diagnosis not present

## 2024-08-13 DIAGNOSIS — E1122 Type 2 diabetes mellitus with diabetic chronic kidney disease: Secondary | ICD-10-CM | POA: Diagnosis not present

## 2024-08-13 DIAGNOSIS — Z23 Encounter for immunization: Secondary | ICD-10-CM | POA: Diagnosis not present

## 2024-08-13 DIAGNOSIS — N1832 Chronic kidney disease, stage 3b: Secondary | ICD-10-CM | POA: Diagnosis not present

## 2024-08-13 DIAGNOSIS — E039 Hypothyroidism, unspecified: Secondary | ICD-10-CM | POA: Diagnosis not present

## 2024-08-23 ENCOUNTER — Ambulatory Visit: Admitting: Podiatry

## 2024-08-26 ENCOUNTER — Ambulatory Visit (INDEPENDENT_AMBULATORY_CARE_PROVIDER_SITE_OTHER): Admitting: Podiatry

## 2024-08-26 ENCOUNTER — Encounter: Payer: Self-pay | Admitting: Podiatry

## 2024-08-26 DIAGNOSIS — B351 Tinea unguium: Secondary | ICD-10-CM

## 2024-08-26 DIAGNOSIS — M79674 Pain in right toe(s): Secondary | ICD-10-CM | POA: Diagnosis not present

## 2024-08-26 DIAGNOSIS — M79675 Pain in left toe(s): Secondary | ICD-10-CM | POA: Diagnosis not present

## 2024-08-26 DIAGNOSIS — N189 Chronic kidney disease, unspecified: Secondary | ICD-10-CM

## 2024-08-26 DIAGNOSIS — E119 Type 2 diabetes mellitus without complications: Secondary | ICD-10-CM | POA: Diagnosis not present

## 2024-08-26 NOTE — Progress Notes (Signed)
 This patient returns to my office for at risk foot care.  This patient requires this care by a professional since this patient will be at risk due to having  Diabetes with kidney disease.  This patient is unable to cut nails herself since the patient cannot reach there  nails.These nails are painful walking and wearing shoes.  This patient presents for at risk foot care today.  General Appearance  Alert, conversant and in no acute stress.  Vascular  Dorsalis pedis are not  palpable  bilaterally. Posterior tibial pulses are not palpable due to swelling.  Capillary return is within normal limits  bilaterally. Temperature is within normal limits  Bilaterally.  Swelling  B/L.  Neurologic  Senn-Weinstein monofilament wire test within normal limits  bilaterally. Muscle power within normal limits bilaterally.  Nails Thick disfigured discolored nails with subungual debris  from hallux to fifth toes bilaterally. No evidence of bacterial infection or drainage bilaterally.  Orthopedic  No limitations of motion  feet .  No crepitus or effusions noted.  No bony pathology or digital deformities noted.  Skin  normotropic skin with no porokeratosis noted bilaterally.  No signs of infections or ulcers noted.     Onychomycosis  Pain in right toes  Pain in left toes  Consent was obtained for treatment procedures.   Mechanical debridement of nails 1-5  bilaterally performed with a nail nipper.  Filed with dremel without incident. No infection or ulcer.    Return office visit   3 months        Told patient to return for periodic foot care and evaluation due to potential at risk complications.   Cordella Bold DPM

## 2024-08-28 DIAGNOSIS — H1789 Other corneal scars and opacities: Secondary | ICD-10-CM | POA: Diagnosis not present

## 2024-08-28 DIAGNOSIS — H401131 Primary open-angle glaucoma, bilateral, mild stage: Secondary | ICD-10-CM | POA: Diagnosis not present

## 2024-08-28 DIAGNOSIS — Z961 Presence of intraocular lens: Secondary | ICD-10-CM | POA: Diagnosis not present

## 2024-12-23 ENCOUNTER — Ambulatory Visit: Admitting: Podiatry
